# Patient Record
Sex: Female | Born: 1935 | Race: White | Hispanic: No | Marital: Single | Smoking: Former smoker
Health system: Southern US, Community
[De-identification: ages and names within clinical notes are randomized; demographics above are authoritative.]

## PROBLEM LIST (undated history)

## (undated) DIAGNOSIS — J302 Other seasonal allergic rhinitis: Secondary | ICD-10-CM

## (undated) DIAGNOSIS — E119 Type 2 diabetes mellitus without complications: Secondary | ICD-10-CM

## (undated) DIAGNOSIS — Z8489 Family history of other specified conditions: Secondary | ICD-10-CM

## (undated) DIAGNOSIS — M1990 Unspecified osteoarthritis, unspecified site: Secondary | ICD-10-CM

## (undated) DIAGNOSIS — I4892 Unspecified atrial flutter: Secondary | ICD-10-CM

## (undated) DIAGNOSIS — I38 Endocarditis, valve unspecified: Secondary | ICD-10-CM

## (undated) DIAGNOSIS — K219 Gastro-esophageal reflux disease without esophagitis: Secondary | ICD-10-CM

## (undated) DIAGNOSIS — I10 Essential (primary) hypertension: Secondary | ICD-10-CM

## (undated) DIAGNOSIS — E7800 Pure hypercholesterolemia, unspecified: Secondary | ICD-10-CM

## (undated) DIAGNOSIS — R198 Other specified symptoms and signs involving the digestive system and abdomen: Secondary | ICD-10-CM

## (undated) DIAGNOSIS — R0600 Dyspnea, unspecified: Secondary | ICD-10-CM

## (undated) DIAGNOSIS — M8580 Other specified disorders of bone density and structure, unspecified site: Secondary | ICD-10-CM

## (undated) HISTORY — PX: CHOLECYSTECTOMY: SHX55

## (undated) HISTORY — PX: LAPAROSCOPIC CHOLECYSTECTOMY: SUR755

## (undated) HISTORY — PX: TONSILLECTOMY: SUR1361

---
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
## 2013-01-07 ENCOUNTER — Encounter (HOSPITAL_COMMUNITY): Payer: Self-pay | Admitting: Pharmacy Technician

## 2013-01-07 NOTE — Patient Instructions (Addendum)
Lori David  01/08/2013   Your procedure is scheduled on:  01/13/13  Report to [facility] at 07:30 AM.  Call this number if you have problems the morning of surgery: [phone]   Remember:   Do not eat food or drink liquids after midnight.   Take these medicines the morning of surgery with A SIP OF WATER: Fexofenadine, Ranitidine and Valsartan-HCTZ.   Do not wear jewelry, make-up or nail polish.  Do not wear lotions, powders, or perfumes. You may wear deodorant.  Do not shave 48 hours prior to surgery. Men may shave face and neck.  Do not bring valuables to the hospital.  [facility] is not responsible for any belongings or valuables.               Contacts, dentures or bridgework may not be worn into surgery.  Leave suitcase in the car. After surgery it may be brought to your room.  For patients admitted to the hospital, discharge time is determined by your treatment team.               Patients discharged the day of surgery will not be allowed to drive home.   Special Instructions: Start using your eye drops prior to surgery as directed by your eye doctor.   Please read over the following fact sheets that you were given: Anesthesia Post-op Instructions     Cataract Surgery  A cataract is a clouding of the lens of the eye. When a lens becomes cloudy, vision is reduced based on the degree and nature of the clouding. Surgery may be needed to improve vision. Surgery removes the cloudy lens and usually replaces it with a substitute lens (intraocular lens, IOL). LET YOUR EYE DOCTOR KNOW ABOUT:  Allergies to food or medicine.  Medicines taken including herbs, eyedrops, over-the-counter medicines, and creams.  Use of steroids (by mouth or creams).  Previous problems with anesthetics or numbing medicine.  History of bleeding problems or blood clots.  Previous surgery.  Other health problems, including diabetes and kidney problems.  Possibility of pregnancy, if this  applies. RISKS AND COMPLICATIONS  Infection.  Inflammation of the eyeball (endophthalmitis) that can spread to both eyes (sympathetic ophthalmia).  Poor wound healing.  If an IOL is inserted, it can later fall out of proper position. This is very uncommon.  Clouding of the part of your eye that holds an IOL in place. This is called an "after-cataract." These are uncommon, but easily treated. BEFORE THE PROCEDURE  Do not eat or drink anything except small amounts of water for 8 to 12 before your surgery, or as directed by your caregiver.  Unless you are told otherwise, continue any eyedrops you have been prescribed.  Talk to your primary caregiver about all other medicines that you take (both prescription and non-prescription). In some cases, you may need to stop or change medicines near the time of your surgery. This is most important if you are taking blood-thinning medicine.Do not stop medicines unless you are told to do so.  Arrange for someone to drive you to and from the procedure.  Do not put contact lenses in either eye on the day of your surgery. PROCEDURE There is more than one method for safely removing a cataract. Your doctor can explain the differences and help determine which is best for you. Phacoemulsification surgery is the most common form of cataract surgery.  An injection is given behind the eye or eyedrops are given to make this  a painless procedure.  A small cut (incision) is made on the edge of the clear, dome-shaped surface that covers the front of the eye (cornea).  A tiny probe is painlessly inserted into the eye. This device gives off ultrasound waves that soften and break up the cloudy center of the lens. This makes it easier for the cloudy lens to be removed by suction.  An IOL may be implanted.  The normal lens of the eye is covered by a clear capsule. Part of that capsule is intentionally left in the eye to support the IOL.  Your surgeon may or may  not use stitches to close the incision. There are other forms of cataract surgery that require a larger incision and stiches to close the eye. This approach is taken in cases where the doctor feels that the cataract cannot be easily removed using phacoemulsification. AFTER THE PROCEDURE  When an IOL is implanted, it does not need care. It becomes a permanent part of your eye and cannot be seen or felt.  Your doctor will schedule follow-up exams to check on your progress.  Review your other medicines with your doctor to see which can be resumed after surgery.  Use eyedrops or take medicine as prescribed by your doctor. Document Released: 01/25/2011 Document Revised: 04/30/2011 Document Reviewed: 01/25/2011 [facility] Patient Information 2014 [place], [place].   PATIENT INSTRUCTIONS POST-ANESTHESIA  IMMEDIATELY FOLLOWING SURGERY:  Do not drive or operate machinery for the first twenty four hours after surgery.  Do not make any important decisions for twenty four hours after surgery or while taking narcotic pain medications or sedatives.  If you develop intractable nausea and vomiting or a severe headache please notify your doctor immediately.  FOLLOW-UP:  Please make an appointment with your surgeon as instructed. You do not need to follow up with anesthesia unless specifically instructed to do so.  WOUND CARE INSTRUCTIONS (if applicable):  Keep a dry clean dressing on the anesthesia/puncture wound site if there is drainage.  Once the wound has quit draining you may leave it open to air.  Generally you should leave the bandage intact for twenty four hours unless there is drainage.  If the epidural site drains for more than 36-48 hours please call the anesthesia department.  QUESTIONS?:  Please feel free to call your physician or the hospital operator if you have any questions, and they will be happy to assist you.

## 2013-01-08 ENCOUNTER — Encounter (HOSPITAL_COMMUNITY): Payer: Self-pay

## 2013-01-08 ENCOUNTER — Encounter (HOSPITAL_COMMUNITY)
Admission: RE | Admit: 2013-01-08 | Discharge: 2013-01-08 | Disposition: A | Discharge status: Home / Self Care | Payer: Medicare Other | Source: Ambulatory Visit | Attending: Ophthalmology | Admitting: Ophthalmology

## 2013-01-08 DIAGNOSIS — Z01818 Encounter for other preprocedural examination: Secondary | ICD-10-CM | POA: Insufficient documentation

## 2013-01-08 DIAGNOSIS — Z01812 Encounter for preprocedural laboratory examination: Secondary | ICD-10-CM | POA: Insufficient documentation

## 2013-01-08 HISTORY — DX: Other seasonal allergic rhinitis: J30.2

## 2013-01-08 HISTORY — DX: Essential (primary) hypertension: I10

## 2013-01-08 HISTORY — DX: Gastro-esophageal reflux disease without esophagitis: K21.9

## 2013-01-08 LAB — CBC WITH DIFFERENTIAL/PLATELET
Basophils Relative: 2 % — ABNORMAL HIGH (ref 0–1)
Eosinophils Absolute: 0 10*3/uL (ref 0.0–0.7)
Eosinophils Relative: 1 % (ref 0–5)
Hemoglobin: 15.1 g/dL — ABNORMAL HIGH (ref 12.0–15.0)
Lymphs Abs: 1.2 10*3/uL (ref 0.7–4.0)
MCH: 29.5 pg (ref 26.0–34.0)
MCHC: 33.3 g/dL (ref 30.0–36.0)
Monocytes Relative: 10 % (ref 3–12)
Neutro Abs: 2.9 10*3/uL (ref 1.7–7.7)
Neutrophils Relative %: 62 % (ref 43–77)
RBC: 5.12 MIL/uL — ABNORMAL HIGH (ref 3.87–5.11)
RDW: 13.3 % (ref 11.5–15.5)

## 2013-01-08 LAB — BASIC METABOLIC PANEL
BUN: 7 mg/dL (ref 6–23)
Calcium: 10.2 mg/dL (ref 8.4–10.5)
Creatinine, Ser: 0.81 mg/dL (ref 0.50–1.10)
GFR calc Af Amer: 80 mL/min — ABNORMAL LOW (ref >90)
GFR calc non Af Amer: 69 mL/min — ABNORMAL LOW (ref >90)
Glucose, Bld: 80 mg/dL (ref 70–99)
Potassium: 4.3 mEq/L (ref 3.5–5.1)

## 2013-01-08 NOTE — Progress Notes (Signed)
Pt came in for pre-op appt for EKG showed Atrial Flutter, EKG shows HR of 148. HR done manually and was irregular and between 70-80 bpm. Pt is asymptomatic, no dizziness, lightheadedness, heart palpitations. Pt's BP 128/85, HR 75, O2 sat 98%. Dr. Jayme Cloud notified and said to cancel surgery and to let pt's PCP and Dr. Nile Riggs aware that pt needs urgent cardiology appt. Dr. Bonnita Levan office called, I spoke with Denman George, med tech (there aren't any nurses within his office) and notified her of pt's situation and the need for pt to be seen by Dr. Sherril Croon and cardiology urgently. Pt's EKG was faxed to Dr. Sherril Croon' office. Dr. Ashley Royalty office called and a message was left for the surgery scheduler about surgery being canceled and the pt's situation.

## 2013-01-13 ENCOUNTER — Encounter (HOSPITAL_COMMUNITY): Admission: RE | Payer: Self-pay | Source: Ambulatory Visit

## 2013-01-13 ENCOUNTER — Ambulatory Visit (HOSPITAL_COMMUNITY): Admission: RE | Admit: 2013-01-13 | Payer: Medicare Other | Source: Ambulatory Visit | Admitting: Ophthalmology

## 2013-01-13 SURGERY — PHACOEMULSIFICATION, CATARACT, WITH IOL INSERTION
Anesthesia: Monitor Anesthesia Care | Laterality: Right

## 2013-01-20 ENCOUNTER — Other Ambulatory Visit (HOSPITAL_COMMUNITY): Payer: Self-pay

## 2013-01-27 ENCOUNTER — Encounter (HOSPITAL_COMMUNITY): Admission: RE | Payer: Self-pay | Source: Ambulatory Visit

## 2013-01-27 ENCOUNTER — Ambulatory Visit (HOSPITAL_COMMUNITY): Admission: RE | Admit: 2013-01-27 | Payer: Medicare Other | Source: Ambulatory Visit | Admitting: Ophthalmology

## 2013-01-27 SURGERY — PHACOEMULSIFICATION, CATARACT, WITH IOL INSERTION
Anesthesia: Monitor Anesthesia Care | Laterality: Left

## 2013-02-19 HISTORY — PX: CATARACT EXTRACTION W/ INTRAOCULAR LENS  IMPLANT, BILATERAL: SHX1307

## 2013-03-09 DIAGNOSIS — I10 Essential (primary) hypertension: Secondary | ICD-10-CM | POA: Insufficient documentation

## 2013-03-09 DIAGNOSIS — I4892 Unspecified atrial flutter: Secondary | ICD-10-CM | POA: Insufficient documentation

## 2013-03-09 DIAGNOSIS — I429 Cardiomyopathy, unspecified: Secondary | ICD-10-CM | POA: Insufficient documentation

## 2013-06-22 NOTE — Patient Instructions (Signed)
Lori David  06/22/2013   Your procedure is scheduled on:  06/30/2013  Report to [facility] at 6:30 AM.  Call this number if you have problems the morning of surgery: [phone]   Remember:   Do not eat food or drink liquids after midnight.   Take these medicines the morning of surgery with A SIP OF WATER: Allegra, Valsartan, Zantac   Do not wear jewelry, make-up or nail polish.  Do not wear lotions, powders, or perfumes. You may wear deodorant.  Do not shave 48 hours prior to surgery. Men may shave face and neck.  Do not bring valuables to the hospital.  [facility] is not responsible for any belongings or valuables.               Contacts, dentures or bridgework may not be worn into surgery.  Leave suitcase in the car. After surgery it may be brought to your room.  For patients admitted to the hospital, discharge time is determined by your treatment team.               Patients discharged the day of surgery will not be allowed to drive home.  Name and phone number of your driver:   Special Instructions: Shower using CHG 2 nights before surgery and the night before surgery.  If you shower the day of surgery use CHG.  Use special wash - you have one bottle of CHG for all showers.  You should use approximately 1/3 of the bottle for each shower.   Please read over the following fact sheets that you were given: Pain Booklet, Coughing and Deep Breathing, Surgical Site Infection Prevention, Anesthesia Post-op Instructions and Care and Recovery After Surgery  General Anesthesia, Adult, Care After Refer to this sheet in the next few weeks. These instructions provide you with information on caring for yourself after your procedure. Your health care provider may also give you more specific instructions. Your treatment has been planned according to current medical practices, but problems sometimes occur. Call your health care provider if you have any problems or questions after your  procedure. WHAT TO EXPECT AFTER THE PROCEDURE After the procedure, it is typical to experience:  Sleepiness.  Nausea and vomiting. HOME CARE INSTRUCTIONS  For the first 24 hours after general anesthesia:  Have a responsible person with you.  Do not drive a car. If you are alone, do not take public transportation.  Do not drink alcohol.  Do not take medicine that has not been prescribed by your health care provider.  Do not sign important papers or make important decisions.  You may resume a normal diet and activities as directed by your health care provider.  Change bandages (dressings) as directed.  If you have questions or problems that seem related to general anesthesia, call the hospital and ask for the anesthetist or anesthesiologist on call. SEEK MEDICAL CARE IF:  You have nausea and vomiting that continue the day after anesthesia.  You develop a rash. SEEK IMMEDIATE MEDICAL CARE IF:   You have difficulty breathing.  You have chest pain.  You have any allergic problems. Document Released: 05/14/2000 Document Revised: 10/08/2012 Document Reviewed: 08/21/2012 [facility] Patient Information 2014 [place], [place].  Lori David  06/22/2013           [place] Instructions [place] [address]      1. Avoid closing eyes tightly. One often closes the eye tightly when laughing, talking, sneezing, coughing  or if they feel irritated. At these times, you should be careful not to close your eyes tightly.  2. Instill eye drops as instructed. To instill drops in your eye, open it, look up and have someone gently pull the lower lid down and instill a couple of drops inside the lower lid.  3. Do not touch upper lid.  4. Take Advil or Tylenol for pain.  5. You may use either eye for near work, such as reading or sewing and you may watch television.  6. You may have your hair done at the beauty parlor at any time.  7. Wear  dark glasses with or without your own glasses if you are in bright light.  8. Call our office at [phone] or [phone] if you have sharp pain in your eye or unusual symptoms.  9. Do not be concerned because vision in the operative eye is not good. It will not be good, no matter how successful the operation, until you get a special lens for it. Your old glasses will not be suited to the new eye that was operated on and you will not be ready for a new lens for about a month.  10. Follow up at the [facility] office.    I have received a copy of the above instructions and will follow them.

## 2013-06-23 ENCOUNTER — Encounter (HOSPITAL_COMMUNITY)
Admission: RE | Admit: 2013-06-23 | Discharge: 2013-06-23 | Disposition: A | Discharge status: Home / Self Care | Payer: Medicare HMO | Source: Ambulatory Visit | Attending: Ophthalmology | Admitting: Ophthalmology

## 2013-06-23 ENCOUNTER — Other Ambulatory Visit: Payer: Self-pay

## 2013-06-23 ENCOUNTER — Encounter (HOSPITAL_COMMUNITY): Payer: Self-pay

## 2013-06-23 DIAGNOSIS — Z01812 Encounter for preprocedural laboratory examination: Secondary | ICD-10-CM | POA: Insufficient documentation

## 2013-06-23 DIAGNOSIS — Z01810 Encounter for preprocedural cardiovascular examination: Secondary | ICD-10-CM | POA: Insufficient documentation

## 2013-06-23 HISTORY — DX: Endocarditis, valve unspecified: I38

## 2013-06-23 LAB — CBC WITH DIFFERENTIAL/PLATELET
Basophils Absolute: 0.1 10*3/uL (ref 0.0–0.1)
Basophils Relative: 1 % (ref 0–1)
EOS ABS: 0.1 10*3/uL (ref 0.0–0.7)
Eosinophils Relative: 2 % (ref 0–5)
HEMATOCRIT: 41.8 % (ref 36.0–46.0)
Hemoglobin: 14.4 g/dL (ref 12.0–15.0)
LYMPHS ABS: 1.2 10*3/uL (ref 0.7–4.0)
LYMPHS PCT: 21 % (ref 12–46)
MCH: 30.6 pg (ref 26.0–34.0)
MCHC: 34.4 g/dL (ref 30.0–36.0)
MCV: 88.7 fL (ref 78.0–100.0)
MONO ABS: 0.6 10*3/uL (ref 0.1–1.0)
MONOS PCT: 10 % (ref 3–12)
Neutro Abs: 3.7 10*3/uL (ref 1.7–7.7)
Neutrophils Relative %: 66 % (ref 43–77)
Platelets: 237 10*3/uL (ref 150–400)
RBC: 4.71 MIL/uL (ref 3.87–5.11)
RDW: 13.1 % (ref 11.5–15.5)
WBC: 5.7 10*3/uL (ref 4.0–10.5)

## 2013-06-23 LAB — BASIC METABOLIC PANEL
BUN: 9 mg/dL (ref 6–23)
CALCIUM: 9.7 mg/dL (ref 8.4–10.5)
CO2: 28 meq/L (ref 19–32)
CREATININE: 0.73 mg/dL (ref 0.50–1.10)
Chloride: 101 mEq/L (ref 96–112)
GFR calc Af Amer: >90 mL/min (ref >90)
GFR, EST NON AFRICAN AMERICAN: 81 mL/min — AB (ref >90)
GLUCOSE: 82 mg/dL (ref 70–99)
Potassium: 4.7 mEq/L (ref 3.7–5.3)
SODIUM: 139 meq/L (ref 137–147)

## 2013-06-25 ENCOUNTER — Other Ambulatory Visit (HOSPITAL_COMMUNITY): Payer: Medicare Other

## 2013-06-25 ENCOUNTER — Encounter (HOSPITAL_COMMUNITY): Payer: Self-pay | Admitting: Pharmacy Technician

## 2013-06-30 ENCOUNTER — Encounter (HOSPITAL_COMMUNITY): Payer: Self-pay

## 2013-06-30 ENCOUNTER — Encounter (HOSPITAL_COMMUNITY)
Admission: RE | Disposition: A | Discharge status: Home / Self Care | Payer: Self-pay | Source: Ambulatory Visit | Attending: Ophthalmology

## 2013-06-30 ENCOUNTER — Ambulatory Visit (HOSPITAL_COMMUNITY)
Admission: RE | Admit: 2013-06-30 | Discharge: 2013-06-30 | Disposition: A | Discharge status: Home / Self Care | Payer: Medicare HMO | Source: Ambulatory Visit | Attending: Ophthalmology | Admitting: Ophthalmology

## 2013-06-30 ENCOUNTER — Ambulatory Visit (HOSPITAL_COMMUNITY): Payer: Medicare HMO | Admitting: Anesthesiology

## 2013-06-30 ENCOUNTER — Encounter (HOSPITAL_COMMUNITY): Payer: Medicare HMO | Admitting: Anesthesiology

## 2013-06-30 DIAGNOSIS — H2510 Age-related nuclear cataract, unspecified eye: Secondary | ICD-10-CM | POA: Insufficient documentation

## 2013-06-30 DIAGNOSIS — I10 Essential (primary) hypertension: Secondary | ICD-10-CM | POA: Insufficient documentation

## 2013-06-30 DIAGNOSIS — Z79899 Other long term (current) drug therapy: Secondary | ICD-10-CM | POA: Insufficient documentation

## 2013-06-30 HISTORY — PX: CATARACT EXTRACTION W/PHACO: SHX586

## 2013-06-30 SURGERY — PHACOEMULSIFICATION, CATARACT, WITH IOL INSERTION
Anesthesia: Monitor Anesthesia Care | Site: Eye | Laterality: Right

## 2013-06-30 MED ORDER — CYCLOPENTOLATE-PHENYLEPHRINE OP SOLN OPTIME - NO CHARGE
OPHTHALMIC | Status: AC
  Filled 2013-06-30: qty 2

## 2013-06-30 MED ORDER — TETRACAINE 0.5 % OP SOLN OPTIME - NO CHARGE
OPHTHALMIC | Status: DC | PRN
  Administered 2013-06-30: 1 [drp] via OPHTHALMIC

## 2013-06-30 MED ORDER — KETOROLAC TROMETHAMINE 0.5 % OP SOLN
OPHTHALMIC | Status: AC
  Filled 2013-06-30: qty 5

## 2013-06-30 MED ORDER — TETRACAINE HCL 0.5 % OP SOLN
1.0000 [drp] | OPHTHALMIC | Status: AC
  Administered 2013-06-30 (×3): 1 [drp] via OPHTHALMIC

## 2013-06-30 MED ORDER — PROVISC 10 MG/ML IO SOLN
INTRAOCULAR | Status: DC | PRN
  Administered 2013-06-30: 0.85 mL via INTRAOCULAR

## 2013-06-30 MED ORDER — EPINEPHRINE HCL 1 MG/ML IJ SOLN
INTRAMUSCULAR | Status: AC
  Filled 2013-06-30: qty 1

## 2013-06-30 MED ORDER — KETOROLAC TROMETHAMINE 0.5 % OP SOLN
1.0000 [drp] | OPHTHALMIC | Status: AC
  Administered 2013-06-30 (×3): 1 [drp] via OPHTHALMIC

## 2013-06-30 MED ORDER — MIDAZOLAM HCL 2 MG/2ML IJ SOLN
1.0000 mg | INTRAMUSCULAR | Status: DC | PRN
  Administered 2013-06-30: 2 mg via INTRAVENOUS

## 2013-06-30 MED ORDER — LACTATED RINGERS IV SOLN
INTRAVENOUS | Status: DC
  Administered 2013-06-30: 07:00:00 via INTRAVENOUS

## 2013-06-30 MED ORDER — FENTANYL CITRATE 0.05 MG/ML IJ SOLN
INTRAMUSCULAR | Status: AC
  Filled 2013-06-30: qty 2

## 2013-06-30 MED ORDER — TETRACAINE HCL 0.5 % OP SOLN
OPHTHALMIC | Status: AC
  Filled 2013-06-30: qty 2

## 2013-06-30 MED ORDER — FENTANYL CITRATE 0.05 MG/ML IJ SOLN
25.0000 ug | INTRAMUSCULAR | Status: AC
  Administered 2013-06-30: 25 ug via INTRAVENOUS

## 2013-06-30 MED ORDER — PHENYLEPHRINE HCL 2.5 % OP SOLN
1.0000 [drp] | OPHTHALMIC | Status: AC
  Administered 2013-06-30 (×3): 1 [drp] via OPHTHALMIC

## 2013-06-30 MED ORDER — MIDAZOLAM HCL 2 MG/2ML IJ SOLN
INTRAMUSCULAR | Status: AC
  Filled 2013-06-30: qty 2

## 2013-06-30 MED ORDER — EPINEPHRINE HCL 1 MG/ML IJ SOLN
INTRAOCULAR | Status: DC | PRN
  Administered 2013-06-30: 08:00:00

## 2013-06-30 MED ORDER — PHENYLEPHRINE HCL 2.5 % OP SOLN
OPHTHALMIC | Status: AC
  Filled 2013-06-30: qty 15

## 2013-06-30 MED ORDER — BSS IO SOLN
INTRAOCULAR | Status: DC | PRN
  Administered 2013-06-30: 15 mL

## 2013-06-30 MED ORDER — LACTATED RINGERS IV SOLN
INTRAVENOUS | Status: DC | PRN
  Administered 2013-06-30: 07:00:00 via INTRAVENOUS

## 2013-06-30 MED ORDER — CYCLOPENTOLATE-PHENYLEPHRINE 0.2-1 % OP SOLN
1.0000 [drp] | OPHTHALMIC | Status: AC
  Administered 2013-06-30 (×3): 1 [drp] via OPHTHALMIC

## 2013-06-30 SURGICAL SUPPLY — 24 items
CAPSULAR TENSION RING-AMO (OPHTHALMIC RELATED) IMPLANT
CLOTH BEACON ORANGE TIMEOUT ST (SAFETY) ×3 IMPLANT
EYE SHIELD UNIVERSAL CLEAR (GAUZE/BANDAGES/DRESSINGS) ×3 IMPLANT
GLOVE BIO SURGEON STRL SZ 6.5 (GLOVE) ×2 IMPLANT
GLOVE BIO SURGEONS STRL SZ 6.5 (GLOVE) ×1
GLOVE ECLIPSE 6.5 STRL STRAW (GLOVE) IMPLANT
GLOVE ECLIPSE 7.0 STRL STRAW (GLOVE) ×3 IMPLANT
GLOVE EXAM NITRILE LRG STRL (GLOVE) IMPLANT
GLOVE EXAM NITRILE MD LF STRL (GLOVE) IMPLANT
GLOVE SKINSENSE NS SZ6.5 (GLOVE)
GLOVE SKINSENSE STRL SZ6.5 (GLOVE) IMPLANT
HEALON 5 0.6 ML (INTRAOCULAR LENS) IMPLANT
KIT VITRECTOMY (OPHTHALMIC RELATED) IMPLANT
PAD ARMBOARD 7.5X6 YLW CONV (MISCELLANEOUS) ×3 IMPLANT
PROC W NO LENS (INTRAOCULAR LENS)
PROC W SPEC LENS (INTRAOCULAR LENS)
PROCESS W NO LENS (INTRAOCULAR LENS) IMPLANT
PROCESS W SPEC LENS (INTRAOCULAR LENS) IMPLANT
RING MALYGIN (MISCELLANEOUS) IMPLANT
SIGHTPATH CAT PROC W REG LENS (Ophthalmic Related) ×3 IMPLANT
TAPE SURG TRANSPORE 1 IN (GAUZE/BANDAGES/DRESSINGS) ×1 IMPLANT
TAPE SURGICAL TRANSPORE 1 IN (GAUZE/BANDAGES/DRESSINGS) ×2
VISCOELASTIC ADDITIONAL (OPHTHALMIC RELATED) IMPLANT
WATER STERILE IRR 250ML POUR (IV SOLUTION) ×3 IMPLANT

## 2013-06-30 NOTE — Anesthesia Preprocedure Evaluation (Signed)
Anesthesia Evaluation  Patient identified by MRN, date of birth, ID band Patient awake    Reviewed: Allergy & Precautions, H&P , NPO status , Patient's Chart, lab work & pertinent test results  Airway Mallampati: IV TM Distance: <3 FB   Mouth opening: Limited Mouth Opening  Dental  (+) Teeth Intact   Pulmonary former smoker,  breath sounds clear to auscultation        Cardiovascular hypertension, Pt. on medications Rhythm:Regular Rate:Normal     Neuro/Psych    GI/Hepatic GERD-  Medicated,  Endo/Other    Renal/GU      Musculoskeletal   Abdominal   Peds  Hematology   Anesthesia Other Findings   Reproductive/Obstetrics                           Anesthesia Physical Anesthesia Plan  ASA: II  Anesthesia Plan: MAC   Post-op Pain Management:    Induction: Intravenous  Airway Management Planned: Nasal Cannula  Additional Equipment:   Intra-op Plan:   Post-operative Plan:   Informed Consent: I have reviewed the patients History and Physical, chart, labs and discussed the procedure including the risks, benefits and alternatives for the proposed anesthesia with the patient or authorized representative who has indicated his/her understanding and acceptance.     Plan Discussed with:   Anesthesia Plan Comments:         Anesthesia Quick Evaluation

## 2013-06-30 NOTE — Anesthesia Procedure Notes (Signed)
Procedure Name: MAC Date/Time: 06/30/2013 7:49 AM Performed by: Andree Elk, Uchechukwu Dhawan A Pre-anesthesia Checklist: Patient identified, Timeout performed, Emergency Drugs available, Suction available and Patient being monitored Oxygen Delivery Method: Nasal cannula

## 2013-06-30 NOTE — Discharge Instructions (Signed)
Lori David  06/30/2013           [place] Instructions [place] [address]-[place]      1. Avoid closing eyes tightly. One often closes the eye tightly when laughing, talking, sneezing, coughing or if they feel irritated. At these times, you should be careful not to close your eyes tightly.  2. Instill eye drops as instructed. To instill drops in your eye, open it, look up and have someone gently pull the lower lid down and instill a couple of drops inside the lower lid.  3. Do not touch upper lid.  4. Take Advil or Tylenol for pain.  5. You may use either eye for near work, such as reading or sewing and you may watch television.  6. You may have your hair done at the beauty parlor at any time.  7. Wear dark glasses with or without your own glasses if you are in bright light.  8. Call our office at [phone] or [phone] if you have sharp pain in your eye or unusual symptoms.  9. Do not be concerned because vision in the operative eye is not good. It will not be good, no matter how successful the operation, until you get a special lens for it. Your old glasses will not be suited to the new eye that was operated on and you will not be ready for a new lens for about a month.  10. Follow up at the [facility] office.    I have received a copy of the above instructions and will follow them.

## 2013-06-30 NOTE — Anesthesia Postprocedure Evaluation (Signed)
  Anesthesia Post-op Note  Patient: Lori David  Procedure(s) Performed: Procedure(s) with comments: CATARACT EXTRACTION PHACO AND INTRAOCULAR LENS PLACEMENT (IOC) (Right) - CDE:7.99  Patient Location: Short Stay  Anesthesia Type:MAC  Level of Consciousness: awake, alert , oriented and patient cooperative  Airway and Oxygen Therapy: Patient Spontanous Breathing  Post-op Pain: none  Post-op Assessment: Post-op Vital signs reviewed, Patient's Cardiovascular Status Stable, Respiratory Function Stable, Patent Airway, No signs of Nausea or vomiting and Pain level controlled  Post-op Vital Signs: Reviewed and stable  Last Vitals:  Filed Vitals:   06/30/13 0745  BP: 111/53  Pulse:   Temp:   Resp: 16    Complications: No apparent anesthesia complications

## 2013-06-30 NOTE — Op Note (Signed)
Patient brought to the operating room and prepped and draped in the usual manner.  Lid speculum inserted in right eye.  Stab incision made at the twelve o'clock position.  Provisc instilled in the anterior chamber.   A 2.4 mm. Stab incision was made temporally.  An anterior capsulotomy was done with a bent 25 gauge needle.  The nucleus was hydrodissected.  The Phaco tip was inserted in the anterior chamber and the nucleus was emulsified.  CDE was 7.99.  The cortical material was then removed with the I and A tip.  Posterior capsule was the polished.  The anterior chamber was deepened with Provisc.  A 21.5 Diopter Alcon SN60WF IOL was then inserted in the capsular bag.  Provisc was then removed with the I and A tip.  The wound was then hydrated.  Patient sent to the Recovery Room in good condition with follow up in my office.  Preoperative Diagnosis:  Nuclear Cataract OD Postoperative Diagnosis:  Same Procedure name: Kelman Phacoemulsification OD with IOL

## 2013-06-30 NOTE — Transfer of Care (Signed)
Immediate Anesthesia Transfer of Care Note  Patient: Lori David  Procedure(s) Performed: Procedure(s) with comments: CATARACT EXTRACTION PHACO AND INTRAOCULAR LENS PLACEMENT (IOC) (Right) - CDE:7.99  Patient Location: Short Stay  Anesthesia Type:MAC  Level of Consciousness: awake, alert , oriented and patient cooperative  Airway & Oxygen Therapy: Patient Spontanous Breathing  Post-op Assessment: Report given to PACU RN and Post -op Vital signs reviewed and stable  Post vital signs: Reviewed and stable  Complications: No apparent anesthesia complications

## 2013-06-30 NOTE — H&P (Signed)
The patient was re examined and there is no change in the patients condition since the original H and P. 

## 2013-07-01 ENCOUNTER — Encounter (HOSPITAL_COMMUNITY): Payer: Self-pay | Admitting: Ophthalmology

## 2013-07-07 MED ORDER — ONDANSETRON HCL 4 MG/2ML IJ SOLN: 4.0000 mg | Freq: Once | INTRAMUSCULAR | Status: AC | PRN

## 2013-07-07 MED ORDER — FENTANYL CITRATE 0.05 MG/ML IJ SOLN: 25.0000 ug | INTRAMUSCULAR | Status: DC | PRN

## 2013-07-08 ENCOUNTER — Encounter (HOSPITAL_COMMUNITY): Payer: Medicare HMO | Attending: Ophthalmology

## 2013-07-08 ENCOUNTER — Encounter (HOSPITAL_COMMUNITY): Payer: Self-pay | Admitting: *Deleted

## 2013-07-14 ENCOUNTER — Encounter (HOSPITAL_COMMUNITY): Payer: Self-pay | Admitting: *Deleted

## 2013-07-14 ENCOUNTER — Ambulatory Visit (HOSPITAL_COMMUNITY): Payer: Medicare HMO | Admitting: Anesthesiology

## 2013-07-14 ENCOUNTER — Encounter (HOSPITAL_COMMUNITY): Payer: Medicare HMO | Admitting: Anesthesiology

## 2013-07-14 ENCOUNTER — Encounter (HOSPITAL_COMMUNITY)
Admission: RE | Disposition: A | Discharge status: Home / Self Care | Payer: Self-pay | Source: Ambulatory Visit | Attending: Ophthalmology

## 2013-07-14 ENCOUNTER — Ambulatory Visit (HOSPITAL_COMMUNITY)
Admission: RE | Admit: 2013-07-14 | Discharge: 2013-07-14 | Disposition: A | Discharge status: Home / Self Care | Payer: Medicare HMO | Source: Ambulatory Visit | Attending: Ophthalmology | Admitting: Ophthalmology

## 2013-07-14 DIAGNOSIS — H2589 Other age-related cataract: Secondary | ICD-10-CM | POA: Insufficient documentation

## 2013-07-14 HISTORY — PX: CATARACT EXTRACTION W/PHACO: SHX586

## 2013-07-14 SURGERY — PHACOEMULSIFICATION, CATARACT, WITH IOL INSERTION
Anesthesia: Monitor Anesthesia Care | Site: Eye | Laterality: Left

## 2013-07-14 MED ORDER — PROVISC 10 MG/ML IO SOLN
INTRAOCULAR | Status: DC | PRN
  Administered 2013-07-14: 0.85 mL via INTRAOCULAR

## 2013-07-14 MED ORDER — FENTANYL CITRATE 0.05 MG/ML IJ SOLN: 25.0000 ug | INTRAMUSCULAR | Status: DC | PRN

## 2013-07-14 MED ORDER — PHENYLEPHRINE HCL 2.5 % OP SOLN
1.0000 [drp] | OPHTHALMIC | Status: AC | PRN
  Administered 2013-07-14 (×3): 1 [drp] via OPHTHALMIC

## 2013-07-14 MED ORDER — LIDOCAINE HCL (PF) 1 % IJ SOLN
INTRAMUSCULAR | Status: AC
  Filled 2013-07-14: qty 2

## 2013-07-14 MED ORDER — MIDAZOLAM HCL 2 MG/2ML IJ SOLN
INTRAMUSCULAR | Status: AC
  Filled 2013-07-14: qty 2

## 2013-07-14 MED ORDER — MIDAZOLAM HCL 2 MG/2ML IJ SOLN
1.0000 mg | INTRAMUSCULAR | Status: DC | PRN
  Administered 2013-07-14: 2 mg via INTRAVENOUS

## 2013-07-14 MED ORDER — CYCLOPENTOLATE-PHENYLEPHRINE 0.2-1 % OP SOLN
1.0000 [drp] | OPHTHALMIC | Status: AC | PRN
  Administered 2013-07-14 (×3): 1 [drp] via OPHTHALMIC

## 2013-07-14 MED ORDER — PHENYLEPHRINE HCL 2.5 % OP SOLN
OPHTHALMIC | Status: AC
  Filled 2013-07-14: qty 15

## 2013-07-14 MED ORDER — FENTANYL CITRATE 0.05 MG/ML IJ SOLN
25.0000 ug | INTRAMUSCULAR | Status: AC
  Administered 2013-07-14 (×2): 25 ug via INTRAVENOUS

## 2013-07-14 MED ORDER — CYCLOPENTOLATE-PHENYLEPHRINE OP SOLN OPTIME - NO CHARGE
OPHTHALMIC | Status: AC
  Filled 2013-07-14: qty 2

## 2013-07-14 MED ORDER — FENTANYL CITRATE 0.05 MG/ML IJ SOLN
INTRAMUSCULAR | Status: AC
  Filled 2013-07-14: qty 2

## 2013-07-14 MED ORDER — EPINEPHRINE HCL 1 MG/ML IJ SOLN
INTRAMUSCULAR | Status: AC
Start: 2013-07-14 — End: 2013-07-14
  Filled 2013-07-14: qty 1

## 2013-07-14 MED ORDER — BSS IO SOLN
INTRAOCULAR | Status: DC | PRN
  Administered 2013-07-14: 15 mL via INTRAOCULAR

## 2013-07-14 MED ORDER — EPINEPHRINE HCL 1 MG/ML IJ SOLN
INTRAOCULAR | Status: DC | PRN
  Administered 2013-07-14: 08:00:00

## 2013-07-14 MED ORDER — LACTATED RINGERS IV SOLN
INTRAVENOUS | Status: DC
  Administered 2013-07-14: 1000 mL via INTRAVENOUS

## 2013-07-14 MED ORDER — TETRACAINE HCL 0.5 % OP SOLN
1.0000 [drp] | OPHTHALMIC | Status: AC | PRN
  Administered 2013-07-14 (×3): 1 [drp] via OPHTHALMIC

## 2013-07-14 MED ORDER — TETRACAINE 0.5 % OP SOLN OPTIME - NO CHARGE
OPHTHALMIC | Status: DC | PRN
  Administered 2013-07-14: 1 [drp] via OPHTHALMIC

## 2013-07-14 MED ORDER — KETOROLAC TROMETHAMINE 0.5 % OP SOLN
1.0000 [drp] | OPHTHALMIC | Status: AC | PRN
Start: 2013-07-14 — End: 2013-07-14
  Administered 2013-07-14 (×3): 1 [drp] via OPHTHALMIC

## 2013-07-14 MED ORDER — KETOROLAC TROMETHAMINE 0.5 % OP SOLN
OPHTHALMIC | Status: AC
  Filled 2013-07-14: qty 5

## 2013-07-14 MED ORDER — ONDANSETRON HCL 4 MG/2ML IJ SOLN: 4.0000 mg | Freq: Once | INTRAMUSCULAR | Status: DC | PRN

## 2013-07-14 MED ORDER — TETRACAINE HCL 0.5 % OP SOLN
OPHTHALMIC | Status: AC
  Filled 2013-07-14: qty 2

## 2013-07-14 SURGICAL SUPPLY — 23 items
CAPSULAR TENSION RING-AMO (OPHTHALMIC RELATED) IMPLANT
CLOTH BEACON ORANGE TIMEOUT ST (SAFETY) ×2 IMPLANT
EYE SHIELD UNIVERSAL CLEAR (GAUZE/BANDAGES/DRESSINGS) ×2 IMPLANT
GLOVE BIO SURGEON STRL SZ 6.5 (GLOVE) ×2 IMPLANT
GLOVE ECLIPSE 6.5 STRL STRAW (GLOVE) IMPLANT
GLOVE ECLIPSE 7.0 STRL STRAW (GLOVE) IMPLANT
GLOVE EXAM NITRILE LRG STRL (GLOVE) IMPLANT
GLOVE EXAM NITRILE MD LF STRL (GLOVE) ×2 IMPLANT
GLOVE SKINSENSE NS SZ6.5 (GLOVE)
GLOVE SKINSENSE STRL SZ6.5 (GLOVE) IMPLANT
HEALON 5 0.6 ML (INTRAOCULAR LENS) IMPLANT
KIT VITRECTOMY (OPHTHALMIC RELATED) IMPLANT
PAD ARMBOARD 7.5X6 YLW CONV (MISCELLANEOUS) ×2 IMPLANT
PROC W NO LENS (INTRAOCULAR LENS)
PROC W SPEC LENS (INTRAOCULAR LENS)
PROCESS W NO LENS (INTRAOCULAR LENS) IMPLANT
PROCESS W SPEC LENS (INTRAOCULAR LENS) IMPLANT
RING MALYGIN (MISCELLANEOUS) IMPLANT
SIGHTPATH CAT PROC W REG LENS (Ophthalmic Related) ×2 IMPLANT
TAPE SURG TRANSPORE 1 IN (GAUZE/BANDAGES/DRESSINGS) ×1 IMPLANT
TAPE SURGICAL TRANSPORE 1 IN (GAUZE/BANDAGES/DRESSINGS) ×1
VISCOELASTIC ADDITIONAL (OPHTHALMIC RELATED) IMPLANT
WATER STERILE IRR 250ML POUR (IV SOLUTION) ×2 IMPLANT

## 2013-07-14 NOTE — Anesthesia Postprocedure Evaluation (Signed)
  Anesthesia Post-op Note  Patient: Lori David  Procedure(s) Performed: Procedure(s) with comments: CATARACT EXTRACTION PHACO AND INTRAOCULAR LENS PLACEMENT (IOC) (Left) - CDE:  8.25  Patient Location: Short Stay  Anesthesia Type:MAC  Level of Consciousness: awake, alert  and oriented  Airway and Oxygen Therapy: Patient Spontanous Breathing  Post-op Pain: none  Post-op Assessment: Post-op Vital signs reviewed  Post-op Vital Signs: Reviewed and stable  Last Vitals:  Filed Vitals:   07/14/13 0700  BP: 184/86  Resp: 27    Complications: No apparent anesthesia complications

## 2013-07-14 NOTE — Op Note (Signed)
Patient brought to the operating room and prepped and draped in the usual manner.  Lid speculum inserted in left eye.  Stab incision made at the twelve o'clock position.  Provisc instilled in the anterior chamber.   A 2.4 mm. Stab incision was made temporally.  An anterior capsulotomy was done with a bent 25 gauge needle.  The nucleus was hydrodissected.  The Phaco tip was inserted in the anterior chamber and the nucleus was emulsified.  CDE was 8.25.  The cortical material was then removed with the I and A tip.  Posterior capsule was the polished.  The anterior chamber was deepened with Provisc.  A 22.0 Alcon SN60WF IOL was then inserted in the capsular bag.  Provisc was then removed with the I and A tip.  The wound was then hydrated.  Patient sent to the Recovery Room in good condition with follow up in my office.  Preoperative Diagnosis:  Nuclear Cataract OS Postoperative Diagnosis:  Same Procedure name: Kelman Phacoemulsification OS with IOL

## 2013-07-14 NOTE — Transfer of Care (Signed)
Immediate Anesthesia Transfer of Care Note  Patient: Lori David  Procedure(s) Performed: Procedure(s) with comments: CATARACT EXTRACTION PHACO AND INTRAOCULAR LENS PLACEMENT (IOC) (Left) - CDE:  8.25  Patient Location: Short Stay  Anesthesia Type:MAC  Level of Consciousness: awake  Airway & Oxygen Therapy: Patient Spontanous Breathing  Post-op Assessment: Report given to PACU RN  Post vital signs: Reviewed  Complications: No apparent anesthesia complications

## 2013-07-14 NOTE — H&P (Signed)
The patient was re examined and there is no change in the patients condition since the original H and P. 

## 2013-07-14 NOTE — Anesthesia Preprocedure Evaluation (Signed)
Anesthesia Evaluation  Patient identified by MRN, date of birth, ID band Patient awake    Reviewed: Allergy & Precautions, H&P , NPO status , Patient's Chart, lab work & pertinent test results  Airway Mallampati: IV TM Distance: <3 FB   Mouth opening: Limited Mouth Opening  Dental  (+) Teeth Intact   Pulmonary former smoker,  breath sounds clear to auscultation        Cardiovascular hypertension, Pt. on medications Rhythm:Regular Rate:Normal     Neuro/Psych    GI/Hepatic GERD-  Medicated,  Endo/Other    Renal/GU      Musculoskeletal   Abdominal   Peds  Hematology   Anesthesia Other Findings   Reproductive/Obstetrics                           Anesthesia Physical Anesthesia Plan  ASA: II  Anesthesia Plan: MAC   Post-op Pain Management:    Induction: Intravenous  Airway Management Planned: Nasal Cannula  Additional Equipment:   Intra-op Plan:   Post-operative Plan:   Informed Consent: I have reviewed the patients History and Physical, chart, labs and discussed the procedure including the risks, benefits and alternatives for the proposed anesthesia with the patient or authorized representative who has indicated his/her understanding and acceptance.     Plan Discussed with:   Anesthesia Plan Comments:         Anesthesia Quick Evaluation  

## 2013-07-14 NOTE — Discharge Instructions (Signed)
Adreana Coull  07/14/2013           [place] Instructions [place] [address]-[place]      1. Avoid closing eyes tightly. One often closes the eye tightly when laughing, talking, sneezing, coughing or if they feel irritated. At these times, you should be careful not to close your eyes tightly.  2. Instill eye drops as instructed. To instill drops in your eye, open it, look up and have someone gently pull the lower lid down and instill a couple of drops inside the lower lid.  3. Do not touch upper lid.  4. Take Advil or Tylenol for pain.  5. You may use either eye for near work, such as reading or sewing and you may watch television.  6. You may have your hair done at the beauty parlor at any time.  7. Wear dark glasses with or without your own glasses if you are in bright light.  8. Call our office at [phone] or [phone] if you have sharp pain in your eye or unusual symptoms.  9. Do not be concerned because vision in the operative eye is not good. It will not be good, no matter how successful the operation, until you get a special lens for it. Your old glasses will not be suited to the new eye that was operated on and you will not be ready for a new lens for about a month.  10. Follow up at the [facility] office.    I have received a copy of the above instructions and will follow them.     Follow up with Dr. Gershon Crane today between 2-3 pm

## 2013-07-15 ENCOUNTER — Encounter (HOSPITAL_COMMUNITY): Payer: Self-pay | Admitting: Ophthalmology

## 2013-11-26 ENCOUNTER — Encounter (INDEPENDENT_AMBULATORY_CARE_PROVIDER_SITE_OTHER): Payer: Self-pay | Admitting: *Deleted

## 2013-12-24 ENCOUNTER — Encounter (INDEPENDENT_AMBULATORY_CARE_PROVIDER_SITE_OTHER): Payer: Self-pay | Admitting: Internal Medicine

## 2013-12-24 ENCOUNTER — Ambulatory Visit (INDEPENDENT_AMBULATORY_CARE_PROVIDER_SITE_OTHER): Payer: Medicare HMO | Admitting: Internal Medicine

## 2013-12-24 VITALS — BP 140/92 | HR 60 | Temp 97.6°F | Ht 64.0 in | Wt 151.8 lb

## 2013-12-24 DIAGNOSIS — K219 Gastro-esophageal reflux disease without esophagitis: Secondary | ICD-10-CM

## 2013-12-24 DIAGNOSIS — I10 Essential (primary) hypertension: Secondary | ICD-10-CM | POA: Insufficient documentation

## 2013-12-24 MED ORDER — PANTOPRAZOLE SODIUM 40 MG PO TBEC: 40.0000 mg | DELAYED_RELEASE_TABLET | Freq: Two times a day (BID) | ORAL | Status: DC

## 2013-12-24 NOTE — Patient Instructions (Signed)
EGD. The risks and benefits such as perforation, bleeding, and infection were reviewed with the patient and is agreeable. 

## 2013-12-24 NOTE — Progress Notes (Signed)
Subjective:    Patient ID: Lori David, female    DOB: 1934-07-24, 79 y.o.   MRN: [id number]  HPI Referred to our office by Dr. Woody Seller for GERD. She tells me it feels like in her abdomen. She has burning in her esophagus. She has a lot of indigestion. Symptoms x 6 months and getting worse.   She says her symptoms are worse at night. She has a lot of pressure bloating and gas. She has frequent constipation and takes stool softener x 2 daily. Her last meal of the day is 5-6pm. She says sometimes she will get up at night and eat a cracker. She will drink a coke so she can burp to get some relief. The Nexium has helped some.  Saw Dr. Britta Mccreedy 2 yrs ago and underwent a colonoscopy.  She did not want to go back to him.      Review of Systems Past Medical History  Diagnosis Date  . Hypertension   . GERD (gastroesophageal reflux disease)   . Seasonal allergies   . Heart valve disease     leaking heart valve    Past Surgical History  Procedure Laterality Date  . Cholecystectomy  2000?    [facility]  . Tonsillectomy  1943    [facility]-no longer present  . Cataract extraction w/phaco Right 06/30/2013    Procedure: CATARACT EXTRACTION PHACO AND INTRAOCULAR LENS PLACEMENT (IOC);  Surgeon: Elta Guadeloupe T. Gershon Crane, MD;  Location: [facility];  Service: Ophthalmology;  Laterality: Right;  CDE:7.99  . Cataract extraction w/phaco Left 07/14/2013    Procedure: CATARACT EXTRACTION PHACO AND INTRAOCULAR LENS PLACEMENT (IOC);  Surgeon: Elta Guadeloupe T. Gershon Crane, MD;  Location: [facility];  Service: Ophthalmology;  Laterality: Left;  CDE:  8.25    Allergies  Allergen Reactions  . Chocolate     Numb feeling around mouth   . Peanuts [Peanut Oil] Itching  . Penicillins     Unknown     Current Outpatient Prescriptions on File Prior to Visit  Medication Sig Dispense Refill  . calcium carbonate (TUMS - DOSED IN MG ELEMENTAL CALCIUM) 500 MG chewable tablet Chew 1 tablet by mouth daily as needed for indigestion or  heartburn.    . cyanocobalamin (,VITAMIN B-12,) 1000 MCG/ML injection Inject 1,000 mcg into the muscle every 14 (fourteen) days.    . fexofenadine (ALLEGRA) 180 MG tablet Take 180 mg by mouth daily as needed for allergies or rhinitis.    . metoprolol succinate (TOPROL-XL) 25 MG 24 hr tablet Take 25 mg by mouth every evening.    . valsartan (DIOVAN) 160 MG tablet Take 160 mg by mouth 2 (two) times daily.    . Vitamin D, Ergocalciferol, (DRISDOL) 50000 UNITS CAPS capsule Take 50,000 Units by mouth every 7 (seven) days.     No current facility-administered medications on file prior to visit.        Objective:   Physical Exam  Filed Vitals:   12/24/13 1521  Height: 5\' 4"  (1.626 m)  Weight: 151 lb 12.8 oz (68.856 kg)  Alert and oriented. Skin warm and dry. Oral mucosa is moist.   . Sclera anicteric, conjunctivae is pink. Thyroid not enlarged. No cervical lymphadenopathy. Lungs clear. Heart regular rate and rhythm.  Abdomen is soft. Bowel sounds are positive. No hepatomegaly. No abdominal masses felt. No tenderness.  No edema to lower extremities.           Assessment & Plan:  GERD not controlled at this time.   (  Patient is requesting an EGD).  Protonix 40mg  BID. D/C the Nexium. Needs to raise HOB. EGD.The risks and benefits such as perforation, bleeding, and infection were reviewed with the patient and is agreeable.

## 2013-12-29 ENCOUNTER — Encounter (INDEPENDENT_AMBULATORY_CARE_PROVIDER_SITE_OTHER): Payer: Self-pay | Admitting: *Deleted

## 2013-12-29 ENCOUNTER — Other Ambulatory Visit (INDEPENDENT_AMBULATORY_CARE_PROVIDER_SITE_OTHER): Payer: Self-pay | Admitting: *Deleted

## 2013-12-29 DIAGNOSIS — K219 Gastro-esophageal reflux disease without esophagitis: Secondary | ICD-10-CM

## 2014-01-07 ENCOUNTER — Encounter (INDEPENDENT_AMBULATORY_CARE_PROVIDER_SITE_OTHER): Payer: Self-pay | Admitting: *Deleted

## 2014-01-20 ENCOUNTER — Encounter (HOSPITAL_COMMUNITY): Payer: Self-pay | Admitting: *Deleted

## 2014-01-20 ENCOUNTER — Ambulatory Visit (HOSPITAL_COMMUNITY)
Admission: RE | Admit: 2014-01-20 | Discharge: 2014-01-20 | Disposition: A | Discharge status: Home Health | Payer: Medicare HMO | Source: Ambulatory Visit | Attending: Internal Medicine | Admitting: Internal Medicine

## 2014-01-20 ENCOUNTER — Encounter (HOSPITAL_COMMUNITY)
Admission: RE | Disposition: A | Discharge status: Home Health | Payer: Self-pay | Source: Ambulatory Visit | Attending: Internal Medicine

## 2014-01-20 DIAGNOSIS — Z91010 Allergy to peanuts: Secondary | ICD-10-CM | POA: Diagnosis not present

## 2014-01-20 DIAGNOSIS — K317 Polyp of stomach and duodenum: Secondary | ICD-10-CM | POA: Insufficient documentation

## 2014-01-20 DIAGNOSIS — K219 Gastro-esophageal reflux disease without esophagitis: Secondary | ICD-10-CM | POA: Diagnosis present

## 2014-01-20 DIAGNOSIS — K228 Other specified diseases of esophagus: Secondary | ICD-10-CM | POA: Insufficient documentation

## 2014-01-20 DIAGNOSIS — Z880 Allergy status to penicillin: Secondary | ICD-10-CM | POA: Diagnosis not present

## 2014-01-20 DIAGNOSIS — I10 Essential (primary) hypertension: Secondary | ICD-10-CM | POA: Diagnosis not present

## 2014-01-20 DIAGNOSIS — K2960 Other gastritis without bleeding: Secondary | ICD-10-CM | POA: Diagnosis not present

## 2014-01-20 DIAGNOSIS — K3189 Other diseases of stomach and duodenum: Secondary | ICD-10-CM

## 2014-01-20 HISTORY — PX: ESOPHAGOGASTRODUODENOSCOPY: SHX5428

## 2014-01-20 SURGERY — EGD (ESOPHAGOGASTRODUODENOSCOPY)
Anesthesia: Moderate Sedation

## 2014-01-20 MED ORDER — MEPERIDINE HCL 50 MG/ML IJ SOLN
INTRAMUSCULAR | Status: DC | PRN
  Administered 2014-01-20 (×2): 25 mg via INTRAVENOUS

## 2014-01-20 MED ORDER — SODIUM CHLORIDE 0.9 % IV SOLN
INTRAVENOUS | Status: DC
  Administered 2014-01-20: 12:00:00 via INTRAVENOUS

## 2014-01-20 MED ORDER — BUTAMBEN-TETRACAINE-BENZOCAINE 2-2-14 % EX AERO
INHALATION_SPRAY | CUTANEOUS | Status: DC | PRN
  Administered 2014-01-20: 2 via TOPICAL

## 2014-01-20 MED ORDER — MEPERIDINE HCL 50 MG/ML IJ SOLN
INTRAMUSCULAR | Status: AC
  Filled 2014-01-20: qty 1

## 2014-01-20 MED ORDER — STERILE WATER FOR IRRIGATION IR SOLN
Status: DC | PRN
  Administered 2014-01-20: 13:00:00

## 2014-01-20 MED ORDER — SUCRALFATE 1 GM/10ML PO SUSP: 1.0000 g | Freq: Three times a day (TID) | ORAL | Status: DC

## 2014-01-20 MED ORDER — MIDAZOLAM HCL 5 MG/5ML IJ SOLN
INTRAMUSCULAR | Status: DC | PRN
  Administered 2014-01-20 (×2): 2 mg via INTRAVENOUS
  Administered 2014-01-20: 1 mg via INTRAVENOUS

## 2014-01-20 MED ORDER — MIDAZOLAM HCL 5 MG/5ML IJ SOLN
INTRAMUSCULAR | Status: AC
  Filled 2014-01-20: qty 10

## 2014-01-20 NOTE — H&P (Signed)
Lori David is an 79 y.o. female.   Chief Complaint: Patient is here for EGD. HPI: Patient is 79 year old Caucasian female who has history of heartburn and she is not responding to therapy anymore. She also has intermittent. Umbilical burning pain. She seemed to do better during the daytime but she has heartburn on most evenings. She has been on Nexium until office visit 4 weeks ago when she was begun on pantoprazole twice daily. She is still taking Nexium at lunchtime. She cannot tell a big difference. She denies nausea vomiting melena or rectal bleeding. She has lost about 20 pounds in the last 8 months because she has cut back on account for symptoms. She does not take NSAIDs.  Past Medical History  Diagnosis Date  . Hypertension   . GERD (gastroesophageal reflux disease)   . Seasonal allergies   . Heart valve disease     leaking heart valve    Past Surgical History  Procedure Laterality Date  . Cholecystectomy  2000?    [facility]  . Tonsillectomy  1943    [facility]-no longer present  . Cataract extraction w/phaco Right 06/30/2013    Procedure: CATARACT EXTRACTION PHACO AND INTRAOCULAR LENS PLACEMENT (IOC);  Surgeon: Lori Guadeloupe T. Gershon Crane, MD;  Location: [facility];  Service: Ophthalmology;  Laterality: Right;  CDE:7.99  . Cataract extraction w/phaco Left 07/14/2013    Procedure: CATARACT EXTRACTION PHACO AND INTRAOCULAR LENS PLACEMENT (IOC);  Surgeon: Lori Guadeloupe T. Gershon Crane, MD;  Location: [facility];  Service: Ophthalmology;  Laterality: Left;  CDE:  8.25    History reviewed. No pertinent family history. Social History:  reports that she has quit smoking. Her smoking use included Cigarettes. She has a 10 pack-year smoking history. She does not have any smokeless tobacco history on file. She reports that she does not drink alcohol or use illicit drugs.  Allergies:  Allergies  Allergen Reactions  . Chocolate     Numb feeling around mouth   . Peanuts [Peanut Oil] Itching  . Penicillins    Unknown     Medications Prior to Admission  Medication Sig Dispense Refill  . acetaminophen (TYLENOL) 650 MG CR tablet Take 650 mg by mouth every 8 (eight) hours as needed for pain.    [name] alum & mag hydroxide-simeth (MAALOX/MYLANTA) 200-200-20 MG/5ML suspension Take by mouth every 6 (six) hours as needed for indigestion or heartburn.    [name] aspirin 81 MG tablet Take 81 mg by mouth daily.    . bisacodyl (DULCOLAX) 5 MG EC tablet Take 5 mg by mouth daily as needed for moderate constipation.    . calcium carbonate (TUMS - DOSED IN MG ELEMENTAL CALCIUM) 500 MG chewable tablet Chew 1 tablet by mouth daily as needed for indigestion or heartburn.    . docusate sodium (COLACE) 100 MG capsule Take 100 mg by mouth 2 (two) times daily.    [name] esomeprazole (NEXIUM) 40 MG capsule Take 40 mg by mouth daily at 12 noon.    . fexofenadine (ALLEGRA) 180 MG tablet Take 180 mg by mouth daily as needed for allergies or rhinitis.    . metoprolol succinate (TOPROL-XL) 25 MG 24 hr tablet Take 25 mg by mouth every evening.    . pantoprazole (PROTONIX) 40 MG tablet Take 1 tablet (40 mg total) by mouth 2 (two) times daily before a meal. 60 tablet 3  . valsartan (DIOVAN) 160 MG tablet Take 160 mg by mouth 2 (two) times daily.    . Vitamin D, Ergocalciferol, (DRISDOL)  50000 UNITS CAPS capsule Take 50,000 Units by mouth every 7 (seven) days.    . cyanocobalamin (,VITAMIN B-12,) 1000 MCG/ML injection Inject 1,000 mcg into the muscle every 14 (fourteen) days.      No results found for this or any previous visit (from the past 48 hour(s)). No results found.  ROS  Blood pressure 180/95, pulse 57, temperature 97.8 F (36.6 C), temperature source Oral, resp. rate 13, height 5\' 4"  (1.626 m), weight 151 lb (68.493 kg), SpO2 100 %. Physical Exam  Constitutional: She appears well-developed and well-nourished.  HENT:  Mouth/Throat: Oropharynx is clear and moist.  Eyes: Conjunctivae are normal. No scleral icterus.  Neck: No  thyromegaly present.  Cardiovascular: Normal rate, regular rhythm and normal heart sounds.   No murmur heard. Respiratory: Effort normal and breath sounds normal.  GI: Soft. She exhibits no distension and no mass. There is no tenderness.  Musculoskeletal: She exhibits no edema.  Lymphadenopathy:    She has no cervical adenopathy.  Neurological: She is alert.  Skin: Skin is warm and dry.     Assessment/Plan GERD refractory to therapy. Periumbilical abdominal pain.  Diagnostic EGD.   Lori David 01/20/2014, 12:45 PM

## 2014-01-20 NOTE — Discharge Instructions (Signed)
Resume usual medications but discontinue Nexium. Resume usual diet. Sucralfate 1 g by mouth 30-60 minutes before each meal and at bedtime. No driving for 24 hours. Physician will call with biopsy results. Gastrointestinal Endoscopy, Care After Refer to this sheet in the next few weeks. These instructions provide you with information on caring for yourself after your procedure. Your caregiver may also give you more specific instructions. Your treatment has been planned according to current medical practices, but problems sometimes occur. Call your caregiver if you have any problems or questions after your procedure. HOME CARE INSTRUCTIONS  If you were given medicine to help you relax (sedative), do not drive, operate machinery, or sign important documents for 24 hours.  Avoid alcohol and hot or warm beverages for the first 24 hours after the procedure.  Only take over-the-counter or prescription medicines for pain, discomfort, or fever as directed by your caregiver. You may resume taking your normal medicines unless your caregiver tells you otherwise. Ask your caregiver when you may resume taking medicines that may cause bleeding, such as aspirin, clopidogrel, or warfarin.  You may return to your normal diet and activities on the day after your procedure, or as directed by your caregiver. Walking may help to reduce any bloated feeling in your abdomen.  Drink enough fluids to keep your urine clear or pale yellow.  You may gargle with salt water if you have a sore throat. SEEK IMMEDIATE MEDICAL CARE IF:  You have severe nausea or vomiting.  You have severe abdominal pain, abdominal cramps that last longer than 6 hours, or abdominal swelling (distention).  You have severe shoulder or back pain.  You have trouble swallowing.  You have shortness of breath, your breathing is shallow, or you are breathing faster than normal.  You have a fever or a rapid heartbeat.  You vomit blood or  material that looks like coffee grounds.  You have bloody, black, or tarry stools. MAKE SURE YOU:  Understand these instructions.  Will watch your condition.  Will get help right away if you are not doing well or get worse.

## 2014-01-20 NOTE — Op Note (Signed)
EGD PROCEDURE REPORT  PATIENT:  Lori David  MR#:  [id number] Birthdate:  1934/05/29, 79 y.o., female Endoscopist:  Dr. Rogene Houston, MD Referred By:  Dr. Glenda Chroman, MD Procedure Date: 01/20/2014  Procedure:   EGD  Indications:  Patient is 79 year old Caucasian female with few year history of GERD who is not responding to treatment anymore. She was switched from Nexium to pantoprazole 4 weeks ago and she is not any better. She is still taking Nexium in addition to pantoprazole. She also has burning perimbilical pain.            Informed Consent:  The risks, benefits, alternatives & imponderables which include, but are not limited to, bleeding, infection, perforation, drug reaction and potential missed lesion have been reviewed.  The potential for biopsy, lesion removal, esophageal dilation, etc. have also been discussed.  Questions have been answered.  All parties agreeable.  Please see history & physical in medical record for more information.  Medications:  Demerol 50 mg IV Versed 5 mg IV Cetacaine spray topically for oropharyngeal anesthesia  Description of procedure:  The endoscope was introduced through the mouth and advanced to the second portion of the duodenum without difficulty or limitations. The mucosal surfaces were surveyed very carefully during advancement of the scope and upon withdrawal.  Findings:  Esophagus:  Esophageal body was somewhat dilated. Fine granularity noted to the Cozaar at esophageal body. No erosions or ulcers noted. GE junction unremarkable. GEJ:  40 cm Stomach:  There was moderate amount of bile in the stomach no food debris was present. Stomach distended very well with insufflation. Folds in the proximal stomach were normal. Examination of mucosa revealed 5 mm polyp at gastric body along the posterior wall. There was erythema and edema to antral mucosa. Pyloric channel was patent. Angularis fundus and cardia with examined by retroflexion of scope and  were unremarkable with exception of gastric polyp below the cardia as above. Duodenum:  Normal bulbar and post bulbar mucosa.  Therapeutic/Diagnostic Maneuvers Performed:   Gastric polyp was ablated via cold biopsy. Antral biopsy was taken from routine histology. Biopsy was also taken from abnormal appearing esophageal mucosa.  Complications:  None  Impression: Somewhat dilated esophageal body with abnormal appearance to esophageal mucosa. Changes are not typical of eosinophilic esophagitis. Small polyp at proximal gastric body ablated via cold biopsy. Duedenogastric bile reflux. Nonerosive antral gastritis.  Recommendations:  Discontinue Nexium but stay on pantoprazole at twice a day schedule. Sucralfate 1 g by mouth with before meals and daily at bedtime. I will be contacting patient with biopsy results and further recommendations.  REHMAN,NAJEEB U  01/20/2014  1:16 PM  CC: Dr. Glenda Chroman., MD & Dr. Rayne Du ref. provider found

## 2014-01-21 ENCOUNTER — Encounter (HOSPITAL_COMMUNITY): Payer: Self-pay | Admitting: Internal Medicine

## 2014-01-22 ENCOUNTER — Other Ambulatory Visit (INDEPENDENT_AMBULATORY_CARE_PROVIDER_SITE_OTHER): Payer: Self-pay | Admitting: Internal Medicine

## 2014-02-08 ENCOUNTER — Other Ambulatory Visit (INDEPENDENT_AMBULATORY_CARE_PROVIDER_SITE_OTHER): Payer: Self-pay | Admitting: Internal Medicine

## 2014-02-08 DIAGNOSIS — K219 Gastro-esophageal reflux disease without esophagitis: Secondary | ICD-10-CM

## 2014-02-08 DIAGNOSIS — R1084 Generalized abdominal pain: Secondary | ICD-10-CM

## 2014-02-08 DIAGNOSIS — K5909 Other constipation: Secondary | ICD-10-CM

## 2014-02-08 DIAGNOSIS — R634 Abnormal weight loss: Secondary | ICD-10-CM

## 2014-02-09 ENCOUNTER — Encounter (INDEPENDENT_AMBULATORY_CARE_PROVIDER_SITE_OTHER): Payer: Self-pay | Admitting: *Deleted

## 2014-02-09 ENCOUNTER — Ambulatory Visit (HOSPITAL_COMMUNITY)
Admission: RE | Admit: 2014-02-09 | Discharge: 2014-02-09 | Disposition: A | Discharge status: Home / Self Care | Payer: Medicare HMO | Source: Ambulatory Visit | Attending: Internal Medicine | Admitting: Internal Medicine

## 2014-02-09 DIAGNOSIS — I700 Atherosclerosis of aorta: Secondary | ICD-10-CM | POA: Insufficient documentation

## 2014-02-09 DIAGNOSIS — K769 Liver disease, unspecified: Secondary | ICD-10-CM | POA: Insufficient documentation

## 2014-02-09 DIAGNOSIS — K5909 Other constipation: Secondary | ICD-10-CM

## 2014-02-09 DIAGNOSIS — R1084 Generalized abdominal pain: Secondary | ICD-10-CM

## 2014-02-09 DIAGNOSIS — I517 Cardiomegaly: Secondary | ICD-10-CM | POA: Insufficient documentation

## 2014-02-09 DIAGNOSIS — R634 Abnormal weight loss: Secondary | ICD-10-CM | POA: Insufficient documentation

## 2014-02-09 DIAGNOSIS — K219 Gastro-esophageal reflux disease without esophagitis: Secondary | ICD-10-CM | POA: Insufficient documentation

## 2014-02-09 DIAGNOSIS — D3502 Benign neoplasm of left adrenal gland: Secondary | ICD-10-CM | POA: Insufficient documentation

## 2014-02-09 DIAGNOSIS — J9811 Atelectasis: Secondary | ICD-10-CM | POA: Insufficient documentation

## 2014-02-09 DIAGNOSIS — D3501 Benign neoplasm of right adrenal gland: Secondary | ICD-10-CM | POA: Diagnosis not present

## 2014-02-09 DIAGNOSIS — R109 Unspecified abdominal pain: Secondary | ICD-10-CM | POA: Diagnosis present

## 2014-02-09 DIAGNOSIS — Z9049 Acquired absence of other specified parts of digestive tract: Secondary | ICD-10-CM | POA: Insufficient documentation

## 2014-02-09 DIAGNOSIS — D259 Leiomyoma of uterus, unspecified: Secondary | ICD-10-CM | POA: Insufficient documentation

## 2014-02-09 LAB — POCT I-STAT, CHEM 8
BUN: 10 mg/dL (ref 6–23)
CALCIUM ION: 1.17 mmol/L (ref 1.13–1.30)
CHLORIDE: 103 meq/L (ref 96–112)
Creatinine, Ser: 0.8 mg/dL (ref 0.50–1.10)
GLUCOSE: 80 mg/dL (ref 70–99)
HCT: 46 % (ref 36.0–46.0)
Hemoglobin: 15.6 g/dL — ABNORMAL HIGH (ref 12.0–15.0)
POTASSIUM: 3.6 mmol/L (ref 3.5–5.1)
Sodium: 139 mmol/L (ref 135–145)
TCO2: 26 mmol/L (ref 0–100)

## 2014-02-09 MED ORDER — IOHEXOL 300 MG/ML  SOLN
100.0000 mL | Freq: Once | INTRAMUSCULAR | Status: AC | PRN
  Administered 2014-02-09: 100 mL via INTRAVENOUS

## 2014-03-18 ENCOUNTER — Ambulatory Visit (INDEPENDENT_AMBULATORY_CARE_PROVIDER_SITE_OTHER): Payer: Medicare HMO | Admitting: Internal Medicine

## 2014-03-18 ENCOUNTER — Encounter (INDEPENDENT_AMBULATORY_CARE_PROVIDER_SITE_OTHER): Payer: Self-pay | Admitting: Internal Medicine

## 2014-03-18 VITALS — BP 138/88 | HR 78 | Temp 97.1°F | Resp 18 | Ht 64.0 in | Wt 150.5 lb

## 2014-03-18 DIAGNOSIS — R630 Anorexia: Secondary | ICD-10-CM

## 2014-03-18 DIAGNOSIS — R1033 Periumbilical pain: Secondary | ICD-10-CM

## 2014-03-18 MED ORDER — HYOSCYAMINE SULFATE 0.125 MG SL SUBL: 0.1250 mg | SUBLINGUAL_TABLET | SUBLINGUAL | Status: DC | PRN

## 2014-03-18 NOTE — Patient Instructions (Signed)
Please see Dr. Woody Seller regarding shortness of breath and irregular heartbeat. Physician will call with results of blood work when completed

## 2014-03-18 NOTE — Progress Notes (Signed)
Presenting complaint;  Follow-up for abdominal pain and anorexia.  Database;  She was initially seen on 12/24/2013 for worsening midabdominal pain of several months duration. She also has history of heartburn. She underwent esophagogastroduodenoscopy on 01/20/2014 revealing somewhat dilated body of the esophagus without stricture abnormal appearance of esophageal mucosa. She has small polyp at gastric body which was removed and she had nonerosive antral gastritis. She was begun on sucralfate. Esophageal biopsy reveals no abnormality. Gastric polyp was fundic type polyps and gastric biopsy was negative for H. pylori stains and also did not show changes of eosinophilic gastritis. She did not feel better with sucralfate. She underwent abdominopelvic CT on 02/09/2014 reviewed below. She now returns for scheduled visit.  Subjective;  Patient states she does not feel any better. She states the abdominal pain started over a year ago but symptoms have been worse over the last few months. She has intermittent perimbilical burning as well as bloating. In addition to this burning pain she has days when she has intense sharp pain in mid abdomen which may last for several minutes. She describes this pain as if something is rubbing against all area resulting in sudden movement. This sharp pain generally occurs at night and she also has woken up from her sleep cause of this pain. She remains with poor appetite. Her weight is down only by 1-1/2 ounces at last visit. She has had problems with chronic constipation but lately her bowels moving better. She denies melena or rectal bleeding. Patient states she develop allergic rash possibly related to detergent. She was treated with steroid injection followed by a short course of prednisone. She did not experience abdominal pain while on this therapy. She denies fever or chills or night sweats. She reports intermittent dyspnea usually with exertion but denies chest  pain. She denies dysuria or hematuria. She states she has been on Diovan for 2-3 years.   Current Medications: Outpatient Encounter Prescriptions as of 03/18/2014  Medication Sig  . acetaminophen (TYLENOL) 650 MG CR tablet Take 650 mg by mouth every 8 (eight) hours as needed for pain.  [name] alum & mag hydroxide-simeth (MAALOX/MYLANTA) 200-200-20 MG/5ML suspension Take by mouth every 6 (six) hours as needed for indigestion or heartburn.  [name] aspirin 325 MG tablet Take 325 mg by mouth daily.  . bisacodyl (DULCOLAX) 5 MG EC tablet Take 5 mg by mouth daily as needed for moderate constipation.  . calcium carbonate (TUMS - DOSED IN MG ELEMENTAL CALCIUM) 500 MG chewable tablet Chew 1 tablet by mouth daily as needed for indigestion or heartburn.  . cyanocobalamin (,VITAMIN B-12,) 1000 MCG/ML injection Inject 1,000 mcg into the muscle every 14 (fourteen) days.  [name] docusate sodium (COLACE) 100 MG capsule Take 100 mg by mouth 2 (two) times daily.  . fexofenadine (ALLEGRA) 180 MG tablet Take 180 mg by mouth daily.   . metoprolol succinate (TOPROL-XL) 25 MG 24 hr tablet Take 25 mg by mouth every evening.  . pantoprazole (PROTONIX) 40 MG tablet Take 1 tablet (40 mg total) by mouth 2 (two) times daily before a meal.  . sucralfate (CARAFATE) 1 G tablet Take 1 g by mouth 4 (four) times daily.   . valsartan (DIOVAN) 160 MG tablet Take 160 mg by mouth 2 (two) times daily.  . Vitamin D, Ergocalciferol, (DRISDOL) 50000 UNITS CAPS capsule Take 50,000 Units by mouth every 7 (seven) days.  . [DISCONTINUED] aspirin 81 MG tablet Take 81 mg by mouth daily.  . [DISCONTINUED] sucralfate (CARAFATE) 1 GM/10ML  suspension Take 10 mLs (1 g total) by mouth 4 (four) times daily -  with meals and at bedtime. (Patient not taking: Reported on 03/18/2014)     Objective: Blood pressure 138/88, pulse 78, temperature 97.1 F (36.2 C), temperature source Oral, resp. rate 18, height 5\' 4"  (1.626 m), weight 150 lb 8 oz (68.266 kg). Patient  is alert and in no acute distress. Conjunctiva is pink. Sclera is nonicteric Oropharyngeal mucosa is normal. No neck masses or thyromegaly noted. Cardiac exam with irregular rhythm normal S1 and S2. Grade 2 over systolic ejection murmur noted at left upper sternal border. Lungs are clear to auscultation. Abdomen is symmetrical. Bowel sounds are normal. No bruit noted. On palpation abdomen is soft without tenderness organomegaly or masses. No LE edema or clubbing noted.  Labs/studies Results: Abdominopelvic CT with contrast performed on 02/09/2014. No abnormality noted to small or large bowel.  No evidence of adenopathy or pancreatic lesion. Scattered calcification to abdominal aorta with both celiac trunk and SMA are patent.  Bilateral adrenal adenomas. Heterogeneous appearance to uterus with calcification consistent with uterine fibroids.  Assessment:  #1. Recurrent midabdominal pain associated with anorexia and bloating. No etiology discovered on EGD and abdominopelvic CT. Gastric biopsy was negative for H. pylori infection or eosinophilic gastritis. It is interesting to note that she got relief of her abdominal pain while she was on prednisone for allergic reaction. This scenario suggests several possibilities including eosinophilic enteritis or inflammatory bowel disease. However she does not have any evidence of small or large bowel wall thickening on CT. If she has biochemical evidence of inflammation, will consider small bowel given capsule study. Similarly her symptoms are not typical of abdominal angina. #2. GERD. Heartburn well controlled with double dose PPI except she had recent breakthrough symptoms. #3. Intermittent shortness of breath. She has history of valvular heart disease. Rhythm is irregular but she is not tachycardic. She needs to be further evaluated by Dr.Vyas.   Plan:  Discontinue sucralfate. Hyoscyamine sublingual 1 tablet 4 times a day when necessary. CBC with  differential, comprehensive chemistry panel, sedimentation rate, CRP and ANA. Patient advised to see Dr. Woody Seller in a.m. regarding shortness of breath and irregular heartbeat. Should she develop chest pain or postural symptoms she should report to emergency room. Office visit in 2 months.

## 2014-03-22 ENCOUNTER — Encounter (HOSPITAL_COMMUNITY): Payer: Self-pay | Admitting: Physician Assistant

## 2014-03-22 ENCOUNTER — Inpatient Hospital Stay (HOSPITAL_COMMUNITY)
Admission: AD | Admit: 2014-03-22 | Discharge: 2014-03-24 | DRG: 310 | Disposition: A | Discharge status: Home / Self Care | Payer: Medicare HMO | Source: Other Acute Inpatient Hospital | Attending: Cardiology | Admitting: Cardiology

## 2014-03-22 DIAGNOSIS — E785 Hyperlipidemia, unspecified: Secondary | ICD-10-CM | POA: Diagnosis present

## 2014-03-22 DIAGNOSIS — I10 Essential (primary) hypertension: Secondary | ICD-10-CM | POA: Diagnosis present

## 2014-03-22 DIAGNOSIS — Z880 Allergy status to penicillin: Secondary | ICD-10-CM

## 2014-03-22 DIAGNOSIS — M8580 Other specified disorders of bone density and structure, unspecified site: Secondary | ICD-10-CM | POA: Diagnosis present

## 2014-03-22 DIAGNOSIS — I4892 Unspecified atrial flutter: Secondary | ICD-10-CM | POA: Diagnosis present

## 2014-03-22 DIAGNOSIS — K219 Gastro-esophageal reflux disease without esophagitis: Secondary | ICD-10-CM | POA: Diagnosis present

## 2014-03-22 DIAGNOSIS — I2510 Atherosclerotic heart disease of native coronary artery without angina pectoris: Secondary | ICD-10-CM | POA: Diagnosis present

## 2014-03-22 DIAGNOSIS — Z9849 Cataract extraction status, unspecified eye: Secondary | ICD-10-CM

## 2014-03-22 DIAGNOSIS — E780 Pure hypercholesterolemia: Secondary | ICD-10-CM | POA: Diagnosis present

## 2014-03-22 DIAGNOSIS — R0602 Shortness of breath: Secondary | ICD-10-CM | POA: Diagnosis present

## 2014-03-22 DIAGNOSIS — I4891 Unspecified atrial fibrillation: Secondary | ICD-10-CM | POA: Diagnosis present

## 2014-03-22 DIAGNOSIS — Z8249 Family history of ischemic heart disease and other diseases of the circulatory system: Secondary | ICD-10-CM

## 2014-03-22 DIAGNOSIS — R0609 Other forms of dyspnea: Secondary | ICD-10-CM

## 2014-03-22 DIAGNOSIS — R931 Abnormal findings on diagnostic imaging of heart and coronary circulation: Secondary | ICD-10-CM | POA: Insufficient documentation

## 2014-03-22 HISTORY — DX: Unspecified osteoarthritis, unspecified site: M19.90

## 2014-03-22 HISTORY — DX: Other specified disorders of bone density and structure, unspecified site: M85.80

## 2014-03-22 HISTORY — DX: Unspecified atrial flutter: I48.92

## 2014-03-22 HISTORY — DX: Endocarditis, valve unspecified: I38

## 2014-03-22 HISTORY — DX: Pure hypercholesterolemia, unspecified: E78.00

## 2014-03-22 HISTORY — DX: Family history of other specified conditions: Z84.89

## 2014-03-22 HISTORY — DX: Other specified symptoms and signs involving the digestive system and abdomen: R19.8

## 2014-03-22 MED ORDER — SODIUM CHLORIDE 0.9 % IV SOLN: 250.0000 mL | INTRAVENOUS | Status: DC | PRN

## 2014-03-22 MED ORDER — SODIUM CHLORIDE 0.9 % IJ SOLN: 3.0000 mL | INTRAMUSCULAR | Status: DC | PRN

## 2014-03-22 MED ORDER — METOPROLOL TARTRATE 25 MG PO TABS
25.0000 mg | ORAL_TABLET | Freq: Two times a day (BID) | ORAL | Status: DC
  Administered 2014-03-22 – 2014-03-23 (×2): 25 mg via ORAL
  Filled 2014-03-22 (×3): qty 1

## 2014-03-22 MED ORDER — DOCUSATE SODIUM 100 MG PO CAPS
100.0000 mg | ORAL_CAPSULE | Freq: Two times a day (BID) | ORAL | Status: DC
  Administered 2014-03-22 – 2014-03-24 (×4): 100 mg via ORAL
  Filled 2014-03-22 (×5): qty 1

## 2014-03-22 MED ORDER — BISACODYL 5 MG PO TBEC: 5.0000 mg | DELAYED_RELEASE_TABLET | Freq: Every day | ORAL | Status: DC | PRN

## 2014-03-22 MED ORDER — SODIUM CHLORIDE 0.9 % IJ SOLN
3.0000 mL | Freq: Two times a day (BID) | INTRAMUSCULAR | Status: DC
Start: 2014-03-22 — End: 2014-03-24
  Administered 2014-03-23 – 2014-03-24 (×2): 3 mL via INTRAVENOUS

## 2014-03-22 MED ORDER — ONDANSETRON HCL 4 MG/2ML IJ SOLN: 4.0000 mg | Freq: Four times a day (QID) | INTRAMUSCULAR | Status: DC | PRN

## 2014-03-22 MED ORDER — VITAMIN D (ERGOCALCIFEROL) 1.25 MG (50000 UNIT) PO CAPS
50000.0000 [IU] | ORAL_CAPSULE | ORAL | Status: DC
  Administered 2014-03-22: 50000 [IU] via ORAL
  Filled 2014-03-22: qty 1

## 2014-03-22 MED ORDER — LORATADINE 10 MG PO TABS
10.0000 mg | ORAL_TABLET | Freq: Every day | ORAL | Status: DC | PRN
  Filled 2014-03-22: qty 1

## 2014-03-22 MED ORDER — ENSURE COMPLETE PO LIQD
237.0000 mL | Freq: Two times a day (BID) | ORAL | Status: DC
  Administered 2014-03-23 – 2014-03-24 (×3): 237 mL via ORAL

## 2014-03-22 MED ORDER — PANTOPRAZOLE SODIUM 40 MG PO TBEC
40.0000 mg | DELAYED_RELEASE_TABLET | Freq: Every day | ORAL | Status: DC
  Administered 2014-03-22 – 2014-03-24 (×3): 40 mg via ORAL
  Filled 2014-03-22 (×3): qty 1

## 2014-03-22 MED ORDER — DILTIAZEM HCL 100 MG IV SOLR
5.0000 mg/h | INTRAVENOUS | Status: DC
Start: 2014-03-22 — End: 2014-03-23
  Administered 2014-03-22: 20 mg/h via INTRAVENOUS
  Administered 2014-03-23: 5 mg/h via INTRAVENOUS
  Filled 2014-03-22 (×2): qty 100

## 2014-03-22 MED ORDER — ACETAMINOPHEN 325 MG PO TABS: 650.0000 mg | ORAL_TABLET | ORAL | Status: DC | PRN

## 2014-03-22 MED ORDER — APIXABAN 5 MG PO TABS
5.0000 mg | ORAL_TABLET | Freq: Two times a day (BID) | ORAL | Status: DC
Start: 2014-03-22 — End: 2014-03-24
  Administered 2014-03-22 – 2014-03-24 (×4): 5 mg via ORAL
  Filled 2014-03-22 (×5): qty 1

## 2014-03-22 NOTE — H&P (Signed)
History and Physical  Patient ID: Lori David MRN: [id number], DOB: 01/25/1935 Date of Encounter: 03/22/2014, 3:50 PM Primary Physician: No primary care provider on file. Primary Cardiologist: Wants to followup with [facility] office. Previously saw Dr. Jac Canavan and Dr. Hamilton Capri at [place].  Chief Complaint: SOB x 2 weeks Reason for Admission: newly recognized atrial fibrillation  HPI: Ms. Lori David is a 80 y/o F with history of HTN, HLD, GERD, recent stomach problems (uncertain dx), remote atrial arrhythmia, significant family history of CAD who was transferred to [facility] today upon transfer from [facility].   Regarding cardiac history, she reports a history of arrhythmia about a year ago when she got a pre-op EKG for cataract surgery. Her physician at [facility] listed this as a history of atrial flutter (records unavailable). Per patient report, she was sent to her PCP but was in normal rhythm by that time. She states she was referred to cardiology at [facility] and was told she had a leaky heart valve. She does not know further details. She has not followed with them further and states she wishes to establish care with our office in [place]. She also reports over the last year she's lost 15 lbs due to issues with her stomach. She describes this as a sensitive sensation in her stomach, like if you run your hand over your skin and it feels sensitive. Sometimes it makes her jump quickly. It tends to be worse at night, and better during the day if she gets up and moves around. She saw GI and says that an endoscopy and CT scan were unremarkable. About 3 weeks ago she developed a rash from her face to her toes and was treated with prednisone. Incidentally her stomach pain completely resolved while on prednisone. After prednisone was finished, her GI symptoms came back. She has noticed for the past 2 weeks she's had increased SOB and DOE with even minimal activity. She had an appt with  her gastroenterologist on 03/18/14 and mentioned this to him. Apparently he heard an irregular heartbeat and urged her to go for further evaluation/EKG. The patient elected instead to see her PCP the following day who diagnosed her with AF RVR. She was sent to [facility]. Toprol was titrated and IV diltiazem was started. At some point diltiazem was changed to oral form but she is back on IV diltiazem at present time (15mg /hr). Per MAR, Eliquis was started the afternoon of 03/20/14. She denies any recent chest pain, fever, chills, vomiting, BRBPR, melena, hematemesis, history of stroke, orthopnea, LEE. She performs all her own ADLs without difficulty and denies any recent falls. She does not exercise.  CXR nonacute. Labs pertinent for K 3.2 which was repleted, otherwise nonacute. TSH wnl. Troponin neg x 1. 2D echo was originally scheduled for today but family instead requested transfer to this facility for further management. She presently denies any SOB at rest but does note continued dyspnea if she gets up and walks around. HR upper 90s-120s on diltiazem 15mg /hr. + Multiple family members with CAD as below.  [place] MAR: Toprol XL 50mg  BID (last dose was Lopressor 25mg  this AM at 8:30am) Eliquis 5mg  BID (last dose today at 10:11am) Diltiazem 180mg  daily --> but currently on diltiazem gtt at 15mg /hr  Levsin 0.125mg  q4hr PRN SL Oxazepam 10mg  qhs PRN Zofran 4mg  Q6h PRN Ativan 0.5mg  BID PRN Mylanta PRN Vicodin PRN Tylenol PRN Received KCl repletion per notes, not currently scheduled  Past Medical History  Diagnosis Date  .  Hypertension   . Osteopenia   . GERD (gastroesophageal reflux disease)   . Hypercholesterolemia   . Atrial flutter     a. Pt reports episode 02/2013 noted pre-op cataracts but was in NSR at time of f/u. Saw cardiology and was told she had a leaky heart valve.  . Valvular heart disease   . GI problem     a. Pt reports workup for abdominal sensitivity with unremarkable  endoscopy and CT.     Most Recent Cardiac Studies: Unavailable.   Surgical History:  Past Surgical History  Procedure Laterality Date  . Cholecystectomy    . Tonsillectomy    . Cataract extraction       Home Meds: Prior to Admission medications   Medication Sig Start Date End Date Taking? Authorizing Provider  acetaminophen (TYLENOL) 650 MG CR tablet Take 650 mg by mouth every 8 (eight) hours as needed for pain.   Yes Historical Provider, MD  aspirin 325 MG tablet Take 325 mg by mouth daily.   Yes Historical Provider, MD  bisacodyl (DULCOLAX) 5 MG EC tablet Take 5 mg by mouth daily as needed for mild constipation or moderate constipation.   Yes Historical Provider, MD  calcium carbonate (TUMS - DOSED IN MG ELEMENTAL CALCIUM) 500 MG chewable tablet Chew 1 tablet by mouth daily as needed for indigestion or heartburn.   Yes Historical Provider, MD  cyanocobalamin (,VITAMIN B-12,) 1000 MCG/ML injection Inject 1,000 mcg into the muscle once. Patient dose is due today (03-22-14) per family members and patient   Yes Historical Provider, MD  docusate sodium (COLACE) 100 MG capsule Take 100 mg by mouth 2 (two) times daily.   Yes Historical Provider, MD  fexofenadine (ALLEGRA) 180 MG tablet Take 180 mg by mouth daily as needed for allergies.   Yes Historical Provider, MD  metoprolol succinate (TOPROL-XL) 25 MG 24 hr tablet Take 25 mg by mouth daily.   Yes Historical Provider, MD  pantoprazole (PROTONIX) 40 MG tablet Take 40 mg by mouth daily.   Yes Historical Provider, MD  valsartan (DIOVAN) 160 MG tablet Take 160 mg by mouth daily.   Yes Historical Provider, MD  Vitamin D, Ergocalciferol, (DRISDOL) 50000 UNITS CAPS capsule Take 50,000 Units by mouth every 7 (seven) days. Mondays   Yes Historical Provider, MD    Allergies:  Allergies  Allergen Reactions  . Chocolate   . Peanuts [Peanut Oil]     hives  . Penicillins     unknown    History   Social History  . Marital Status: Unknown     Spouse Name: N/A    Number of Children: N/A  . Years of Education: N/A   Occupational History  .      Retired from [employer].   Social History Main Topics  . Smoking status: Former Smoker -- 15 years  . Smokeless tobacco: Not on file     Comment: Smoked for 15 years, quit remotely.  . Alcohol Use: No  . Drug Use: No  . Sexual Activity: Not on file   Other Topics Concern  . Not on file   Social History Narrative  . No narrative on file     Family History  Problem Relation Age of Onset  . Diabetes    . Asthma    . CAD Father     MI age 20  . CAD Mother     MI age 84  . CAD Brother     MI  age 88  . CAD Brother     MI age 3  . Emphysema Father   . Heart disease Sister     One sister - unsure of what kind of heart issues  . Atrial fibrillation Sister     Review of Systems: General: negative for chills, fever Cardiovascular: negative for chest pain, edema, orthopnea, palpitations, paroxysmal nocturnal dyspnea, shortness of breath or dyspnea on exertion Dermatological: negative for rash Respiratory: negative for cough or wheezing Urologic: negative for hematuria Abdominal: negative for vomiting, bright red blood per rectum, melena, or hematemesis Neurologic: negative for visual changes, syncope All other systems reviewed and are otherwise negative except as noted above.  Labs: At [facility]: 03/19/14: WBC 6.3, Hgb 15.1, Hct 47, Plt 258 03/20/14: WBC 4.9, Hgb 12.9, Hct 39.4, Plt 217 03/20/14 PM: WBC 6.0, Hgb 13.0, Hct 39.6, Plt 224 03/20/14: Na 139, K 3.4, Cl 103, CO2 26, BUN 10, Cr 0.54, TSH 2.64, Troponin 0.01 03/21/14: Na 139, K 3.9, Cl 107, CO2 25, BUN 8, Cr 0.89, calcium 8.7  Radiology/Studies: CXR 03/19/14: cardiomegaly and mild basilar atx or scarring. No evidence of pulmonary edema or consolidation.   EKG:  03/19/14: Atrial fibrillation with RVR 157bpm, one PVC, nonspecific ST-T changes, nonacute 03/21/14: Atrial fibrillation 118bpm, no acute ST-T  changes, nonspecific ST-T changes, nonacute  Physical Exam: Blood pressure 128/77, pulse 98, temperature 98.4 F (36.9 C), temperature source Oral, resp. rate 20, SpO2 97 %. General: Well developed, well nourished WF, well appearing and comfortable, in no acute distress. Head: Normocephalic, atraumatic, sclera non-icteric, no xanthomas, nares are without discharge.  Neck: Negative for carotid bruits. JVD not elevated. Lungs: Clear bilaterally to auscultation without wheezes, rales, or rhonchi. Breathing is unlabored. Heart: Irregularly irregular with S1 S2, mildly elevated rate. No murmurs, rubs, or gallops appreciated. Abdomen: Soft, non-tender, non-distended with normoactive bowel sounds. No hepatomegaly. No rebound/guarding. No obvious abdominal masses. Msk:  Strength and tone appear normal for age. Extremities: No clubbing or cyanosis. No edema.  Distal pedal pulses are 2+ and equal bilaterally. Neuro: Alert and oriented X 3. No focal deficit. No facial asymmetry. Moves all extremities spontaneously. Psych:  Responds to questions appropriately with a normal affect.    ASSESSMENT AND PLAN:   1. Atrial fibrillation, newly recognized (CHADSVASC currently 2 for HTN, female but unknown LVEF) - ?possible single episode of atrial flutter in 2015, records not available, patient did not require intervention - since she got oral diltiazem this AM, will continue IV diltiazem drip this evening and anticipate changing to higher dose of oral diltiazem in AM (i.e. 240mg  daily) - per d/w Dr. Meda Coffee, will continue lopressor 25mg  BID - continue Eliquis - check echocardiogram - DOE could be explained by AF RVR but her family history of CAD/MI is impressive - will undertake nuclear stress testing in the AM and check lipid panel for further risk stratification  3. H/o "leaky heart valve" - obtain echocardiogram  4. Hypertension - BP controlled, follow with med changes  5. Recent gastrointestinal pain  of uncertain etiology - continue home PPI/Colace and observe for further symptoms - reportedly normal endoscopy and CT per patient   Signed, Melina Copa PA-C 03/22/2014, 3:50 PM    The patient was seen, examined and discussed with Melina Copa, PA-C and I agree with the above.   80 year old female with PMH of hypertension and gastric pain of uncertain etiology who was admitted to [facility] on 03/18/2014 after she was found out to be  in a-fib with RVR at the GI office.   The patient was placed on Cardizem drip, then transitioned to PO, however back in RVR so placed back on cardizem drip - currently 20 mg/hr with ventriclar rates in low 100'. She got Cardizem PO 180 mg CD this am. We will continue iv till tomorrow am, then switch to PO cardizem 240 mg CD daily and metoprolol PO 25 mg BID. We can uptitrate metoprolol as needed for further rate control. She has been started on Eliquis at [facility].   We will order an echocardiogram to evaluate for systolic, diastolic function and left atrial size and decide on rhythm vs rate control. We will also order a Lexiscan nuclear stress test for tomorrow to rule out ischemia as she has been experiencing progressively worsening SOB and has significant FH of premature CAD in multiple family members.   Dorothy Spark 03/22/2014

## 2014-03-22 NOTE — Progress Notes (Signed)
Pt arrived via stretcher by [facility] at 1415 to the room; pt A&O x4; pt IV intact and infusing with NS and Cardizem drip. Per [facility] Cardizem has been titrated x2 and now infusing at 15mg . Pt oriented to the unit and room; call light within reach; vitals taken; pain assessed; placed on telemetry and verified; no orders for pt yet; Dr. Meda Coffee paged and aware of pt arrival to the unit; awaiting on orders for pt. Pt and family informed of awaiting orders from MD and they voices understanding. Pt in bed comfortably with call light within reach and family at bedside. Will continue to monitor quietly. Delia Heady RN

## 2014-03-23 ENCOUNTER — Inpatient Hospital Stay (HOSPITAL_COMMUNITY): Payer: Medicare HMO

## 2014-03-23 ENCOUNTER — Telehealth: Payer: Self-pay | Admitting: Cardiovascular Disease

## 2014-03-23 DIAGNOSIS — R079 Chest pain, unspecified: Secondary | ICD-10-CM

## 2014-03-23 DIAGNOSIS — I4891 Unspecified atrial fibrillation: Secondary | ICD-10-CM

## 2014-03-23 LAB — CBC
HCT: 40.2 % (ref 36.0–46.0)
Hemoglobin: 13.1 g/dL (ref 12.0–15.0)
MCH: 28.9 pg (ref 26.0–34.0)
MCHC: 32.6 g/dL (ref 30.0–36.0)
MCV: 88.7 fL (ref 78.0–100.0)
Platelets: 229 10*3/uL (ref 150–400)
RBC: 4.53 MIL/uL (ref 3.87–5.11)
RDW: 13.9 % (ref 11.5–15.5)
WBC: 4.8 10*3/uL (ref 4.0–10.5)

## 2014-03-23 LAB — COMPREHENSIVE METABOLIC PANEL
ALK PHOS: 66 U/L (ref 39–117)
ALT: 22 U/L (ref 0–35)
AST: 17 U/L (ref 0–37)
Albumin: 3.3 g/dL — ABNORMAL LOW (ref 3.5–5.2)
Anion gap: 6 (ref 5–15)
BUN: 8 mg/dL (ref 6–23)
CALCIUM: 8.7 mg/dL (ref 8.4–10.5)
CHLORIDE: 110 mmol/L (ref 96–112)
CO2: 24 mmol/L (ref 19–32)
Creatinine, Ser: 0.67 mg/dL (ref 0.50–1.10)
GFR calc Af Amer: >90 mL/min (ref >90)
GFR, EST NON AFRICAN AMERICAN: 83 mL/min — AB (ref >90)
GLUCOSE: 93 mg/dL (ref 70–99)
POTASSIUM: 3.6 mmol/L (ref 3.5–5.1)
SODIUM: 140 mmol/L (ref 135–145)
Total Bilirubin: 0.5 mg/dL (ref 0.3–1.2)
Total Protein: 5.5 g/dL — ABNORMAL LOW (ref 6.0–8.3)

## 2014-03-23 LAB — LIPID PANEL
CHOLESTEROL: 138 mg/dL (ref 0–200)
HDL: 40 mg/dL (ref >39)
LDL Cholesterol: 78 mg/dL (ref 0–99)
TRIGLYCERIDES: 99 mg/dL (ref <150)
Total CHOL/HDL Ratio: 3.5 RATIO
VLDL: 20 mg/dL (ref 0–40)

## 2014-03-23 LAB — MAGNESIUM: Magnesium: 1.8 mg/dL (ref 1.5–2.5)

## 2014-03-23 MED ORDER — SODIUM CHLORIDE 0.9 % IV SOLN
Freq: Once | INTRAVENOUS | Status: AC
  Administered 2014-03-23: 01:00:00 via INTRAVENOUS

## 2014-03-23 MED ORDER — REGADENOSON 0.4 MG/5ML IV SOLN
INTRAVENOUS | Status: AC
  Administered 2014-03-23: 0.4 mg via INTRAVENOUS
  Filled 2014-03-23: qty 5

## 2014-03-23 MED ORDER — APIXABAN 5 MG PO TABS: 5.0000 mg | ORAL_TABLET | Freq: Two times a day (BID) | ORAL | Status: DC

## 2014-03-23 MED ORDER — DILTIAZEM HCL ER COATED BEADS 240 MG PO CP24: 240.0000 mg | ORAL_CAPSULE | Freq: Every day | ORAL | Status: DC

## 2014-03-23 MED ORDER — DILTIAZEM HCL ER COATED BEADS 240 MG PO CP24
240.0000 mg | ORAL_CAPSULE | Freq: Every day | ORAL | Status: DC
  Administered 2014-03-23 – 2014-03-24 (×2): 240 mg via ORAL
  Filled 2014-03-23 (×2): qty 1

## 2014-03-23 MED ORDER — REGADENOSON 0.4 MG/5ML IV SOLN
0.4000 mg | Freq: Once | INTRAVENOUS | Status: AC
  Administered 2014-03-23: 0.4 mg via INTRAVENOUS
  Filled 2014-03-23: qty 5

## 2014-03-23 MED ORDER — METOPROLOL TARTRATE 50 MG PO TABS
50.0000 mg | ORAL_TABLET | Freq: Two times a day (BID) | ORAL | Status: DC
  Administered 2014-03-23 – 2014-03-24 (×2): 50 mg via ORAL
  Filled 2014-03-23 (×3): qty 1

## 2014-03-23 MED ORDER — TECHNETIUM TC 99M SESTAMIBI GENERIC - CARDIOLITE
30.0000 | Freq: Once | INTRAVENOUS | Status: AC | PRN
  Administered 2014-03-23: 30 via INTRAVENOUS

## 2014-03-23 MED ORDER — TECHNETIUM TC 99M SESTAMIBI GENERIC - CARDIOLITE
10.0000 | Freq: Once | INTRAVENOUS | Status: AC | PRN
  Administered 2014-03-23: 10 via INTRAVENOUS

## 2014-03-23 MED FILL — Diltiazem HCl IV For Soln 100 MG: INTRAVENOUS | Qty: 100 | Status: AC

## 2014-03-23 MED FILL — Dextrose Inj 5%: INTRAVENOUS | Qty: 100 | Status: AC

## 2014-03-23 NOTE — Progress Notes (Signed)
Patient BP 72/60 when taken manually around 0030 this morning, MD notified and 500 ML NS bolus ordered and administerd with BP 86/59 when finished, Cardizem drip off at this time, will continue to monitor closely.

## 2014-03-23 NOTE — Discharge Instructions (Signed)
Information on my medicine - ELIQUIS® (apixaban) ° °This medication education was reviewed with me or my healthcare representative as part of my discharge preparation.  ° °Why was Eliquis® prescribed for you? °Eliquis® was prescribed for you to reduce the risk of a blood clot forming that can cause a stroke if you have a medical condition called atrial fibrillation (a type of irregular heartbeat). ° °What do You need to know about Eliquis® ? °Take your Eliquis® 5mg TWICE DAILY - one tablet in the morning and one tablet in the evening with or without food. If you have difficulty swallowing the tablet whole please discuss with your pharmacist how to take the medication safely. ° °Take Eliquis® exactly as prescribed by your doctor and DO NOT stop taking Eliquis® without talking to the doctor who prescribed the medication.  Stopping may increase your risk of developing a stroke.  Refill your prescription before you run out. ° °After discharge, you should have regular check-up appointments with your healthcare provider that is prescribing your Eliquis®.  In the future your dose may need to be changed if your kidney function or weight changes by a significant amount or as you get older. ° °What do you do if you miss a dose? °If you miss a dose, take it as soon as you remember on the same day and resume taking twice daily.  Do not take more than one dose of ELIQUIS at the same time to make up a missed dose. ° °Important Safety Information °A possible side effect of Eliquis® is bleeding. You should call your healthcare provider right away if you experience any of the following: °? Bleeding from an injury or your nose that does not stop. °? Unusual colored urine (red or dark brown) or unusual colored stools (red or black). °? Unusual bruising for unknown reasons. °? A serious fall or if you hit your head (even if there is no bleeding). ° °Some medicines may interact with Eliquis® and might increase your risk of bleeding or  clotting while on Eliquis®. To help avoid this, consult your healthcare provider or pharmacist prior to using any new prescription or non-prescription medications, including herbals, vitamins, non-steroidal anti-inflammatory drugs (NSAIDs) and supplements. ° °This website has more information on Eliquis® (apixaban): http://www.eliquis.com/eliquis/home ° °

## 2014-03-23 NOTE — Discharge Summary (Signed)
Discharge Summary   Patient ID: Lori David MRN: [id number], DOB/AGE: November 06, 1934 80 y.o. Admit date: 03/22/2014 D/C date:     03/24/2014  Primary Cardiologist: Dr. Bronson Ing ( New- wants to follow up in [place])  Active Problems:   Atrial fibrillation with RVR   HTN   Moderate MR   Recent gastrointestinal pain of uncertain etiology   Admission Dates: 03/22/14-03/23/14 Discharge Diagnosis: New onset atrial fibrillation w/ RVR  HPI: Lori David is a 80 y.o. female with a history of HTN, HLD, GERD, recent stomach problems (uncertain dx), remote atrial arrhythmia, significant family history of CAD who was transferred to [facility] on 03/22/14 upon transfer from [facility]. She was admitted to [facility] on 03/18/2014 after she was found out to be in a-fib with RVR at the GI office.   Regarding cardiac history, she reports a history of arrhythmia about a year ago when she got a pre-op EKG for cataract surgery. Her physician at [facility] listed this as a history of atrial flutter (records unavailable). Per patient report, she was sent to her PCP but was in normal rhythm by that time. She states she was referred to cardiology at [facility] and was told she had a leaky heart valve. She does not know further details. She has not followed with them further and states she wishes to establish care with our office in [place]. She also reports over the last year she's lost 15 lbs due to issues with her stomach. She describes this as a sensitive sensation in her stomach, like if you run your hand over your skin and it feels sensitive. Sometimes it makes her jump quickly. It tends to be worse at night, and better during the day if she gets up and moves around. She saw GI and says that an endoscopy and CT scan were unremarkable. About 3 weeks ago she developed a rash from her face to her toes and was treated with prednisone. Incidentally her stomach pain completely resolved while on prednisone.  After prednisone was finished, her GI symptoms came back. She has noticed for the past 2 weeks she's had increased SOB and DOE with even minimal activity. She had an appt with her gastroenterologist on 03/18/14 and mentioned this to him. Apparently he heard an irregular heartbeat and urged her to go for further evaluation/EKG. The patient elected instead to see her PCP the following day who diagnosed her with AF RVR. She was sent to [facility]. Toprol was titrated and IV diltiazem was started. At some point diltiazem was changed to oral form but she is back on IV diltiazem at present time (15mg /hr). Per MAR, Eliquis was started the afternoon of 03/20/14. She denies any recent chest pain, fever, chills, vomiting, BRBPR, melena, hematemesis, history of stroke, orthopnea, LEE. She performs all her own ADLs without difficulty and denies any recent falls. She does not exercise.   Hospital Course  Afib with RVR- newly recognized (CHADSVASC currently 3 for HTN, female, age) -- She was initially placed on dilt gtt. This was converted to cardizem 240 mg CD daily and metoprolol PO 50 mg po BID, with good rate control.  -- Placed on Eliquis 5mg  BID -- 2D ECHO today w/ LVEF 40-45%, moderate global hypokinesis, moderate MR, moderately dilated LA, lipomatous hypertrophy of the IAS, top normal IVC size.  -- Will discharge today on Eliquis and rate control. Will follow up in [place] [facility] in 1 week for TOC appt. DCCV after 3-4 weeks of anticoagulation  can be discussed at that time if she remains in atrial fibrillation.   SOB- Lexiscan nuclear stress test today to rule out ischemia as she has been experiencing progressively worsening SOB and has significant FH of premature CAD in multiple family members.  -- She underwent nuclear stress test which returned low risk with EF 51%.  H/o "leaky heart valve" -- 2D ECHO with mod MR.   Hypertension -- BP controlled, continue cardizem 240 mg CD daily and metoprolol PO  50 mg po BID  Recent gastrointestinal pain of uncertain etiology -- Continue home PPI/Colace and observe for further symptoms -- Reportedly normal endoscopy and CT per patient   The patient has had an uncomplicated hospital course and is recovering well. She has been seen by Dr. Irish Lack  and deemed ready for discharge home. All follow-up appointments have been scheduled. She has a transition of care appointment with Dr. Bronson Ing in 1 week. Discharge medications are listed below.   Discharge Vitals: Blood pressure 129/92, pulse 73, temperature 97.8 F (36.6 C), temperature source Oral, resp. rate 20, height 5\' 4"  (1.626 m), weight 145 lb 11.2 oz (66.089 kg), SpO2 96 %.  Labs: Lab Results  Component Value Date   WBC 4.8 03/23/2014   HGB 13.1 03/23/2014   HCT 40.2 03/23/2014   MCV 88.7 03/23/2014   PLT 229 03/23/2014     Recent Labs Lab 03/23/14 0442  NA 140  K 3.6  CL 110  CO2 24  BUN 8  CREATININE 0.67  CALCIUM 8.7  PROT 5.5*  BILITOT 0.5  ALKPHOS 66  ALT 22  AST 17  GLUCOSE 93   No results for input(s): CKTOTAL, CKMB, TROPONINI in the last 72 hours. Lab Results  Component Value Date   CHOL 138 03/23/2014   HDL 40 03/23/2014   LDLCALC 78 03/23/2014   TRIG 99 03/23/2014   No results found for: DDIMER  Diagnostic Studies/Procedures   Nm Myocar Multi W/spect W/wall Motion / Ef  03/23/2014   CLINICAL DATA:  Chest pain  EXAM: Lexiscan Myovue  TECHNIQUE: The patient received IV Lexiscan .4mg  over 15 seconds. 33.0 mCi of Technetium 33m Sestamibi injected at 30 seconds. Quantitative SPECT images were obtained in the vertical, horizontal and short axis planes after a 45 minute delay. Rest images were obtained with similar planes and delay using 10.2 mCi of Technetium 69m Sestamibi.  FINDINGS: ECG: Patient's resting electrocardiogram shows atrial fibrillation with no ST changes. The patient's resting heart rate was 99 and increased to 115. The patient's blood pressure at  rest was 114/89 and increased to 121/90. There were no ST changes.  Symptoms:  No chest pain or dyspnea  RAW Data:  Adequate image acquistion.  Quantitiative Gated SPECT EF: 51. TID - 0.93; ESV-29 ml; EDV-59 ml.  Perfusion Images: The images were reconstructed in the short axis as well as the vertical and horizontal long axis. There was no evidence of hypoperfusion in any vascular territory.  IMPRESSION: Normal stress nuclear study with no chest pain, no ST changes and no evidence of ischemia or infarction in any vascular territory. The gated ejection fraction was 51% and the wall motion was normal.  Kirk Ruths   Electronically Signed   By: Kirk Ruths   On: 03/23/2014 16:04     2D ECHO: 03/23/2014 LV EF: 40% -   45% Study Conclusions - Left ventricle: The cavity size was normal. Wall thickness was   normal. Systolic function was mildly to moderately reduced. The  estimated ejection fraction was in the range of 40% to 45%.   Global hypokinesis and incoordinate septal motion. The study is   not technically sufficient to allow evaluation of LV diastolic   function. - Mitral valve: Mildly thickened leaflets . There was moderate   regurgitation. - Left atrium: Moderately dilated at 41 ml/m2. - Atrial septum: There was increased thickness of the septum,   consistent with lipomatous hypertrophy. - Inferior vena cava: The vessel was normal in size. The   respirophasic diameter changes were in the normal range (>= 50%),   consistent with normal central venous pressure. Impressions: - LVEF 40-45%, moderate global hypokinesis, moderate MR, moderately   dilated LA, lipomatous hypertrophy of the IAS, top normal IVC   size.   Discharge Medications     Medication List    STOP taking these medications        aspirin 325 MG tablet     metoprolol succinate 25 MG 24 hr tablet  Commonly known as:  TOPROL-XL     valsartan 160 MG tablet  Commonly known as:  DIOVAN      TAKE these  medications        acetaminophen 650 MG CR tablet  Commonly known as:  TYLENOL  Take 650 mg by mouth every 8 (eight) hours as needed for pain.     apixaban 5 MG Tabs tablet  Commonly known as:  ELIQUIS  Take 1 tablet (5 mg total) by mouth 2 (two) times daily.     bisacodyl 5 MG EC tablet  Commonly known as:  DULCOLAX  Take 5 mg by mouth daily as needed for mild constipation or moderate constipation.     calcium carbonate 500 MG chewable tablet  Commonly known as:  TUMS - dosed in mg elemental calcium  Chew 1 tablet by mouth daily as needed for indigestion or heartburn.     cyanocobalamin 1000 MCG/ML injection  Commonly known as:  (VITAMIN B-12)  Inject 1,000 mcg into the muscle once. Patient dose is due today (03-22-14) per family members and patient     diltiazem 240 MG 24 hr capsule  Commonly known as:  CARDIZEM CD  Take 1 capsule (240 mg total) by mouth daily.     docusate sodium 100 MG capsule  Commonly known as:  COLACE  Take 100 mg by mouth 2 (two) times daily.     fexofenadine 180 MG tablet  Commonly known as:  ALLEGRA  Take 180 mg by mouth daily as needed for allergies.     metoprolol 50 MG tablet  Commonly known as:  LOPRESSOR  Take 1 tablet (50 mg total) by mouth 2 (two) times daily.     pantoprazole 40 MG tablet  Commonly known as:  PROTONIX  Take 40 mg by mouth daily.     Vitamin D (Ergocalciferol) 50000 UNITS Caps capsule  Commonly known as:  DRISDOL  Take 50,000 Units by mouth every 7 (seven) days. Mondays        Disposition   The patient will be discharged in stable condition to home. Discharge Instructions    Diet - low sodium heart healthy    Complete by:  As directed      Discharge instructions    Complete by:  As directed   Monitor your weight every morning.  If you gain 3 pounds in 24 hours, or 5 pounds in a week, call the office for instructions.     Increase activity slowly  Complete by:  As directed           Follow-up Information     Follow up with Herminio Commons, MD On 03/30/2014.   Specialty:  Cardiology   Why:  @ 2:40pm. Please arrive at 2:30pm   Contact information:   [address] [address] [phone]         Duration of Discharge Encounter: Greater than 30 minutes including physician and PA time.  Signed, Tarri Fuller, PA-C 03/24/2014, 12:33 PM   I have examined the patient and reviewed assessment and plan and discussed with patient.  Agree with above as stated.  Consider cardioversion after 3 weeks of anticoagulation.  Patient feels well.  Negative tress test.  Ready to go .  EF by nuclear was bettter than EF by echo. No clinical heart failure at this time.  May need repeat echo in a few months.   Linnet Bottari S.

## 2014-03-23 NOTE — Progress Notes (Signed)
Echocardiogram 2D Echocardiogram has been performed.  Lori David 03/23/2014, 1:35 PM

## 2014-03-23 NOTE — Progress Notes (Signed)
Patient HR up to 140's when OOB ambulating this morning, HR at rest 100's and up to 120's at times, patient asymptomatic, BP 121/72,  MD notified and Cardizem drip restarted at 5 mg/hr, patient resting comfortably at this time, with no c/o CP or dizziness, will continue to monitor closely.  Everardo All RN

## 2014-03-23 NOTE — Telephone Encounter (Signed)
Per [place], [place] from  [place]. Patient is a TOC. Needs to have telephone call. She has appointment with Dr. Bronson Ing on Tuesday, 03/30/14

## 2014-03-23 NOTE — Progress Notes (Signed)
Utilization review completed.  

## 2014-03-23 NOTE — Progress Notes (Signed)
Patient Name: Lori David Date of Encounter: 03/23/2014     Active Problems:   Atrial fibrillation   Atrial fibrillation with RVR    SUBJECTIVE  Less SOB today. Seen in nuclear stress lab for lexiscan myoview. She tolerated the procedure well. No CP.   CURRENT MEDS . apixaban  5 mg Oral BID  . docusate sodium  100 mg Oral BID  . feeding supplement (ENSURE COMPLETE)  237 mL Oral BID BM  . metoprolol tartrate  25 mg Oral BID  . pantoprazole  40 mg Oral Daily  . sodium chloride  3 mL Intravenous Q12H  . Vitamin D (Ergocalciferol)  50,000 Units Oral Q7 days    OBJECTIVE  Filed Vitals:   03/23/14 0150 03/23/14 0522 03/23/14 0726 03/23/14 0933  BP: 99/58 121/72 124/96 114/89  Pulse:  70 77 85  Temp:  98.2 F (36.8 C)    TempSrc:      Resp:  18    Height:      Weight:  145 lb 11.2 oz (66.089 kg)    SpO2:  95%      Intake/Output Summary (Last 24 hours) at 03/23/14 1004 Last data filed at 03/22/14 1700  Gross per 24 hour  Intake    240 ml  Output      0 ml  Net    240 ml   Filed Weights   03/22/14 1556 03/23/14 0522  Weight: 145 lb 8.1 oz (66 kg) 145 lb 11.2 oz (66.089 kg)    PHYSICAL EXAM  General: Pleasant, NAD. Neuro: Alert and oriented X 3. Moves all extremities spontaneously. Psych: Normal affect. HEENT:  Normal  Neck: Supple without bruits or JVD. Lungs:  Resp regular and unlabored, CTA. Heart: irreg irreg, tachy. no s3, s4, or murmurs. Abdomen: Soft, non-tender, non-distended, BS + x 4.  Extremities: No clubbing, cyanosis or edema. DP/PT/Radials 2+ and equal bilaterally.  Accessory Clinical Findings  CBC  Recent Labs  03/23/14 0442  WBC 4.8  HGB 13.1  HCT 40.2  MCV 88.7  PLT 037   Basic Metabolic Panel  Recent Labs  03/23/14 0442  NA 140  K 3.6  CL 110  CO2 24  GLUCOSE 93  BUN 8  CREATININE 0.67  CALCIUM 8.7  MG 1.8   Liver Function Tests  Recent Labs  03/23/14 0442  AST 17  ALT 22  ALKPHOS 66  BILITOT 0.5  PROT  5.5*  ALBUMIN 3.3*   Fasting Lipid Panel  Recent Labs  03/23/14 0442  CHOL 138  HDL 40  LDLCALC 78  TRIG 99  CHOLHDL 3.5     TELE  afib with CVR  Radiology/Studies  No results found.  ASSESSMENT AND PLAN  Lori David is a 80 y/o F with history of HTN, HLD, GERD, recent stomach problems (uncertain dx), remote atrial arrhythmia, significant family history of CAD who was transferred to [facility] on 03/22/14 upon transfer from [facility]. She was admitted to [facility] on 03/18/2014 after she was found out to be in a-fib with RVR at the GI office.   Afib with RVR- newly recognized (CHADSVASC currently 2 for HTN, female but unknown LVEF) -- Currently on dilt gtt, will convert this to PO cardizem 240 mg CD daily and metoprolol PO 25 mg as planned by Dr. Meda Coffee -- Continue Eliquis 5mg  BID -- 2D ECHO today to evaluate for systolic, diastolic function and left atrial size and decide on rhythm vs rate control.  SOB-  Lexiscan nuclear stress test today to rule out ischemia as she has been experiencing progressively worsening SOB and has significant FH of premature CAD in multiple family members.  -- Pending images.  H/o "leaky heart valve" -- 2D ECHO pending.   Hypertension -- BP controlled, follow with med changes  Recent gastrointestinal pain of uncertain etiology -- Continue home PPI/Colace and observe for further symptoms -- Reportedly normal endoscopy and CT per patient   Signed, Eileen Stanford PA-C  Pager [phone]  I have examined the patient and reviewed assessment and plan and discussed with patient.  Agree with above as stated.  HR still increased.  Increase metoprolol to 50 mg BID. Possible d/c later today if HR better, and nuclear and echo are ok.  Continue anticoagulation for 3-4 weeks and consider cardioversion at that time.   Sarath Privott S.

## 2014-03-23 NOTE — Progress Notes (Signed)
Nutrition Brief Note  Patient identified on the Malnutrition Screening Tool (MST) Report  Wt Readings from Last 15 Encounters:  03/23/14 145 lb 11.2 oz (66.089 kg)    Body mass index is 25 kg/(m^2). Patient meets criteria for Normal based on current BMI.   Patient reports having intermittent stomach pains for the past year. Certain foods and meals will bring the patient  stomach pain. She also reports having a very variable diet as well. Sometimes she will only be able to eat one bite and other times she is fine. She states she last lost 15 pounds this past year from these pains. It has been especially bad these past two weeks which is what brought her to the hospital. She is not too concerned and reports  She "likes her weight now". She was open to having a couple snacks to supplement her meals.  Current diet order is Heart Healthy, patient is consuming approximately 75% of meals at this time. Labs and medications reviewed.   If further nutrition issues arise, please consult RD.   Burtis Junes RD, LDN Nutrition [phone] 03/23/2014 2:24 PM

## 2014-03-24 DIAGNOSIS — R0609 Other forms of dyspnea: Secondary | ICD-10-CM

## 2014-03-24 DIAGNOSIS — R931 Abnormal findings on diagnostic imaging of heart and coronary circulation: Secondary | ICD-10-CM | POA: Insufficient documentation

## 2014-03-24 MED ORDER — METOPROLOL TARTRATE 50 MG PO TABS: 50.0000 mg | ORAL_TABLET | Freq: Two times a day (BID) | ORAL | Status: DC

## 2014-03-24 MED ORDER — DILTIAZEM HCL ER COATED BEADS 240 MG PO CP24: 240.0000 mg | ORAL_CAPSULE | Freq: Every day | ORAL | Status: DC

## 2014-03-24 MED ORDER — APIXABAN 5 MG PO TABS: 5.0000 mg | ORAL_TABLET | Freq: Two times a day (BID) | ORAL | Status: DC

## 2014-03-24 NOTE — Progress Notes (Signed)
Subjective: No dizziness, SOB  Objective: Vital signs in last 24 hours: Temp:  [97.6 F (36.4 C)-98.4 F (36.9 C)] 97.8 F (36.6 C) (02/03 0655) Pulse Rate:  [73-94] 73 (02/03 0655) Resp:  [18-20] 20 (02/03 0655) BP: (105-129)/(72-92) 129/92 mmHg (02/03 1039) SpO2:  [96 %-98 %] 96 % (02/03 0655) Last BM Date: 03/24/14  Intake/Output from previous day: 02/02 0701 - 02/03 0700 In: 483 [P.O.:480; I.V.:3] Out: -  Intake/Output this shift: Total I/O In: 240 [P.O.:240] Out: -   Medications Current Facility-Administered Medications  Medication Dose Route Frequency Provider Last Rate Last Dose  . 0.9 %  sodium chloride infusion  250 mL Intravenous PRN Dayna N Dunn, PA-C      . acetaminophen (TYLENOL) tablet 650 mg  650 mg Oral Q4H PRN Dayna N Dunn, PA-C      . apixaban (ELIQUIS) tablet 5 mg  5 mg Oral BID Dayna N Dunn, PA-C   5 mg at 03/24/14 1039  . bisacodyl (DULCOLAX) EC tablet 5 mg  5 mg Oral Daily PRN Dayna N Dunn, PA-C      . diltiazem (CARDIZEM CD) 24 hr capsule 240 mg  240 mg Oral Daily Eileen Stanford, PA-C   240 mg at 03/24/14 1040  . docusate sodium (COLACE) capsule 100 mg  100 mg Oral BID Dayna N Dunn, PA-C   100 mg at 03/24/14 1039  . feeding supplement (ENSURE COMPLETE) (ENSURE COMPLETE) liquid 237 mL  237 mL Oral BID BM Dorothy Spark, MD   237 mL at 03/24/14 1039  . loratadine (CLARITIN) tablet 10 mg  10 mg Oral Daily PRN Dayna N Dunn, PA-C      . metoprolol (LOPRESSOR) tablet 50 mg  50 mg Oral BID Jettie Booze, MD   50 mg at 03/24/14 1039  . ondansetron (ZOFRAN) injection 4 mg  4 mg Intravenous Q6H PRN Dayna N Dunn, PA-C      . pantoprazole (PROTONIX) EC tablet 40 mg  40 mg Oral Daily Dayna N Dunn, PA-C   40 mg at 03/24/14 1039  . sodium chloride 0.9 % injection 3 mL  3 mL Intravenous Q12H Dayna N Dunn, PA-C   3 mL at 03/24/14 1039  . sodium chloride 0.9 % injection 3 mL  3 mL Intravenous PRN Dayna N Dunn, PA-C      . Vitamin D (Ergocalciferol)  (DRISDOL) capsule 50,000 Units  50,000 Units Oral Q7 days Charlie Pitter, PA-C   50,000 Units at 03/22/14 1738    PE: General appearance: alert, cooperative and no distress Lungs: clear to auscultation bilaterally Heart: irregularly irregular rhythm Extremities: No LEE Pulses: 2+ and symmetric Skin: Warm and dry Neurologic: Grossly normal  Lab Results:   Recent Labs  03/23/14 0442  WBC 4.8  HGB 13.1  HCT 40.2  PLT 229   BMET  Recent Labs  03/23/14 0442  NA 140  K 3.6  CL 110  CO2 24  GLUCOSE 93  BUN 8  CREATININE 0.67  CALCIUM 8.7   PT/INR No results for input(s): LABPROT, INR in the last 72 hours. Cholesterol  Recent Labs  03/23/14 0442  CHOL 138      Assessment/Plan Lori David is a 80 y/o F with history of HTN, HLD, GERD, recent stomach problems (uncertain dx), remote atrial arrhythmia, significant family history of CAD who was transferred to [facility] on 03/22/14 upon transfer from [facility]. She was admitted to [facility] on 03/18/2014 after  she was found out to be in a-fib with RVR at the GI office.   Afib with RVR- newly recognized   EF 40-45% by echo this adm. Global hypokinesis, LA moderately dilated  Currently on dilt gtt, will convert this to PO cardizem 240 mg CD daily and metoprolol PO 50 mg BID  Continue Eliquis 5mg  BID, (CHADSVASC = 5)  SOB-   Lexiscan cardiolite negative for ischemia.  EF 51%  H/o "leaky heart valve"   Moderate MR.  OP monitorng  Hypertension BP controlled  Recent gastrointestinal pain of uncertain etiology Continue home PPI/Colace and observe for further symptoms Reportedly normal endoscopy and CT per patient   LOS: 2 days    Lori Desrosier PA-C 03/24/2014 12:00 PM

## 2014-03-24 NOTE — Progress Notes (Signed)
03/24/2014 1:47 PM Discharge AVS meds taken today and those due this evening reviewed.  Follow-up appointments and when to call md reviewed.  D/C IV and TELE.  Questions and concerns addressed.   D/C home per orders.

## 2014-03-25 NOTE — Telephone Encounter (Signed)
Left message to return call 

## 2014-03-26 NOTE — Telephone Encounter (Signed)
2nd attempt to contact patient - left message.

## 2014-03-30 ENCOUNTER — Encounter: Payer: Self-pay | Admitting: Cardiovascular Disease

## 2014-03-30 ENCOUNTER — Ambulatory Visit (INDEPENDENT_AMBULATORY_CARE_PROVIDER_SITE_OTHER): Payer: Medicare HMO | Admitting: Cardiovascular Disease

## 2014-03-30 VITALS — BP 122/82 | HR 110 | Ht 64.0 in | Wt 147.0 lb

## 2014-03-30 DIAGNOSIS — I340 Nonrheumatic mitral (valve) insufficiency: Secondary | ICD-10-CM

## 2014-03-30 DIAGNOSIS — I429 Cardiomyopathy, unspecified: Secondary | ICD-10-CM

## 2014-03-30 DIAGNOSIS — Z9289 Personal history of other medical treatment: Secondary | ICD-10-CM

## 2014-03-30 DIAGNOSIS — Z136 Encounter for screening for cardiovascular disorders: Secondary | ICD-10-CM

## 2014-03-30 DIAGNOSIS — I10 Essential (primary) hypertension: Secondary | ICD-10-CM

## 2014-03-30 DIAGNOSIS — Z87898 Personal history of other specified conditions: Secondary | ICD-10-CM

## 2014-03-30 DIAGNOSIS — I4891 Unspecified atrial fibrillation: Secondary | ICD-10-CM

## 2014-03-30 DIAGNOSIS — IMO0001 Reserved for inherently not codable concepts without codable children: Secondary | ICD-10-CM

## 2014-03-30 MED ORDER — DILTIAZEM HCL ER COATED BEADS 120 MG PO CP24: 120.0000 mg | ORAL_CAPSULE | Freq: Every evening | ORAL | Status: DC

## 2014-03-30 MED ORDER — DILTIAZEM HCL 120 MG PO TABS: 120.0000 mg | ORAL_TABLET | Freq: Every evening | ORAL | Status: DC

## 2014-03-30 MED ORDER — DILTIAZEM HCL ER COATED BEADS 240 MG PO CP24: 240.0000 mg | ORAL_CAPSULE | Freq: Every morning | ORAL | Status: DC

## 2014-03-30 NOTE — Telephone Encounter (Signed)
In office today to see Dr. Bronson Ing.

## 2014-03-30 NOTE — Progress Notes (Signed)
Patient ID: Lori David, female   DOB: 10/07/1934, 80 y.o.   MRN: [id number]      SUBJECTIVE: The patient presents to establish cardiology care in the [facility] office. She was recently hospitalized at [facility] for rapid atrial fibrillation. She was started on long-acting diltiazem and short acting metoprolol with good rate control. She was started on Eliquis for anticoagulation. Echocardiogram demonstrated mild to moderately reduced left ventricular systolic function, EF 78-24%, moderate global hypokinesis, moderate mitral regurgitation, moderate left atrial dilatation, lipomatous hypertrophy of the interatrial septum, and upper normal IVC size. She also underwent a low risk, normal nuclear stress test as she had been experiencing progressive exertional dyspnea and has a significant family history of premature coronary artery disease. LVEF 51%. GFR 83 ml/min on 03/23/14. She denies shortness of breath, but feels tired out with minimal exertion. HR today is in 110-120 bpm range. She denies chest pain and leg swelling. She has a long history of not sleeping well. Her GI provider (Dr. Laural Golden) plans to start her on sublingual prednisone to improve appetite as this has apparently worked in the past.  Soc: Her brother is also my patient.   Review of Systems: As per "subjective", otherwise negative.  Allergies  Allergen Reactions  . Chocolate   . Peanuts [Peanut Oil]     hives  . Penicillins     unknown    Current Outpatient Prescriptions  Medication Sig Dispense Refill  . acetaminophen (TYLENOL) 650 MG CR tablet Take 650 mg by mouth every 8 (eight) hours as needed for pain.    [name] apixaban (ELIQUIS) 5 MG TABS tablet Take 1 tablet (5 mg total) by mouth 2 (two) times daily. 60 tablet 11  . bisacodyl (DULCOLAX) 5 MG EC tablet Take 5 mg by mouth daily as needed for mild constipation or moderate constipation.    . calcium carbonate (TUMS - DOSED IN MG ELEMENTAL CALCIUM) 500 MG chewable tablet Chew 1  tablet by mouth daily as needed for indigestion or heartburn.    . cyanocobalamin (,VITAMIN B-12,) 1000 MCG/ML injection Inject 1,000 mcg into the muscle once. Patient dose is due today (03-22-14) per family members and patient    . diltiazem (CARDIZEM CD) 240 MG 24 hr capsule Take 1 capsule (240 mg total) by mouth daily. 30 capsule 11  . docusate sodium (COLACE) 100 MG capsule Take 100 mg by mouth 2 (two) times daily.    . fexofenadine (ALLEGRA) 180 MG tablet Take 180 mg by mouth daily as needed for allergies.    . metoprolol (LOPRESSOR) 50 MG tablet Take 1 tablet (50 mg total) by mouth 2 (two) times daily. 60 tablet 5  . pantoprazole (PROTONIX) 40 MG tablet Take 40 mg by mouth daily.    . Vitamin D, Ergocalciferol, (DRISDOL) 50000 UNITS CAPS capsule Take 50,000 Units by mouth every 7 (seven) days. Mondays     No current facility-administered medications for this visit.    Past Medical History  Diagnosis Date  . Hypertension   . Osteopenia   . GERD (gastroesophageal reflux disease)   . Hypercholesterolemia   . Atrial flutter     a. Pt reports episode 02/2013 noted pre-op cataracts but was in NSR at time of f/u. Saw cardiology and was told she had a leaky heart valve.  . Valvular heart disease   . GI problem     a. Pt reports workup for abdominal sensitivity with unremarkable endoscopy and CT.  [name] Family history of adverse reaction to  anesthesia     "some people in my family have; don't know what happened though" (03/22/2014)  . Arthritis     "in my back/gastro dr" (03/22/2014)    Past Surgical History  Procedure Laterality Date  . Cataract extraction w/ intraocular lens  implant, bilateral Bilateral 2015  . Tonsillectomy  ~ 1943  . Laparoscopic cholecystectomy  ~ 2000    History   Social History  . Marital Status: Single    Spouse Name: N/A    Number of Children: N/A  . Years of Education: N/A   Occupational History  .      Retired from [employer].   Social History  Main Topics  . Smoking status: Former Smoker -- 0.75 packs/day for 15 years  . Smokeless tobacco: Never Used     Comment: "quit smoking cigarettes in the 1970's"  . Alcohol Use: No  . Drug Use: No  . Sexual Activity: No   Other Topics Concern  . Not on file   Social History Narrative     Filed Vitals:   03/30/14 1433  Height: 5\' 4"  (1.626 m)  Weight: 147 lb (66.679 kg)   BP 122/82  Pulse 110   PHYSICAL EXAM General: NAD HEENT: Normal. Neck: No JVD, no thyromegaly. Lungs: Clear to auscultation bilaterally with normal respiratory effort. CV: Nondisplaced PMI.  Tachycardic, irregular rhythm, normal S1/S2, no S3, no murmur. No pretibial or periankle edema.  No carotid bruit.  Normal pedal pulses.  Abdomen: Soft, nontender, no hepatosplenomegaly, no distention.  Neurologic: Alert and oriented x 3.  Psych: Normal affect. Skin: Normal. Musculoskeletal: Normal range of motion, no gross deformities. Extremities: No clubbing or cyanosis.   ECG: Most recent ECG reviewed.      ASSESSMENT AND PLAN: 1. Atrial fibrillation: HR remains elevated. Will increase long-acting diltiazem to 240 mg q am and 120 mg q pm. Would consider initiation of digoxin given good renal function. Continue Eliquis. If HR becomes difficult to manage, would consider direct current cardioversion. 2. Cardiomyopathy (nonischemic), EF 40-45%: No signs of heart failure. Will optimize HR control to prevent decompensation. Hopefully, with good HR control, LV function will eventually normalize. 3. Moderate mitral regurgitation: Stable. Will continue to monitor clinically. 4. Essential HTN: Well controlled on current therapy. No changes.  Dispo: f/u in one week on 2/16.  Kate Sable, M.D., F.A.C.C.

## 2014-03-30 NOTE — Patient Instructions (Addendum)
   Increase Diltiazem CD to 240mg  every morning & add Diltiazem CD 120mg  every evening (new sent to pharmacy today.) Continue all other medications.   Follow up Tuesday, 04/06/2014.

## 2014-03-31 ENCOUNTER — Encounter (INDEPENDENT_AMBULATORY_CARE_PROVIDER_SITE_OTHER): Payer: Self-pay | Admitting: Internal Medicine

## 2014-04-06 ENCOUNTER — Ambulatory Visit (INDEPENDENT_AMBULATORY_CARE_PROVIDER_SITE_OTHER): Payer: Medicare HMO | Admitting: Cardiovascular Disease

## 2014-04-06 ENCOUNTER — Encounter: Payer: Self-pay | Admitting: Cardiovascular Disease

## 2014-04-06 VITALS — BP 100/62 | HR 78 | Ht 64.0 in | Wt 147.0 lb

## 2014-04-06 DIAGNOSIS — R0609 Other forms of dyspnea: Secondary | ICD-10-CM

## 2014-04-06 DIAGNOSIS — I482 Chronic atrial fibrillation, unspecified: Secondary | ICD-10-CM

## 2014-04-06 DIAGNOSIS — I10 Essential (primary) hypertension: Secondary | ICD-10-CM

## 2014-04-06 DIAGNOSIS — I340 Nonrheumatic mitral (valve) insufficiency: Secondary | ICD-10-CM

## 2014-04-06 DIAGNOSIS — I429 Cardiomyopathy, unspecified: Secondary | ICD-10-CM

## 2014-04-06 NOTE — Progress Notes (Signed)
Patient ID: Lori David, female   DOB: [birth year], 80 y.o.   MRN: [id number]      SUBJECTIVE: The patient presents for follow-up of rapid atrial fibrillation and a cardiomyopathy. She is feeling much better and says her shortness of breath has markedly improved. She denies chest pain, dizziness, leg swelling, and palpitations. She is here with her sister.   Review of Systems: As per "subjective", otherwise negative.  Allergies  Allergen Reactions  . Chocolate     Numb feeling around mouth   . Chocolate   . Peanuts [Peanut Oil] Itching  . Peanuts [Peanut Oil]     hives  . Penicillins     Unknown   . Penicillins     unknown    Current Outpatient Prescriptions  Medication Sig Dispense Refill  . acetaminophen (TYLENOL) 650 MG CR tablet Take 650 mg by mouth every 8 (eight) hours as needed for pain.    [name] acetaminophen (TYLENOL) 650 MG CR tablet Take 650 mg by mouth every 8 (eight) hours as needed for pain.    [name] alum & mag hydroxide-simeth (MAALOX/MYLANTA) 200-200-20 MG/5ML suspension Take by mouth every 6 (six) hours as needed for indigestion or heartburn.    [name] apixaban (ELIQUIS) 5 MG TABS tablet Take 1 tablet (5 mg total) by mouth 2 (two) times daily. 60 tablet 11  . aspirin 325 MG tablet Take 325 mg by mouth daily.    . bisacodyl (DULCOLAX) 5 MG EC tablet Take 5 mg by mouth daily as needed for moderate constipation.    . bisacodyl (DULCOLAX) 5 MG EC tablet Take 5 mg by mouth daily as needed for mild constipation or moderate constipation.    . calcium carbonate (TUMS - DOSED IN MG ELEMENTAL CALCIUM) 500 MG chewable tablet Chew 1 tablet by mouth daily as needed for indigestion or heartburn.    . calcium carbonate (TUMS - DOSED IN MG ELEMENTAL CALCIUM) 500 MG chewable tablet Chew 1 tablet by mouth daily as needed for indigestion or heartburn.    . cyanocobalamin (,VITAMIN B-12,) 1000 MCG/ML injection Inject 1,000 mcg into the muscle every 14 (fourteen) days.    . cyanocobalamin  (,VITAMIN B-12,) 1000 MCG/ML injection Inject 1,000 mcg into the muscle once. Patient dose is due today (03-22-14) per family members and patient    . diltiazem (CARDIZEM CD) 240 MG 24 hr capsule Take 1 capsule (240 mg total) by mouth every morning.    . diltiazem (DILTIAZEM CD) 120 MG 24 hr capsule Take 1 capsule (120 mg total) by mouth every evening. 30 capsule 6  . docusate sodium (COLACE) 100 MG capsule Take 100 mg by mouth 2 (two) times daily.    [name] docusate sodium (COLACE) 100 MG capsule Take 100 mg by mouth 2 (two) times daily.    . fexofenadine (ALLEGRA) 180 MG tablet Take 180 mg by mouth daily.     . fexofenadine (ALLEGRA) 180 MG tablet Take 180 mg by mouth daily as needed for allergies.    . hyoscyamine (LEVSIN SL) 0.125 MG SL tablet Place 1 tablet (0.125 mg total) under the tongue every 4 (four) hours as needed. 60 tablet 0  . metoprolol (LOPRESSOR) 50 MG tablet Take 1 tablet (50 mg total) by mouth 2 (two) times daily. 60 tablet 5  . metoprolol succinate (TOPROL-XL) 25 MG 24 hr tablet Take 25 mg by mouth every evening.    . pantoprazole (PROTONIX) 40 MG tablet Take 1 tablet (40 mg total) by mouth 2 (two)  times daily before a meal. 60 tablet 3  . pantoprazole (PROTONIX) 40 MG tablet Take 40 mg by mouth daily.    . valsartan (DIOVAN) 160 MG tablet Take 160 mg by mouth 2 (two) times daily.    . Vitamin D, Ergocalciferol, (DRISDOL) 50000 UNITS CAPS capsule Take 50,000 Units by mouth every 7 (seven) days.    . Vitamin D, Ergocalciferol, (DRISDOL) 50000 UNITS CAPS capsule Take 50,000 Units by mouth every 7 (seven) days. Mondays     No current facility-administered medications for this visit.    Past Medical History  Diagnosis Date  . Seasonal allergies   . Heart valve disease     leaking heart valve  . Hypertension   . Osteopenia   . GERD (gastroesophageal reflux disease)   . Hypercholesterolemia   . Atrial flutter     a. Pt reports episode 02/2013 noted pre-op cataracts but was in NSR  at time of f/u. Saw cardiology and was told she had a leaky heart valve.  . Valvular heart disease   . GI problem     a. Pt reports workup for abdominal sensitivity with unremarkable endoscopy and CT.  [name] Family history of adverse reaction to anesthesia     "some people in my family have; don't know what happened though" (03/22/2014)  . Arthritis     "in my back/gastro dr" (03/22/2014)    Past Surgical History  Procedure Laterality Date  . Cholecystectomy  2000?    [facility]  . Tonsillectomy  1943    [facility]-no longer present  . Cataract extraction w/phaco Right 06/30/2013    Procedure: CATARACT EXTRACTION PHACO AND INTRAOCULAR LENS PLACEMENT (IOC);  Surgeon: Elta Guadeloupe T. Gershon Crane, MD;  Location: [facility] ORS;  Service: Ophthalmology;  Laterality: Right;  CDE:7.99  . Cataract extraction w/phaco Left 07/14/2013    Procedure: CATARACT EXTRACTION PHACO AND INTRAOCULAR LENS PLACEMENT (IOC);  Surgeon: Elta Guadeloupe T. Gershon Crane, MD;  Location: [facility];  Service: Ophthalmology;  Laterality: Left;  CDE:  8.25  . Esophagogastroduodenoscopy N/A 01/20/2014    Procedure: ESOPHAGOGASTRODUODENOSCOPY (EGD);  Surgeon: Rogene Houston, MD;  Location: [facility] SUITE;  Service: Endoscopy;  Laterality: N/A;  120 - moved to 12:25 - [name] to notify pt  . Cataract extraction w/ intraocular lens  implant, bilateral Bilateral 2015  . Tonsillectomy  ~ 1943  . Laparoscopic cholecystectomy  ~ 2000    History   Social History  . Marital Status: Single    Spouse Name: N/A  . Number of Children: N/A  . Years of Education: N/A   Occupational History  . Not on file.   Social History Main Topics  . Smoking status: Former Smoker -- 0.75 packs/day for 15 years    Types: Cigarettes    Quit date: 03/18/1974  . Smokeless tobacco: Never Used     Comment: "quit smoking cigarettes in the 1970's"  . Alcohol Use: No  . Drug Use: No  . Sexual Activity: No   Other Topics Concern  . Not on file   Social History Narrative   ** Merged  History Encounter **         Filed Vitals:   04/06/14 1417  BP: 100/62  Pulse: 78  Height: 5\' 4"  (1.626 m)  Weight: 147 lb (66.679 kg)    PHYSICAL EXAM General: NAD HEENT: Normal. Neck: No JVD, no thyromegaly. Lungs: Clear to auscultation bilaterally with normal respiratory effort. CV: Nondisplaced PMI. Regular rate, irregular rhythm, normal S1/S2, no S3, no  murmur. No pretibial or periankle edema. No carotid bruit. Normal pedal pulses.  Abdomen: Soft, nontender, no hepatosplenomegaly, no distention.  Neurologic: Alert and oriented x 3.  Psych: Normal affect. Skin: Normal. Musculoskeletal: Normal range of motion, no gross deformities. Extremities: No clubbing or cyanosis.   ECG: Most recent ECG reviewed.      ASSESSMENT AND PLAN: 1. Atrial fibrillation: HR is now well controlled with ncrease of long-acting diltiazem to 240 mg q am and 120 mg q pm. Would consider initiation of digoxin given good renal function if tachycardic in the future. Continue Eliquis. I offered Holter monitoring to assess her average HR as her sister says the patient will not tell siblings when she is experiencing symptoms. However, the patient declines at this time. 2. Cardiomyopathy (nonischemic), EF 40-45%: No signs of heart failure. Hopefully, with good HR control, LV function will eventually normalize. 3. Moderate mitral regurgitation: Stable. Will continue to monitor clinically. 4. Essential HTN: Well controlled on current therapy. No changes.  Dispo: f/u 2 months. She will call back to clarify all of her medications.   Kate Sable, M.D., F.A.C.C.

## 2014-04-06 NOTE — Patient Instructions (Signed)
Continue all current medications. Follow up in  2 months  

## 2014-04-09 ENCOUNTER — Other Ambulatory Visit: Payer: Self-pay | Admitting: *Deleted

## 2014-04-26 ENCOUNTER — Telehealth: Payer: Self-pay | Admitting: Cardiovascular Disease

## 2014-04-26 NOTE — Telephone Encounter (Signed)
Ms. Lori David calls today stating that she has no energy. States that she just not have energy to even move around. No pain.  Please call cell phone #

## 2014-04-26 NOTE — Telephone Encounter (Signed)
Just getting over sinus infection.  Was on ABO x 5 days.  Seems like she is noticing the no energy more since recent med increase on Diltiazem.  States that she is not sure though, could all be related to recent infection.  Does have a little SOB today, but may be related to sinus infection.  Stated that a while back her Metoprolol had to be cut back due to similar symptoms.  No chest pain or dizziness.  No fever.

## 2014-04-27 NOTE — Telephone Encounter (Signed)
I would wait until infection resolves. Would check HR. She needs good HR control to improve heart function due to rapid atrial fibrillation.

## 2014-04-27 NOTE — Telephone Encounter (Signed)
Feeling maybe a little better today.  Still c/o no energy, extreme fatigue.  Does not feel that she is completely over sinus infection & does tend to wipe her out when she has them.  Recommended she see PMD for recheck tomorrow.  She will call back if not resolved with PMD visit.

## 2014-05-18 ENCOUNTER — Ambulatory Visit (INDEPENDENT_AMBULATORY_CARE_PROVIDER_SITE_OTHER): Payer: Medicare HMO | Admitting: Internal Medicine

## 2014-05-18 ENCOUNTER — Encounter (INDEPENDENT_AMBULATORY_CARE_PROVIDER_SITE_OTHER): Payer: Self-pay | Admitting: Internal Medicine

## 2014-05-18 VITALS — BP 116/74 | HR 92 | Temp 97.6°F | Resp 18 | Ht 64.0 in | Wt 148.8 lb

## 2014-05-18 DIAGNOSIS — R140 Abdominal distension (gaseous): Secondary | ICD-10-CM | POA: Diagnosis not present

## 2014-05-18 DIAGNOSIS — K219 Gastro-esophageal reflux disease without esophagitis: Secondary | ICD-10-CM | POA: Diagnosis not present

## 2014-05-18 NOTE — Progress Notes (Addendum)
Presenting complaint;  Follow-up regarding abdominal pain and anorexia and bloating.  Database;  She was initially seen on 12/24/2013 for worsening midabdominal pain of several months duration. She also has history of heartburn. She underwent esophagogastroduodenoscopy on 01/20/2014 revealing somewhat dilated body of the esophagus without stricture abnormal appearance of esophageal mucosa. She has small polyp at gastric body which was removed and she had nonerosive antral gastritis. She was begun on sucralfate. Esophageal biopsy reveals no abnormality. Gastric polyp was fundic type polyps and gastric biopsy was negative for H. pylori stains and also did not show changes of eosinophilic gastritis. She did not feel better with sucralfate. She underwent abdominopelvic CT on 02/09/2014 reviewed below. She was last seen on 03/18/2014. He was advised to discontinue sucralfate and given prescription for hyoscyamine sublingual. He was also advised to get blood work which she was not able to do until a few weeks ago. On her last visit she was also complaining of shortness of breath and was advised to follow-up with Dr. Woody Seller ASAP.   Subjective:  Patient stated she was not able to have lab studies until about 5 weeks ago. She is not aware of the results and we have not received a copy at.  Patient states she was seen by Dr. Woody Seller one day following her visit to her office and she was noted to be in atrial fibrillation. She was admitted to [facility] and subsequently transferred to [facility] Ventricular rate has been controlled with therapy and her shortness of breath is resolved. Patient states she tried hyoscyamine suddenly gone for a few days but she did not feel any better and if her stop the medication. It was not covered by her insurance and she had to pay out of pocket. She states few days after she was discharged from the hospital she developed sinus infection and was treated with Z-Pak to be followed by Cipro.  Ever since taking antibiotics she has noted almost complete resolution of her abdominal pain and bloating and she is able to eat. She has lost about 2 pounds since her last visit. She believes her weight loss has leveled off. Bowels are moving with laxative. She denies melena or rectal bleeding. Since finishing antibiotic therapy she has had very few episodes of epigastric pain or heartburn.   Current Medications: Outpatient Encounter Prescriptions as of 05/18/2014  Medication Sig  . acetaminophen (TYLENOL) 650 MG CR tablet Take 650 mg by mouth every 8 (eight) hours as needed for pain.  [name] alum & mag hydroxide-simeth (MAALOX/MYLANTA) 200-200-20 MG/5ML suspension Take by mouth every 6 (six) hours as needed for indigestion or heartburn.  [name] apixaban (ELIQUIS) 5 MG TABS tablet Take 1 tablet (5 mg total) by mouth 2 (two) times daily.  . bisacodyl (DULCOLAX) 5 MG EC tablet Take 5 mg by mouth as needed.  . calcium carbonate (TUMS - DOSED IN MG ELEMENTAL CALCIUM) 500 MG chewable tablet Chew 1 tablet by mouth daily as needed for indigestion or heartburn.  . cyanocobalamin (,VITAMIN B-12,) 1000 MCG/ML injection Inject 1,000 mcg into the muscle every 14 (fourteen) days.  [name] diltiazem (CARDIZEM CD) 240 MG 24 hr capsule Take 1 capsule (240 mg total) by mouth every morning.  . diltiazem (DILTIAZEM CD) 120 MG 24 hr capsule Take 1 capsule (120 mg total) by mouth every evening.  . docusate sodium (COLACE) 100 MG capsule Take 100 mg by mouth 2 (two) times daily.  . metoprolol (LOPRESSOR) 50 MG tablet Take 1 tablet (50 mg total) by  mouth 2 (two) times daily.  . pantoprazole (PROTONIX) 40 MG tablet Take 40 mg by mouth daily.  . Vitamin D, Ergocalciferol, (DRISDOL) 50000 UNITS CAPS capsule Take 50,000 Units by mouth every 7 (seven) days. Mondays  . [DISCONTINUED] fexofenadine (ALLEGRA) 180 MG tablet Take 180 mg by mouth as needed.  . [DISCONTINUED] hyoscyamine (LEVSIN SL) 0.125 MG SL tablet Place 1 tablet (0.125 mg  total) under the tongue every 4 (four) hours as needed. (Patient not taking: Reported on 05/18/2014)     Objective: Blood pressure 116/74, pulse 92, temperature 97.6 F (36.4 C), temperature source Oral, resp. rate 18, height 5\' 4"  (1.626 m), weight 148 lb 12.8 oz (67.495 kg). Patient is alert and in no acute distress. Conjunctiva is pink. Sclera is nonicteric Oropharyngeal mucosa is normal. No neck masses or thyromegaly noted. Cardiac exam with irregular rhythm normal S1 and S2. No murmur or gallop noted. Lungs are clear to auscultation. Abdomen is symmetrical. Bowel sounds are hyperactive. On palpation abdomen is soft and nontender without organomegaly or masses. No LE edema or clubbing noted.  Lab data from 04/09/2014 obtained from Dr. Woody Seller office today  CBC and comprehensive chemistry panel are normal  ANA negative  Sed rate 2 CRP  0.5(0-4.9)   Assessment:  #1. Abdominal pain and bloating has responded to antibiotic therapy that she took for sinusitis. This scenario suggests that she has small bowel bacterial overgrowth. If and when symptoms relapse will proceed with hydrogen breath test to confirm this diagnosis since she may have to be treated in future. #2. GERD. Heartburn is well controlled with therapy.  Plan:  Patient will come T pantoprazole at 40 mg by mouth every morning. She will call office to schedule hydrogen breath test when symptoms relapse. Office visit in 6 months.

## 2014-05-18 NOTE — Patient Instructions (Signed)
Call office when symptoms relapse in which case will proceed with breath test looking for small intestinal bacterial overgrowth

## 2014-06-01 ENCOUNTER — Encounter: Payer: Self-pay | Admitting: Cardiovascular Disease

## 2014-06-01 ENCOUNTER — Ambulatory Visit (INDEPENDENT_AMBULATORY_CARE_PROVIDER_SITE_OTHER): Payer: Medicare HMO | Admitting: Cardiovascular Disease

## 2014-06-01 VITALS — BP 100/62 | HR 93 | Ht 64.0 in | Wt 148.0 lb

## 2014-06-01 DIAGNOSIS — I10 Essential (primary) hypertension: Secondary | ICD-10-CM

## 2014-06-01 DIAGNOSIS — I482 Chronic atrial fibrillation, unspecified: Secondary | ICD-10-CM

## 2014-06-01 DIAGNOSIS — I429 Cardiomyopathy, unspecified: Secondary | ICD-10-CM

## 2014-06-01 DIAGNOSIS — R0609 Other forms of dyspnea: Secondary | ICD-10-CM

## 2014-06-01 DIAGNOSIS — I340 Nonrheumatic mitral (valve) insufficiency: Secondary | ICD-10-CM

## 2014-06-01 DIAGNOSIS — R5383 Other fatigue: Secondary | ICD-10-CM | POA: Diagnosis not present

## 2014-06-01 MED ORDER — DIGOXIN 125 MCG PO TABS: 0.1250 mg | ORAL_TABLET | Freq: Every day | ORAL | Status: DC

## 2014-06-01 NOTE — Progress Notes (Signed)
Patient ID: Raelea Gosse, female   DOB: 06-06-1934, 80 y.o.   MRN: [id number]      SUBJECTIVE: The patient presents for follow-up of rapid atrial fibrillation and cardiomyopathy. Since her last visit with me, she has had 2 sinus infections,  abdominal pain for which she saw gastroenterology, and has had diminished appetite and decreased sleep. She complains of fatigue today. She denies chest pain and palpitations. She sometimes feels short of breath with little exertion. She denies orthopnea, paroxysmal nocturnal dyspnea, and leg swelling. CBC in 03/2014 was normal and BMET was unremarkable.  She is here with her brother, Lauralei Clouse, who is also my patient.   Review of Systems: As per "subjective", otherwise negative.  Allergies  Allergen Reactions  . Chocolate     Numb feeling around mouth   . Chocolate   . Peanuts [Peanut Oil] Itching  . Peanuts [Peanut Oil]     hives  . Penicillins     Unknown   . Penicillins     unknown    Current Outpatient Prescriptions  Medication Sig Dispense Refill  . acetaminophen (TYLENOL) 650 MG CR tablet Take 650 mg by mouth every 8 (eight) hours as needed for pain.    [name] apixaban (ELIQUIS) 5 MG TABS tablet Take 1 tablet (5 mg total) by mouth 2 (two) times daily. 60 tablet 11  . bisacodyl (DULCOLAX) 5 MG EC tablet Take 5 mg by mouth as needed.    . calcium carbonate (TUMS - DOSED IN MG ELEMENTAL CALCIUM) 500 MG chewable tablet Chew 1 tablet by mouth daily as needed for indigestion or heartburn.    . cyanocobalamin (,VITAMIN B-12,) 1000 MCG/ML injection Inject 1,000 mcg into the muscle every 14 (fourteen) days.    [name] diltiazem (CARDIZEM CD) 240 MG 24 hr capsule Take 1 capsule (240 mg total) by mouth every morning.    . diltiazem (DILTIAZEM CD) 120 MG 24 hr capsule Take 1 capsule (120 mg total) by mouth every evening. 30 capsule 6  . docusate sodium (COLACE) 100 MG capsule Take 100 mg by mouth 2 (two) times daily.    . metoprolol (LOPRESSOR) 50 MG  tablet Take 1 tablet (50 mg total) by mouth 2 (two) times daily. 60 tablet 5  . pantoprazole (PROTONIX) 40 MG tablet Take 40 mg by mouth daily.    . Vitamin D, Ergocalciferol, (DRISDOL) 50000 UNITS CAPS capsule Take 50,000 Units by mouth every 7 (seven) days. Mondays     No current facility-administered medications for this visit.    Past Medical History  Diagnosis Date  . Seasonal allergies   . Heart valve disease     leaking heart valve  . Hypertension   . Osteopenia   . GERD (gastroesophageal reflux disease)   . Hypercholesterolemia   . Atrial flutter     a. Pt reports episode 02/2013 noted pre-op cataracts but was in NSR at time of f/u. Saw cardiology and was told she had a leaky heart valve.  . Valvular heart disease   . GI problem     a. Pt reports workup for abdominal sensitivity with unremarkable endoscopy and CT.  [name] Family history of adverse reaction to anesthesia     "some people in my family have; don't know what happened though" (03/22/2014)  . Arthritis     "in my back/gastro dr" (03/22/2014)    Past Surgical History  Procedure Laterality Date  . Cholecystectomy  2000?    [facility]  . Tonsillectomy  1943  [facility]-no longer present  . Cataract extraction w/phaco Right 06/30/2013    Procedure: CATARACT EXTRACTION PHACO AND INTRAOCULAR LENS PLACEMENT (IOC);  Surgeon: Elta Guadeloupe T. Gershon Crane, MD;  Location: [facility];  Service: Ophthalmology;  Laterality: Right;  CDE:7.99  . Cataract extraction w/phaco Left 07/14/2013    Procedure: CATARACT EXTRACTION PHACO AND INTRAOCULAR LENS PLACEMENT (IOC);  Surgeon: Elta Guadeloupe T. Gershon Crane, MD;  Location: [facility];  Service: Ophthalmology;  Laterality: Left;  CDE:  8.25  . Esophagogastroduodenoscopy N/A 01/20/2014    Procedure: ESOPHAGOGASTRODUODENOSCOPY (EGD);  Surgeon: Rogene Houston, MD;  Location: [facility];  Service: Endoscopy;  Laterality: N/A;  120 - moved to 12:25 - [name] to notify pt  . Cataract extraction w/ intraocular lens   implant, bilateral Bilateral 2015  . Tonsillectomy  ~ 1943  . Laparoscopic cholecystectomy  ~ 2000    History   Social History  . Marital Status: Single    Spouse Name: N/A  . Number of Children: N/A  . Years of Education: N/A   Occupational History  . Not on file.   Social History Main Topics  . Smoking status: Former Smoker -- 0.75 packs/day for 15 years    Types: Cigarettes    Quit date: 03/18/1974  . Smokeless tobacco: Never Used     Comment: "quit smoking cigarettes in the 1970's"  . Alcohol Use: No  . Drug Use: No  . Sexual Activity: No   Other Topics Concern  . Not on file   Social History Narrative   ** Merged History Encounter **         Filed Vitals:   06/01/14 1341  BP: 100/62  Pulse: 93  Height: 5\' 4"  (1.626 m)  Weight: 148 lb (67.132 kg)  SpO2: 97%    PHYSICAL EXAM General: NAD HEENT: Normal. Neck: No JVD, no thyromegaly. Lungs: Clear to auscultation bilaterally with normal respiratory effort. CV: Nondisplaced PMI. Regular rate, irregular rhythm, normal S1/S2, no S3, no murmur. No pretibial or periankle edema. No carotid bruit. Normal pedal pulses.  Abdomen: Soft, nontender, no hepatosplenomegaly, no distention.  Neurologic: Alert and oriented x 3.  Psych: Normal affect. Skin: Normal. Musculoskeletal: Normal range of motion, no gross deformities. Extremities: No clubbing or cyanosis.    ECG: Most recent ECG reviewed.      ASSESSMENT AND PLAN: 1. Atrial fibrillation: HR is reasonably controlled with increase of long-acting diltiazem to 240 mg q am and 120 mg q pm. I am uncertain if her fatigue is due to rapid atrial fibrillation. Will try adding low-dose digoxin given good renal function for more optimal HR control. I do not want to increase metoprolol as SBP remains in 100 mmHg range, which is what it was at her last visit when she was feeling well.  Continue Eliquis. I previously offered Holter monitoring to assess her average HR  as her sister had told me the patient will not tell siblings when she is experiencing symptoms, but the patient declined.  2. Cardiomyopathy (nonischemic), EF 40-45%: No signs of heart failure. Hopefully, with good HR control, LV function will eventually normalize. Adding digoxin.  3. Moderate mitral regurgitation: Stable. Will continue to monitor clinically.  4. Essential HTN: Well controlled on current therapy. No changes.  Dispo: f/u 3 months. I have asked her to call back in a month to inform me if symptoms have improved.   Kate Sable, M.D., F.A.C.C.

## 2014-06-01 NOTE — Patient Instructions (Signed)
Your physician recommends that you schedule a follow-up appointment in: [place] physician has recommended you make the following change in your medication:   START DIGOXIN 0.125 MG   CONTINUE ALL OTHER MEDICATIONS AS DIRECTED  PLEASE CALL us IN 1 MONTH WITH UPDATE ON YOUR SYMPTOMS   Thank you for choosing [facility]!!

## 2014-06-03 ENCOUNTER — Encounter (INDEPENDENT_AMBULATORY_CARE_PROVIDER_SITE_OTHER): Payer: Self-pay

## 2014-06-17 ENCOUNTER — Other Ambulatory Visit (INDEPENDENT_AMBULATORY_CARE_PROVIDER_SITE_OTHER): Payer: Self-pay | Admitting: Internal Medicine

## 2014-07-01 ENCOUNTER — Telehealth: Payer: Self-pay | Admitting: *Deleted

## 2014-07-01 NOTE — Telephone Encounter (Signed)
Call placed to patient, tolerating the Digoxin & seems to be doing better.

## 2014-07-01 NOTE — Telephone Encounter (Signed)
-----   Message from Laurine Blazer, LPN sent at 3/49/6116  3:20 PM EDT ----- Regarding: PATIENT TO CALL WITH UPDATE IN 1 MONTH  STARTED ON DIGOXIN ON 4/12, PT TO CALL ON HOW DOING WITH NEW MEDICATION.

## 2014-08-05 ENCOUNTER — Telehealth (INDEPENDENT_AMBULATORY_CARE_PROVIDER_SITE_OTHER): Payer: Self-pay | Admitting: *Deleted

## 2014-08-05 NOTE — Telephone Encounter (Signed)
Lori David said she was told by Dr. Laural Golden when her stomach was actively bothering her he would like for her to have a breath test. Her return phone number is [phone].

## 2014-08-09 NOTE — Telephone Encounter (Signed)
Patient is calling back about the message left on 08/05/14. Message was sent to [place] and she didn't get a return call.

## 2014-08-09 NOTE — Telephone Encounter (Signed)
Per patient's OV note Dr.Rehman noted the following.  Patient will come T pantoprazole at 40 mg by mouth every morning. She will call office to schedule hydrogen breath test when symptoms relapse. Office visit in 6 months.  Per Dr.Rehman ,arrange the Hydrogen breath test. Patient has not been called yet.

## 2014-08-10 ENCOUNTER — Other Ambulatory Visit (INDEPENDENT_AMBULATORY_CARE_PROVIDER_SITE_OTHER): Payer: Self-pay | Admitting: *Deleted

## 2014-08-10 DIAGNOSIS — R109 Unspecified abdominal pain: Secondary | ICD-10-CM

## 2014-08-10 DIAGNOSIS — R12 Heartburn: Secondary | ICD-10-CM

## 2014-08-10 DIAGNOSIS — R140 Abdominal distension (gaseous): Secondary | ICD-10-CM

## 2014-08-10 NOTE — Telephone Encounter (Signed)
Bacterial overgrowth test sch'd 08/13/14 @ 730 (700), patient aware

## 2014-08-13 ENCOUNTER — Encounter (HOSPITAL_COMMUNITY)
Admission: RE | Disposition: A | Discharge status: Home / Self Care | Payer: Self-pay | Source: Ambulatory Visit | Attending: Internal Medicine

## 2014-08-13 ENCOUNTER — Ambulatory Visit (HOSPITAL_COMMUNITY)
Admission: RE | Admit: 2014-08-13 | Discharge: 2014-08-13 | Disposition: A | Discharge status: Home / Self Care | Payer: Medicare HMO | Source: Ambulatory Visit | Attending: Internal Medicine | Admitting: Internal Medicine

## 2014-08-13 DIAGNOSIS — Z880 Allergy status to penicillin: Secondary | ICD-10-CM | POA: Insufficient documentation

## 2014-08-13 DIAGNOSIS — Z87891 Personal history of nicotine dependence: Secondary | ICD-10-CM | POA: Diagnosis not present

## 2014-08-13 DIAGNOSIS — I4892 Unspecified atrial flutter: Secondary | ICD-10-CM | POA: Insufficient documentation

## 2014-08-13 DIAGNOSIS — Z9102 Food additives allergy status: Secondary | ICD-10-CM | POA: Diagnosis not present

## 2014-08-13 DIAGNOSIS — I10 Essential (primary) hypertension: Secondary | ICD-10-CM | POA: Insufficient documentation

## 2014-08-13 DIAGNOSIS — Z91010 Allergy to peanuts: Secondary | ICD-10-CM | POA: Diagnosis not present

## 2014-08-13 DIAGNOSIS — R140 Abdominal distension (gaseous): Secondary | ICD-10-CM | POA: Diagnosis not present

## 2014-08-13 DIAGNOSIS — E780 Pure hypercholesterolemia: Secondary | ICD-10-CM | POA: Diagnosis not present

## 2014-08-13 DIAGNOSIS — K219 Gastro-esophageal reflux disease without esophagitis: Secondary | ICD-10-CM | POA: Insufficient documentation

## 2014-08-13 DIAGNOSIS — M4690 Unspecified inflammatory spondylopathy, site unspecified: Secondary | ICD-10-CM | POA: Insufficient documentation

## 2014-08-13 DIAGNOSIS — R109 Unspecified abdominal pain: Secondary | ICD-10-CM | POA: Diagnosis not present

## 2014-08-13 HISTORY — PX: BACTERIAL OVERGROWTH TEST: SHX5739

## 2014-08-13 SURGERY — BREATH TEST, FOR INTESTINAL BACTERIAL OVERGROWTH

## 2014-08-13 MED ORDER — LACTULOSE 10 GM/15ML PO SOLN
ORAL | Status: AC
  Filled 2014-08-13: qty 60

## 2014-08-13 MED ORDER — LACTULOSE 10 GM/15ML PO SOLN
25.0000 g | Freq: Once | ORAL | Status: AC
  Administered 2014-08-13: 25 g via ORAL
  Filled 2014-08-13: qty 45

## 2014-08-13 NOTE — Progress Notes (Signed)
Per Dr. Laural Golden- patient to complete Breathe Test in 120 minutes. Showed Dr. Laural Golden the results-he stated to complete test in 120 minutes. Patient doing well after test. Had 1 bowel movement during test. No complaints when leaving.

## 2014-08-13 NOTE — Procedures (Signed)
No beans, bran or high fiber cereal the day before the procedure? NO NPO except for water 12 hours before procedure? NO No smoking, sleeping or vigorous exercising for at least 30 before procedure?    NO Recent antibiotic use and/or diarrhea? NO   If yes, physician notified.  Time Baseline 15 mins 30 mins 45 mins 60 mins 75 mins 90 mins 105 mins 120 mins 135 mins 150 mins 165 mins 180 mins  H2-ppm 007 [phone]

## 2014-08-13 NOTE — H&P (Signed)
Lori David is an 80 y.o. female.   Chief Complaint: Patient is here for hydrogen breath test. HPI: Patient is 80 year old Caucasian female with recurrent symptoms of abdominal pain anorexia and bloating was undergone extensive evaluation. Ration called office earlier this week to let us with her symptoms of relapsed. She is here for scheduled hydrogen breath test to rule out small intestinal bacterial overgrowth.  Past Medical History  Diagnosis Date  . Seasonal allergies   . Heart valve disease     leaking heart valve  . Hypertension   . Osteopenia   . GERD (gastroesophageal reflux disease)   . Hypercholesterolemia   . Atrial flutter     a. Pt reports episode 02/2013 noted pre-op cataracts but was in NSR at time of f/u. Saw cardiology and was told she had a leaky heart valve.  . Valvular heart disease   . GI problem     a. Pt reports workup for abdominal sensitivity with unremarkable endoscopy and CT.  [name] Family history of adverse reaction to anesthesia     "some people in my family have; don't know what happened though" (03/22/2014)  . Arthritis     "in my back/gastro dr" (03/22/2014)    Past Surgical History  Procedure Laterality Date  . Cholecystectomy  2000?    [facility]  . Tonsillectomy  1943    [facility]-no longer present  . Cataract extraction w/phaco Right 06/30/2013    Procedure: CATARACT EXTRACTION PHACO AND INTRAOCULAR LENS PLACEMENT (IOC);  Surgeon: Elta Guadeloupe T. Gershon Crane, MD;  Location: [facility];  Service: Ophthalmology;  Laterality: Right;  CDE:7.99  . Cataract extraction w/phaco Left 07/14/2013    Procedure: CATARACT EXTRACTION PHACO AND INTRAOCULAR LENS PLACEMENT (IOC);  Surgeon: Elta Guadeloupe T. Gershon Crane, MD;  Location: [facility];  Service: Ophthalmology;  Laterality: Left;  CDE:  8.25  . Esophagogastroduodenoscopy N/A 01/20/2014    Procedure: ESOPHAGOGASTRODUODENOSCOPY (EGD);  Surgeon: Rogene Houston, MD;  Location: [facility];  Service: Endoscopy;  Laterality: N/A;  120 -  moved to 12:25 - [name] to notify pt  . Cataract extraction w/ intraocular lens  implant, bilateral Bilateral 2015  . Tonsillectomy  ~ 1943  . Laparoscopic cholecystectomy  ~ 2000    Family History  Problem Relation Age of Onset  . Diabetes    . Asthma    . CAD Father     MI age 18  . CAD Mother     MI age 49  . CAD Brother     MI age 75  . CAD Brother     MI age 18  . Emphysema Father   . Heart disease Sister     One sister - unsure of what kind of heart issues  . Atrial fibrillation Sister    Social History:  reports that she quit smoking about 40 years ago. Her smoking use included Cigarettes. She has a 11.25 pack-year smoking history. She has never used smokeless tobacco. She reports that she does not drink alcohol or use illicit drugs.  Allergies:  Allergies  Allergen Reactions  . Chocolate     Numb feeling around mouth   . Chocolate   . Peanuts [Peanut Oil] Itching  . Peanuts [Peanut Oil]     hives  . Penicillins     Unknown   . Penicillins     unknown    Medications Prior to Admission  Medication Sig Dispense Refill  . acetaminophen (TYLENOL) 650 MG CR tablet Take 650 mg by mouth  every 8 (eight) hours as needed for pain.    [name] apixaban (ELIQUIS) 5 MG TABS tablet Take 1 tablet (5 mg total) by mouth 2 (two) times daily. 60 tablet 11  . bisacodyl (DULCOLAX) 5 MG EC tablet Take 5 mg by mouth as needed.    . calcium carbonate (TUMS - DOSED IN MG ELEMENTAL CALCIUM) 500 MG chewable tablet Chew 1 tablet by mouth daily as needed for indigestion or heartburn.    . cyanocobalamin (,VITAMIN B-12,) 1000 MCG/ML injection Inject 1,000 mcg into the muscle every 14 (fourteen) days.    . digoxin (LANOXIN) 0.125 MG tablet Take 1 tablet (0.125 mg total) by mouth daily. 90 tablet 3  . diltiazem (CARDIZEM CD) 240 MG 24 hr capsule Take 1 capsule (240 mg total) by mouth every morning.    . diltiazem (DILTIAZEM CD) 120 MG 24 hr capsule Take 1 capsule (120 mg total) by mouth every evening.  30 capsule 6  . docusate sodium (COLACE) 100 MG capsule Take 100 mg by mouth 2 (two) times daily.    . metoprolol (LOPRESSOR) 50 MG tablet Take 1 tablet (50 mg total) by mouth 2 (two) times daily. 60 tablet 5  . pantoprazole (PROTONIX) 40 MG tablet Take 40 mg by mouth daily.    . Vitamin D, Ergocalciferol, (DRISDOL) 50000 UNITS CAPS capsule Take 50,000 Units by mouth every 7 (seven) days. Mondays      No results found for this or any previous visit (from the past 48 hour(s)). No results found.  ROS  There were no vitals taken for this visit. Physical Exam   Assessment/Plan Recurrent abdominal pain and bloating. Hydrogen breath test looking for small intestinal bacterial overgrowth.  [name] U 08/13/2014, 12:06 PM

## 2014-08-15 ENCOUNTER — Telehealth (INDEPENDENT_AMBULATORY_CARE_PROVIDER_SITE_OTHER): Payer: Self-pay | Admitting: Internal Medicine

## 2014-08-15 DIAGNOSIS — R143 Flatulence: Secondary | ICD-10-CM | POA: Diagnosis not present

## 2014-08-15 DIAGNOSIS — Z9889 Other specified postprocedural states: Secondary | ICD-10-CM

## 2014-08-15 DIAGNOSIS — R12 Heartburn: Secondary | ICD-10-CM | POA: Diagnosis not present

## 2014-08-15 DIAGNOSIS — K219 Gastro-esophageal reflux disease without esophagitis: Secondary | ICD-10-CM | POA: Diagnosis not present

## 2014-08-15 DIAGNOSIS — R109 Unspecified abdominal pain: Secondary | ICD-10-CM | POA: Diagnosis not present

## 2014-08-15 MED ORDER — CIPROFLOXACIN HCL 500 MG PO TABS: 500.0000 mg | ORAL_TABLET | Freq: Two times a day (BID) | ORAL | Status: DC

## 2014-08-15 NOTE — Op Note (Addendum)
Op Note    Expand All Collapse All   NAME: Terrianne Steinhaus ACCOUNT NO.: [id number]  MEDICAL RECORD NO. [id number]  PATIENT TYPE: AMB  LOCATION: DAY FACILITY: [facility]  PHYSICIAN: Hildred Laser, M.D. DATE OF BIRTH: Jan 28, 1935  DATE OF PROCEDURE: 08/13/2014    OPERATIVE REPORT  PROCEDURE: Bacterial overgrowth study.  INDICATION: Ms. Gosney is  80 year old Caucasian female presents with recurrent abdominal pain and bloating. She has not responded to PPI and anti-spasmodic. EGD and abdominopelvic CT were unremarkable. She was treated with Z-Pak and Cipro for sinusitis and GI symptoms resolved completely and symptoms are now back with full force. She is therefore undergoing hydrogen breath test to determine if she has small intestinal bacterial overgrowth.  PROCEDURE: The patient followed and met all the pre-conditions in the study protocol.  She was given 25 g of lactulose in 8 ounces of water 30 minutes before the procedure.  Alveolar air sample were collected every 15 minutes for a total of 180 minutes over 12 samples in all.  FINDINGS: Alveolar hydrogen concentration at 15 minutes for a total of 180 minutes and 12 samples in all.  FINDINGS: Alveolar hydrogen concentration as below  Time Baseline 15 mins 30 mins 45 mins 60 mins 75 mins 90 mins 105 mins 120 mins 135 mins 150 mins 165 mins 180 mins  H2-ppm 007 010 011 007 [phone]                ASSESSMENT: Normal study ruling out small intestinal bacterial overgrowth.  PLAN: Empiric therapy with Cipro 500 mg by mouth twice a day for 10 days.     Hildred Laser, M.D.

## 2014-08-15 NOTE — Telephone Encounter (Signed)
Patient called with results of hydrogen breath test which does not confirm SIBO. Patient states she is having frequent abdominal pain and extreme bloating. Her symptoms resolved completely when she was treated with Z-Pak followed by Cipro for sinusitis earlier this year. Will retreat with Cipro for 10 days. Patient advised to keep daily symptom diary and call as on day 11.

## 2014-08-16 ENCOUNTER — Encounter (HOSPITAL_COMMUNITY): Payer: Self-pay | Admitting: Internal Medicine

## 2014-09-02 ENCOUNTER — Encounter: Payer: Self-pay | Admitting: Cardiovascular Disease

## 2014-09-02 ENCOUNTER — Ambulatory Visit (INDEPENDENT_AMBULATORY_CARE_PROVIDER_SITE_OTHER): Payer: Medicare HMO | Admitting: Cardiovascular Disease

## 2014-09-02 VITALS — BP 112/84 | HR 55 | Ht 64.0 in | Wt 148.0 lb

## 2014-09-02 DIAGNOSIS — I482 Chronic atrial fibrillation, unspecified: Secondary | ICD-10-CM

## 2014-09-02 DIAGNOSIS — I429 Cardiomyopathy, unspecified: Secondary | ICD-10-CM | POA: Diagnosis not present

## 2014-09-02 DIAGNOSIS — I340 Nonrheumatic mitral (valve) insufficiency: Secondary | ICD-10-CM | POA: Diagnosis not present

## 2014-09-02 DIAGNOSIS — R5383 Other fatigue: Secondary | ICD-10-CM

## 2014-09-02 DIAGNOSIS — I10 Essential (primary) hypertension: Secondary | ICD-10-CM

## 2014-09-02 DIAGNOSIS — Z7189 Other specified counseling: Secondary | ICD-10-CM

## 2014-09-02 DIAGNOSIS — R110 Nausea: Secondary | ICD-10-CM

## 2014-09-02 DIAGNOSIS — R0609 Other forms of dyspnea: Secondary | ICD-10-CM

## 2014-09-02 MED ORDER — WARFARIN SODIUM 5 MG PO TABS: 5.0000 mg | ORAL_TABLET | Freq: Every day | ORAL | Status: DC

## 2014-09-02 NOTE — Patient Instructions (Addendum)
   Stop Eliquis.   Begin Warfarin 5mg  every evening.   Continue all other medications.   Your physician wants you to follow up in: 6 months.  You will receive a reminder letter in the mail one-two months in advance.  If you don't receive a letter, please call our office to schedule the follow up appointment    Patient states that she will be finishing her current supply of her Eliquis first.  Should be done around the end of the month.  I have asked her to call the office prior to beginning so we can review instructions on changing from Eliquis to the Warfarin.  Also, will need to schedule new Coumadin with Lattie Haw.    Patient should continue her Eliquis for 2 days after beginning the Warfarin.  This will give overlapping coverage till her Warfarin is therapeutic.

## 2014-09-02 NOTE — Progress Notes (Signed)
Patient ID: Lori David, female   DOB: 1934/06/23, 80 y.o.   MRN: [id number]      SUBJECTIVE: The patient presents for follow-up of rapid atrial fibrillation and cardiomyopathy (EF 40-45% on 03/23/14).  She denies chest pain and palpitations. She sometimes feels short of breath with little exertion. She denies orthopnea, paroxysmal nocturnal dyspnea, and leg swelling.  She has been seeing gastroenterology and she has been struggling with nausea. Her GI physician questioned whether apixaban could be the cause.  She does feel that her symptoms with respect to her heart have improved with the addition of digoxin.  She is here with her brother, Lori David, who is also my patient.   Review of Systems: As per "subjective", otherwise negative.  Allergies  Allergen Reactions  . Chocolate     Numb feeling around mouth   . Chocolate   . Peanuts [Peanut Oil] Itching  . Peanuts [Peanut Oil]     hives  . Penicillins     Unknown   . Penicillins     unknown    Current Outpatient Prescriptions  Medication Sig Dispense Refill  . acetaminophen (TYLENOL) 650 MG CR tablet Take 650 mg by mouth every 8 (eight) hours as needed for pain.    [name] apixaban (ELIQUIS) 5 MG TABS tablet Take 1 tablet (5 mg total) by mouth 2 (two) times daily. 60 tablet 11  . bisacodyl (DULCOLAX) 5 MG EC tablet Take 5 mg by mouth as needed.    . calcium carbonate (TUMS - DOSED IN MG ELEMENTAL CALCIUM) 500 MG chewable tablet Chew 1 tablet by mouth daily as needed for indigestion or heartburn.    . ciprofloxacin (CIPRO) 500 MG tablet Take 1 tablet (500 mg total) by mouth 2 (two) times daily. 20 tablet 0  . cyanocobalamin (,VITAMIN B-12,) 1000 MCG/ML injection Inject 1,000 mcg into the muscle every 14 (fourteen) days.    . digoxin (LANOXIN) 0.125 MG tablet Take 1 tablet (0.125 mg total) by mouth daily. 90 tablet 3  . diltiazem (CARDIZEM CD) 240 MG 24 hr capsule Take 1 capsule (240 mg total) by mouth every morning.    .  diltiazem (DILTIAZEM CD) 120 MG 24 hr capsule Take 1 capsule (120 mg total) by mouth every evening. 30 capsule 6  . docusate sodium (COLACE) 100 MG capsule Take 100 mg by mouth 2 (two) times daily.    . metoprolol (LOPRESSOR) 50 MG tablet Take 1 tablet (50 mg total) by mouth 2 (two) times daily. 60 tablet 5  . pantoprazole (PROTONIX) 40 MG tablet Take 40 mg by mouth daily.    . Vitamin D, Ergocalciferol, (DRISDOL) 50000 UNITS CAPS capsule Take 50,000 Units by mouth every 7 (seven) days. Mondays     No current facility-administered medications for this visit.    Past Medical History  Diagnosis Date  . Seasonal allergies   . Heart valve disease     leaking heart valve  . Hypertension   . Osteopenia   . GERD (gastroesophageal reflux disease)   . Hypercholesterolemia   . Atrial flutter     a. Pt reports episode 02/2013 noted pre-op cataracts but was in NSR at time of f/u. Saw cardiology and was told she had a leaky heart valve.  . Valvular heart disease   . GI problem     a. Pt reports workup for abdominal sensitivity with unremarkable endoscopy and CT.  [name] Family history of adverse reaction to anesthesia     "some people in  my family have; don't know what happened though" (03/22/2014)  . Arthritis     "in my back/gastro dr" (03/22/2014)    Past Surgical History  Procedure Laterality Date  . Cholecystectomy  2000?    [facility]  . Tonsillectomy  1943    [facility]-no longer present  . Cataract extraction w/phaco Right 06/30/2013    Procedure: CATARACT EXTRACTION PHACO AND INTRAOCULAR LENS PLACEMENT (IOC);  Surgeon: Elta Guadeloupe T. Gershon Crane, MD;  Location: [facility];  Service: Ophthalmology;  Laterality: Right;  CDE:7.99  . Cataract extraction w/phaco Left 07/14/2013    Procedure: CATARACT EXTRACTION PHACO AND INTRAOCULAR LENS PLACEMENT (IOC);  Surgeon: Elta Guadeloupe T. Gershon Crane, MD;  Location: [facility];  Service: Ophthalmology;  Laterality: Left;  CDE:  8.25  . Esophagogastroduodenoscopy N/A 01/20/2014     Procedure: ESOPHAGOGASTRODUODENOSCOPY (EGD);  Surgeon: Rogene Houston, MD;  Location: [facility];  Service: Endoscopy;  Laterality: N/A;  120 - moved to 12:25 - [name] to notify pt  . Cataract extraction w/ intraocular lens  implant, bilateral Bilateral 2015  . Tonsillectomy  ~ 1943  . Laparoscopic cholecystectomy  ~ 2000  . Bacterial overgrowth test N/A 08/13/2014    Procedure: BACTERIAL OVERGROWTH TEST;  Surgeon: Rogene Houston, MD;  Location: [facility];  Service: Endoscopy;  Laterality: N/A;  730    History   Social History  . Marital Status: Single    Spouse Name: N/A  . Number of Children: N/A  . Years of Education: N/A   Occupational History  . Not on file.   Social History Main Topics  . Smoking status: Former Smoker -- 0.75 packs/day for 15 years    Types: Cigarettes    Quit date: 03/18/1974  . Smokeless tobacco: Never Used     Comment: "quit smoking cigarettes in the 1970's"  . Alcohol Use: No  . Drug Use: No  . Sexual Activity: No   Other Topics Concern  . Not on file   Social History Narrative   ** Merged History Encounter **         Filed Vitals:   09/02/14 1406  BP: 112/84  Pulse: 55  Height: 5\' 4"  (1.626 m)  Weight: 148 lb (67.132 kg)  SpO2: 97%   HR by auscultation: 72-80 bpm  PHYSICAL EXAM General: NAD HEENT: Normal. Neck: No JVD, no thyromegaly. Lungs: Clear to auscultation bilaterally with normal respiratory effort. CV: Nondisplaced PMI. Regular rate, irregular rhythm, normal S1/S2, no S3, no murmur. No pretibial or periankle edema. No carotid bruit. Normal pedal pulses.  Abdomen: Soft, nontender, no hepatosplenomegaly, no distention.  Neurologic: Alert and oriented x 3.  Psych: Normal affect. Skin: Normal. Musculoskeletal: Normal range of motion, no gross deformities. Extremities: No clubbing or cyanosis.   ECG: Most recent ECG reviewed.    ASSESSMENT AND PLAN: 1. Atrial fibrillation: Symptomatically improved. Given  problems with nausea, as per her request will switch Eliquis to warfarin. Will enroll in anticoagulation clinic.   2. Cardiomyopathy (nonischemic), EF 40-45%: No signs of heart failure. Hopefully, with good HR control, LV function will eventually normalize. Continue digoxin, metoprolol, and diltiazem.  3. Moderate mitral regurgitation: Stable. Will continue to monitor clinically.  4. Essential HTN: Well controlled on current therapy. No changes.  5. Nausea: Switch from apixaban to warfarin.  Dispo: f/u 6 months.  Kate Sable, M.D., F.A.C.C.

## 2014-09-06 ENCOUNTER — Telehealth (INDEPENDENT_AMBULATORY_CARE_PROVIDER_SITE_OTHER): Payer: Self-pay | Admitting: *Deleted

## 2014-09-06 NOTE — Telephone Encounter (Signed)
The Cipro worked but no as well as other medications. She is better but not well. Still having the gas, bloating, burning in the stomach area and esophagus that makes it painful. Her return phone number is [phone].

## 2014-09-09 ENCOUNTER — Telehealth: Payer: Self-pay | Admitting: Cardiovascular Disease

## 2014-09-09 NOTE — Telephone Encounter (Signed)
Noted  

## 2014-09-09 NOTE — Telephone Encounter (Signed)
Lori David states that she has not started her coumdin yet.  She said that when she starts the coumdin she will call our office To set up an appointment with our coumdin nurse.

## 2014-09-09 NOTE — Telephone Encounter (Signed)
Dr.Rehman has been made aware of this. Will followup with him on this.

## 2014-09-10 NOTE — Telephone Encounter (Signed)
Dr.Rehman - Do you have any further recommendations for patient?

## 2014-09-11 NOTE — Telephone Encounter (Signed)
Call returned. Patient has bloating and cramping usually at night and no problems during the daytime and she has constipation. Patient will try IBgard 1 capsule after lunch and evening meal for 7-10 days. If she feels no better will eliminate gluten from her diet and if that doesn't help and do CT angiogram abdomen.

## 2014-09-13 NOTE — Telephone Encounter (Signed)
Noted  

## 2014-09-16 ENCOUNTER — Other Ambulatory Visit: Payer: Self-pay | Admitting: Physician Assistant

## 2014-09-16 NOTE — Telephone Encounter (Signed)
Rx has been sent to the pharmacy electronically. ° °

## 2014-09-28 ENCOUNTER — Ambulatory Visit (INDEPENDENT_AMBULATORY_CARE_PROVIDER_SITE_OTHER): Payer: Medicare HMO | Admitting: *Deleted

## 2014-09-28 DIAGNOSIS — I4891 Unspecified atrial fibrillation: Secondary | ICD-10-CM

## 2014-09-28 DIAGNOSIS — Z5181 Encounter for therapeutic drug level monitoring: Secondary | ICD-10-CM | POA: Diagnosis not present

## 2014-09-28 LAB — POCT INR: INR: 3.4

## 2014-10-07 ENCOUNTER — Ambulatory Visit (INDEPENDENT_AMBULATORY_CARE_PROVIDER_SITE_OTHER): Payer: Medicare HMO | Admitting: *Deleted

## 2014-10-07 DIAGNOSIS — Z5181 Encounter for therapeutic drug level monitoring: Secondary | ICD-10-CM | POA: Diagnosis not present

## 2014-10-07 DIAGNOSIS — I4891 Unspecified atrial fibrillation: Secondary | ICD-10-CM | POA: Diagnosis not present

## 2014-10-07 LAB — POCT INR: INR: 2.9

## 2014-10-22 ENCOUNTER — Other Ambulatory Visit: Payer: Self-pay | Admitting: Cardiovascular Disease

## 2014-10-26 ENCOUNTER — Ambulatory Visit (INDEPENDENT_AMBULATORY_CARE_PROVIDER_SITE_OTHER): Payer: Medicare HMO | Admitting: *Deleted

## 2014-10-26 DIAGNOSIS — Z5181 Encounter for therapeutic drug level monitoring: Secondary | ICD-10-CM | POA: Diagnosis not present

## 2014-10-26 DIAGNOSIS — I4891 Unspecified atrial fibrillation: Secondary | ICD-10-CM | POA: Diagnosis not present

## 2014-10-26 LAB — POCT INR: INR: 2.2

## 2014-11-10 ENCOUNTER — Other Ambulatory Visit: Payer: Self-pay | Admitting: Cardiovascular Disease

## 2014-11-16 ENCOUNTER — Ambulatory Visit (INDEPENDENT_AMBULATORY_CARE_PROVIDER_SITE_OTHER): Payer: Medicare HMO | Admitting: *Deleted

## 2014-11-16 DIAGNOSIS — I4891 Unspecified atrial fibrillation: Secondary | ICD-10-CM | POA: Diagnosis not present

## 2014-11-16 DIAGNOSIS — Z5181 Encounter for therapeutic drug level monitoring: Secondary | ICD-10-CM

## 2014-11-16 LAB — POCT INR: INR: 2.4

## 2014-11-30 ENCOUNTER — Encounter (INDEPENDENT_AMBULATORY_CARE_PROVIDER_SITE_OTHER): Payer: Self-pay | Admitting: Internal Medicine

## 2014-11-30 ENCOUNTER — Ambulatory Visit (INDEPENDENT_AMBULATORY_CARE_PROVIDER_SITE_OTHER): Payer: Medicare HMO | Admitting: Internal Medicine

## 2014-11-30 VITALS — BP 108/70 | HR 66 | Temp 97.3°F | Resp 18 | Ht 64.0 in | Wt 147.7 lb

## 2014-11-30 DIAGNOSIS — K219 Gastro-esophageal reflux disease without esophagitis: Secondary | ICD-10-CM

## 2014-11-30 DIAGNOSIS — R140 Abdominal distension (gaseous): Secondary | ICD-10-CM

## 2014-11-30 DIAGNOSIS — K5900 Constipation, unspecified: Secondary | ICD-10-CM

## 2014-11-30 MED ORDER — LUBIPROSTONE 8 MCG PO CAPS: 8.0000 ug | ORAL_CAPSULE | Freq: Every day | ORAL | Status: DC

## 2014-11-30 NOTE — Progress Notes (Signed)
Presenting complaint;  Follow-up for GERD bloating and constipation.  Subjective:  Patient is 80 year old Caucasian female who is here for scheduled visit. She was last seen on 05/18/2014. She continues to complain of abdominal bloating and pressure across the mid and lower abdomen. These symptoms are more pronounced at night and prevents her from sleeping. She also complains of intermittent burning in her lower chest. She says she is not having this as often as she used to. She has sporadic nausea but no vomiting. She continues to complain of constipation. She states she had a period few weeks where her bowels move very regularly. Even though she is having a bowel movement every day. She does not have a sense of complete evacuation more than 50% of the time. She denies melena or rectal bleeding. She wonders if change in her bowel habits is due to warfarin which she was begun on 2 months ago when Eliquis was stopped. She has lost only 1 pound since her last visit. The last several months she has lost 5 pounds. She takes Dulcolax once or twice a month. She also complains of fatigue and weakness. She's had these symptoms since she was begun on metoprolol. She says she gets tired after taking a bath so that she has to sit down for a few minutes and rest. She had hydrogen breath test about 4 months ago and was normal. She was treated with Cipro for 10 days but states it did not ameliorate her bloating.   Current Medications: Outpatient Encounter Prescriptions as of 11/30/2014  Medication Sig  . acetaminophen (TYLENOL) 650 MG CR tablet Take 650 mg by mouth every 8 (eight) hours as needed for pain.  . bisacodyl (DULCOLAX) 5 MG EC tablet Take 5 mg by mouth as needed.  . calcium carbonate (TUMS - DOSED IN MG ELEMENTAL CALCIUM) 500 MG chewable tablet Chew 1 tablet by mouth daily as needed for indigestion or heartburn.  . cyanocobalamin (,VITAMIN B-12,) 1000 MCG/ML injection Inject 1,000 mcg into the muscle  every 14 (fourteen) days.  . digoxin (LANOXIN) 0.125 MG tablet Take 1 tablet (0.125 mg total) by mouth daily.  [name] diltiazem (CARDIZEM CD) 120 MG 24 hr capsule TAKE (1) CAPSULE DAILY IN THE EVENING.  [name] diltiazem (CARDIZEM CD) 240 MG 24 hr capsule Take 1 capsule (240 mg total) by mouth every morning.  . docusate sodium (COLACE) 100 MG capsule Take 100 mg by mouth 2 (two) times daily.  . metoprolol (LOPRESSOR) 50 MG tablet TAKE (1) TABLET TWICE DAILY.  . pantoprazole (PROTONIX) 40 MG tablet Take 40 mg by mouth daily.  . Vitamin D, Ergocalciferol, (DRISDOL) 50000 UNITS CAPS capsule Take 50,000 Units by mouth every 7 (seven) days. Mondays  . warfarin (COUMADIN) 5 MG tablet TAKE (1) TABLET BY MOUTH ONCE DAILY.   No facility-administered encounter medications on file as of 11/30/2014.     Objective: Blood pressure 108/70, pulse 66, temperature 97.3 F (36.3 C), temperature source Oral, resp. rate 18, height 5\' 4"  (1.626 m), weight 147 lb 11.2 oz (66.996 kg). Patient is alert and in no acute distress. Conjunctiva is pink. Sclera is nonicteric Oropharyngeal mucosa is normal. No neck masses or thyromegaly noted. Cardiac exam with irregular rhythm normal S1 and S2. No murmur or gallop noted. Lungs are clear to auscultation. Abdomen is symmetrical. Bowel sounds are normal. On palpation abdomen is soft with mild midepigastric tenderness. No organomegaly or masses. No LE edema or clubbing noted.    Assessment:  #1. GERD. Symptom  control has improved since her last visit. #2. Bloating. This symptom appears to be affecting quality of life as she is not able to sleep at night. Hydrogen breath test was negative for small intestinal bacterial overgrowth in June 2016 and she did not respond to empiric anti-biotic therapy. Hopefully treatment of constipation would help this symptom. #3. Constipation. Dietary measures and stool softener not helping and she is using Dulcolax when  necessary   Plan:  Discontinue Dulcolax. Linzess 8-16 g by mouth every morning. Phazyme 1-2 capsules by mouth twice a day when necessary. Patient advised to call with progress report in one month. Office visit in 3 months.

## 2014-11-30 NOTE — Patient Instructions (Signed)
Can take Phazyme 1-2 capsules twice daily as needed for bloating. Can increase Amitiza dose to 16 g after 1 week if 8 g does not work Call office with progress report when you're close to finishing Amitiza samples.

## 2014-12-14 ENCOUNTER — Ambulatory Visit (INDEPENDENT_AMBULATORY_CARE_PROVIDER_SITE_OTHER): Payer: Medicare HMO | Admitting: *Deleted

## 2014-12-14 DIAGNOSIS — I4891 Unspecified atrial fibrillation: Secondary | ICD-10-CM | POA: Diagnosis not present

## 2014-12-14 DIAGNOSIS — Z5181 Encounter for therapeutic drug level monitoring: Secondary | ICD-10-CM | POA: Diagnosis not present

## 2014-12-14 LAB — POCT INR: INR: 2.9

## 2015-01-25 ENCOUNTER — Ambulatory Visit (INDEPENDENT_AMBULATORY_CARE_PROVIDER_SITE_OTHER): Payer: Medicare HMO | Admitting: *Deleted

## 2015-01-25 DIAGNOSIS — Z5181 Encounter for therapeutic drug level monitoring: Secondary | ICD-10-CM | POA: Diagnosis not present

## 2015-01-25 DIAGNOSIS — I4891 Unspecified atrial fibrillation: Secondary | ICD-10-CM | POA: Diagnosis not present

## 2015-01-25 LAB — POCT INR: INR: 2

## 2015-02-08 ENCOUNTER — Other Ambulatory Visit: Payer: Self-pay | Admitting: Physician Assistant

## 2015-02-08 ENCOUNTER — Other Ambulatory Visit: Payer: Self-pay | Admitting: Cardiovascular Disease

## 2015-02-08 NOTE — Telephone Encounter (Signed)
REFILL 

## 2015-02-17 ENCOUNTER — Other Ambulatory Visit: Payer: Self-pay | Admitting: Cardiovascular Disease

## 2015-03-08 ENCOUNTER — Encounter: Payer: Self-pay | Admitting: Cardiovascular Disease

## 2015-03-08 ENCOUNTER — Ambulatory Visit (INDEPENDENT_AMBULATORY_CARE_PROVIDER_SITE_OTHER): Payer: Medicare HMO | Admitting: Cardiovascular Disease

## 2015-03-08 ENCOUNTER — Ambulatory Visit (INDEPENDENT_AMBULATORY_CARE_PROVIDER_SITE_OTHER): Payer: Medicare HMO | Admitting: Pharmacist

## 2015-03-08 ENCOUNTER — Ambulatory Visit (INDEPENDENT_AMBULATORY_CARE_PROVIDER_SITE_OTHER): Payer: Self-pay | Admitting: Internal Medicine

## 2015-03-08 VITALS — BP 122/78 | HR 71 | Ht 64.0 in | Wt 155.0 lb

## 2015-03-08 DIAGNOSIS — Z5181 Encounter for therapeutic drug level monitoring: Secondary | ICD-10-CM | POA: Diagnosis not present

## 2015-03-08 DIAGNOSIS — I429 Cardiomyopathy, unspecified: Secondary | ICD-10-CM | POA: Diagnosis not present

## 2015-03-08 DIAGNOSIS — I4891 Unspecified atrial fibrillation: Secondary | ICD-10-CM | POA: Diagnosis not present

## 2015-03-08 DIAGNOSIS — I482 Chronic atrial fibrillation, unspecified: Secondary | ICD-10-CM

## 2015-03-08 DIAGNOSIS — I10 Essential (primary) hypertension: Secondary | ICD-10-CM

## 2015-03-08 DIAGNOSIS — I340 Nonrheumatic mitral (valve) insufficiency: Secondary | ICD-10-CM | POA: Diagnosis not present

## 2015-03-08 LAB — POCT INR: INR: 2.4

## 2015-03-08 NOTE — Progress Notes (Signed)
Patient ID: Lori David, female   DOB: 1934/10/23, 81 y.o.   MRN: [id number]      SUBJECTIVE: The patient presents for follow-up of rapid atrial fibrillation and cardiomyopathy (EF 40-45% on 03/23/14).  She denies chest pain and palpitations. She is feeling much better. She feels less fatigued.  ECG shows controlled atrial fibrillation, HR 63 bpm.  Soc: Her brother, Emelinda Marble, is also my patient.   Review of Systems: As per "subjective", otherwise negative.  Allergies  Allergen Reactions  . Chocolate     Numb feeling around mouth   . Chocolate   . Peanuts [Peanut Oil] Itching  . Peanuts [Peanut Oil]     hives  . Penicillins     Unknown   . Penicillins     unknown    Current Outpatient Prescriptions  Medication Sig Dispense Refill  . acetaminophen (TYLENOL) 650 MG CR tablet Take 650 mg by mouth every 8 (eight) hours as needed for pain.    . calcium carbonate (TUMS - DOSED IN MG ELEMENTAL CALCIUM) 500 MG chewable tablet Chew 1 tablet by mouth daily as needed for indigestion or heartburn.    . cyanocobalamin (,VITAMIN B-12,) 1000 MCG/ML injection Inject 1,000 mcg into the muscle every 14 (fourteen) days.    [name] DIGOX 125 MCG tablet TAKE (1) TABLET BY MOUTH ONCE DAILY. 30 tablet 3  . diltiazem (CARDIZEM CD) 120 MG 24 hr capsule TAKE (1) CAPSULE DAILY IN THE EVENING. 30 capsule 6  . diltiazem (CARDIZEM CD) 240 MG 24 hr capsule Take 1 capsule (240 mg total) by mouth every morning.    . docusate sodium (COLACE) 100 MG capsule Take 100 mg by mouth 2 (two) times daily.    . metoprolol (LOPRESSOR) 50 MG tablet TAKE (1) TABLET TWICE DAILY. 60 tablet 6  . pantoprazole (PROTONIX) 40 MG tablet Take 40 mg by mouth daily.    . Vitamin D, Ergocalciferol, (DRISDOL) 50000 UNITS CAPS capsule Take 50,000 Units by mouth every 7 (seven) days. Mondays    . warfarin (COUMADIN) 5 MG tablet TAKE (1) TABLET BY MOUTH ONCE DAILY. 30 tablet 1   No current facility-administered medications for this  visit.    Past Medical History  Diagnosis Date  . Seasonal allergies   . Heart valve disease     leaking heart valve  . Hypertension   . Osteopenia   . GERD (gastroesophageal reflux disease)   . Hypercholesterolemia   . Atrial flutter ([place])     a. Pt reports episode 02/2013 noted pre-op cataracts but was in NSR at time of f/u. Saw cardiology and was told she had a leaky heart valve.  . Valvular heart disease   . GI problem     a. Pt reports workup for abdominal sensitivity with unremarkable endoscopy and CT.  [name] Family history of adverse reaction to anesthesia     "some people in my family have; don't know what happened though" (03/22/2014)  . Arthritis     "in my back/gastro dr" (03/22/2014)    Past Surgical History  Procedure Laterality Date  . Cholecystectomy  2000?    [facility]  . Tonsillectomy  1943    [facility]-no longer present  . Cataract extraction w/phaco Right 06/30/2013    Procedure: CATARACT EXTRACTION PHACO AND INTRAOCULAR LENS PLACEMENT (IOC);  Surgeon: Elta Guadeloupe T. Gershon Crane, MD;  Location: [facility];  Service: Ophthalmology;  Laterality: Right;  CDE:7.99  . Cataract extraction w/phaco Left 07/14/2013    Procedure: CATARACT EXTRACTION  PHACO AND INTRAOCULAR LENS PLACEMENT (IOC);  Surgeon: Elta Guadeloupe T. Gershon Crane, MD;  Location: [facility];  Service: Ophthalmology;  Laterality: Left;  CDE:  8.25  . Esophagogastroduodenoscopy N/A 01/20/2014    Procedure: ESOPHAGOGASTRODUODENOSCOPY (EGD);  Surgeon: Rogene Houston, MD;  Location: [facility];  Service: Endoscopy;  Laterality: N/A;  120 - moved to 12:25 - [name] to notify pt  . Cataract extraction w/ intraocular lens  implant, bilateral Bilateral 2015  . Tonsillectomy  ~ 1943  . Laparoscopic cholecystectomy  ~ 2000  . Bacterial overgrowth test N/A 08/13/2014    Procedure: BACTERIAL OVERGROWTH TEST;  Surgeon: Rogene Houston, MD;  Location: [facility];  Service: Endoscopy;  Laterality: N/A;  730    Social History   Social History    . Marital Status: Single    Spouse Name: N/A  . Number of Children: N/A  . Years of Education: N/A   Occupational History  . Not on file.   Social History Main Topics  . Smoking status: Former Smoker -- 0.75 packs/day for 15 years    Types: Cigarettes    Quit date: 03/18/1974  . Smokeless tobacco: Never Used     Comment: "quit smoking cigarettes in the 1970's"  . Alcohol Use: No  . Drug Use: No  . Sexual Activity: No   Other Topics Concern  . Not on file   Social History Narrative   ** Merged History Encounter **         Filed Vitals:   03/08/15 1343  BP: 122/78  Pulse: 71  Height: 5\' 4"  (1.626 m)  Weight: 155 lb (70.308 kg)  SpO2: 98%    PHYSICAL EXAM General: NAD HEENT: Normal. Neck: No JVD, no thyromegaly. Lungs: Clear to auscultation bilaterally with normal respiratory effort. CV: Nondisplaced PMI. Regular rate, irregular rhythm, normal S1/S2, no S3, no murmur. No pretibial or periankle edema. Abdomen: Soft, nontender. Psych: Normal affect. Skin: Normal. Musculoskeletal: No gross deformities. Extremities: No clubbing or cyanosis.   ECG: Most recent ECG reviewed.      ASSESSMENT AND PLAN: 1. Atrial fibrillation: Symptomatically stable on diltiazem and digoxin. Continue warfarin and follow up in anticoagulation clinic.   2. Cardiomyopathy (nonischemic), EF 40-45%: No signs of heart failure. Hopefully, with good HR control, LV function will eventually normalize. Continue digoxin, metoprolol, and diltiazem.  3. Moderate mitral regurgitation: Stable. Will continue to monitor clinically.  4. Essential HTN: Well controlled on current therapy. No changes.  Dispo: f/u 6 months.  Kate Sable, M.D., F.A.C.C.

## 2015-03-08 NOTE — Patient Instructions (Signed)
Continue all current medications. Your physician wants you to follow up in: 6 months.  You will receive a reminder letter in the mail one-two months in advance.  If you don't receive a letter, please call our office to schedule the follow up appointment   

## 2015-03-11 ENCOUNTER — Other Ambulatory Visit: Payer: Self-pay | Admitting: Physician Assistant

## 2015-03-11 ENCOUNTER — Other Ambulatory Visit: Payer: Self-pay | Admitting: Cardiovascular Disease

## 2015-03-11 ENCOUNTER — Other Ambulatory Visit (INDEPENDENT_AMBULATORY_CARE_PROVIDER_SITE_OTHER): Payer: Self-pay | Admitting: Internal Medicine

## 2015-04-12 ENCOUNTER — Other Ambulatory Visit: Payer: Self-pay | Admitting: Cardiovascular Disease

## 2015-04-19 ENCOUNTER — Ambulatory Visit (INDEPENDENT_AMBULATORY_CARE_PROVIDER_SITE_OTHER): Payer: Medicare HMO | Admitting: *Deleted

## 2015-04-19 DIAGNOSIS — I4891 Unspecified atrial fibrillation: Secondary | ICD-10-CM | POA: Diagnosis not present

## 2015-04-19 DIAGNOSIS — Z5181 Encounter for therapeutic drug level monitoring: Secondary | ICD-10-CM

## 2015-04-19 LAB — POCT INR: INR: 2.8

## 2015-04-26 ENCOUNTER — Ambulatory Visit (INDEPENDENT_AMBULATORY_CARE_PROVIDER_SITE_OTHER): Payer: Medicare HMO | Admitting: Internal Medicine

## 2015-04-26 ENCOUNTER — Encounter (INDEPENDENT_AMBULATORY_CARE_PROVIDER_SITE_OTHER): Payer: Self-pay | Admitting: Internal Medicine

## 2015-04-26 VITALS — BP 110/70 | HR 68 | Temp 97.0°F | Resp 18 | Ht 64.0 in | Wt 151.7 lb

## 2015-04-26 DIAGNOSIS — K219 Gastro-esophageal reflux disease without esophagitis: Secondary | ICD-10-CM

## 2015-04-26 DIAGNOSIS — R140 Abdominal distension (gaseous): Secondary | ICD-10-CM | POA: Diagnosis not present

## 2015-04-26 DIAGNOSIS — K5900 Constipation, unspecified: Secondary | ICD-10-CM

## 2015-04-26 MED ORDER — SENNA-DOCUSATE SODIUM 8.6-50 MG PO TABS: 1.0000 | ORAL_TABLET | Freq: Two times a day (BID) | ORAL | Status: AC

## 2015-04-26 MED ORDER — BENEFIBER DRINK MIX PO PACK
4.0000 g | PACK | Freq: Every day | ORAL | Status: DC
Start: 2015-04-26 — End: 2018-06-24

## 2015-04-26 NOTE — Patient Instructions (Signed)
Please call with progress report in one month.

## 2015-04-26 NOTE — Progress Notes (Signed)
Presenting complaint;  Follow-up for GERD bloating and constipation.  Subjective:  Patient is 81 year old Caucasian female who is in for scheduled visit. She was last seen on 11/30/2014. She continues to complain of bloating which is worse in the afternoon and evening but she has some good days. On her last visit she was given Amitiza for constipation but it did not help. She continues to complain of being constipated and not having good evacuation. Every now and then she does have normal bowel movements. She states she went 3 weeks without any difficulty but now she's back to square one. She denies melena or rectal bleeding. She has heartburn with certain foods but not frequently. Her appetite is up and down. She has gained 4 pounds last visit. She denies nausea vomiting or dysphagia.   Current Medications: Outpatient Encounter Prescriptions as of 04/26/2015  Medication Sig  . acetaminophen (TYLENOL) 650 MG CR tablet Take 650 mg by mouth every 8 (eight) hours as needed for pain.  . calcium carbonate (TUMS - DOSED IN MG ELEMENTAL CALCIUM) 500 MG chewable tablet Chew 1 tablet by mouth daily as needed for indigestion or heartburn.  . cyanocobalamin (,VITAMIN B-12,) 1000 MCG/ML injection Inject 1,000 mcg into the muscle every 14 (fourteen) days.  [name] DIGOX 125 MCG tablet TAKE (1) TABLET BY MOUTH ONCE DAILY.  [name] diltiazem (CARDIZEM CD) 120 MG 24 hr capsule TAKE (1) CAPSULE DAILY IN THE EVENING.  [name] diltiazem (CARDIZEM CD) 240 MG 24 hr capsule TAKE 1 CAPSULE DAILY.  [name] docusate sodium (COLACE) 100 MG capsule Take 100 mg by mouth 2 (two) times daily.  . metoprolol (LOPRESSOR) 50 MG tablet TAKE (1) TABLET TWICE DAILY.  . pantoprazole (PROTONIX) 40 MG tablet Take 40 mg by mouth daily.  . Vitamin D, Ergocalciferol, (DRISDOL) 50000 UNITS CAPS capsule Take 50,000 Units by mouth every 7 (seven) days. Mondays  . warfarin (COUMADIN) 5 MG tablet TAKE (1) TABLET BY MOUTH ONCE DAILY.  . [DISCONTINUED] pantoprazole  (PROTONIX) 40 MG tablet TAKE ONE TABLET TWICE DAILY BEFORE A MEAL. (Patient not taking: Reported on 04/26/2015)   No facility-administered encounter medications on file as of 04/26/2015.     Objective: Blood pressure 110/70, pulse 68, temperature 97 F (36.1 C), temperature source Oral, resp. rate 18, height 5\' 4"  (1.626 m), weight 151 lb 11.2 oz (68.811 kg). Patient is alert and in no acute distress. Conjunctiva is pink. Sclera is nonicteric Oropharyngeal mucosa is normal. No neck masses or thyromegaly noted. Cardiac exam with regular rhythm normal S1 and S2. No murmur or gallop noted. Lungs are clear to auscultation. Abdomen is full. Bowel sounds normal. No bruits noted. On palpation abdomen is soft and nontender without organomegaly or masses. No LE edema or clubbing noted.   Assessment:  #1. Chronic bloating. Workup has been negative including hydrogen breath test. She has no stigmata of malabsorption and she did not respond to empiric antibiotic therapy. Constipation may be contribution to this symptom. #2. Constipation. She has failed polyethylene glycol and Amitiza. She may benefit from low-dose laxative. #3. GERD. Symptoms well controlled with therapy.   Plan:  Discontinue Colace. Peri-Colace 1 tablet by mouth twice a day. Continue pantoprazole at 40 mg by mouth every morning. Use Phazyme on as-needed basis. Call with progress report in few weeks. Office visit in one year.

## 2015-05-31 ENCOUNTER — Ambulatory Visit (INDEPENDENT_AMBULATORY_CARE_PROVIDER_SITE_OTHER): Payer: Medicare HMO | Admitting: *Deleted

## 2015-05-31 DIAGNOSIS — I4891 Unspecified atrial fibrillation: Secondary | ICD-10-CM

## 2015-05-31 DIAGNOSIS — Z5181 Encounter for therapeutic drug level monitoring: Secondary | ICD-10-CM | POA: Diagnosis not present

## 2015-05-31 LAB — POCT INR: INR: 2.7

## 2015-06-09 ENCOUNTER — Other Ambulatory Visit (INDEPENDENT_AMBULATORY_CARE_PROVIDER_SITE_OTHER): Payer: Self-pay | Admitting: Internal Medicine

## 2015-07-02 ENCOUNTER — Encounter (HOSPITAL_COMMUNITY): Payer: Self-pay | Admitting: *Deleted

## 2015-07-02 ENCOUNTER — Emergency Department (HOSPITAL_COMMUNITY): Payer: Medicare HMO

## 2015-07-02 ENCOUNTER — Emergency Department (HOSPITAL_COMMUNITY)
Admission: EM | Admit: 2015-07-02 | Discharge: 2015-07-02 | Disposition: A | Discharge status: Home / Self Care | Payer: Medicare HMO | Attending: Emergency Medicine | Admitting: Emergency Medicine

## 2015-07-02 DIAGNOSIS — Z87891 Personal history of nicotine dependence: Secondary | ICD-10-CM | POA: Diagnosis not present

## 2015-07-02 DIAGNOSIS — S63501A Unspecified sprain of right wrist, initial encounter: Secondary | ICD-10-CM | POA: Diagnosis not present

## 2015-07-02 DIAGNOSIS — Y999 Unspecified external cause status: Secondary | ICD-10-CM | POA: Insufficient documentation

## 2015-07-02 DIAGNOSIS — Z23 Encounter for immunization: Secondary | ICD-10-CM | POA: Insufficient documentation

## 2015-07-02 DIAGNOSIS — M1990 Unspecified osteoarthritis, unspecified site: Secondary | ICD-10-CM | POA: Insufficient documentation

## 2015-07-02 DIAGNOSIS — R52 Pain, unspecified: Secondary | ICD-10-CM

## 2015-07-02 DIAGNOSIS — I10 Essential (primary) hypertension: Secondary | ICD-10-CM | POA: Insufficient documentation

## 2015-07-02 DIAGNOSIS — W010XXA Fall on same level from slipping, tripping and stumbling without subsequent striking against object, initial encounter: Secondary | ICD-10-CM | POA: Insufficient documentation

## 2015-07-02 DIAGNOSIS — Y929 Unspecified place or not applicable: Secondary | ICD-10-CM | POA: Insufficient documentation

## 2015-07-02 DIAGNOSIS — S0191XA Laceration without foreign body of unspecified part of head, initial encounter: Secondary | ICD-10-CM | POA: Diagnosis not present

## 2015-07-02 DIAGNOSIS — Y939 Activity, unspecified: Secondary | ICD-10-CM | POA: Diagnosis not present

## 2015-07-02 DIAGNOSIS — S0181XA Laceration without foreign body of other part of head, initial encounter: Secondary | ICD-10-CM

## 2015-07-02 DIAGNOSIS — S0990XA Unspecified injury of head, initial encounter: Secondary | ICD-10-CM | POA: Diagnosis present

## 2015-07-02 DIAGNOSIS — W19XXXA Unspecified fall, initial encounter: Secondary | ICD-10-CM

## 2015-07-02 MED ORDER — TETANUS-DIPHTH-ACELL PERTUSSIS 5-2.5-18.5 LF-MCG/0.5 IM SUSP
0.5000 mL | Freq: Once | INTRAMUSCULAR | Status: AC
  Administered 2015-07-02: 0.5 mL via INTRAMUSCULAR
  Filled 2015-07-02: qty 0.5

## 2015-07-02 MED ORDER — LIDOCAINE-EPINEPHRINE (PF) 2 %-1:200000 IJ SOLN
10.0000 mL | Freq: Once | INTRAMUSCULAR | Status: AC
  Administered 2015-07-02: 10 mL
  Filled 2015-07-02: qty 20

## 2015-07-02 NOTE — ED Provider Notes (Signed)
CSN: [id number]     Arrival date & time 07/02/15  1545 History   First MD Initiated Contact with Patient 07/02/15 1606     Chief Complaint  Patient presents with  . Head Injury   PT TRIPPED AS SHE WAS STEPPING UP ONTO THE CURB AND FELL ON HER RIGHT FACE.  SHE SUSTAINED A LAC TO HER RIGHT EYEBROW.  THE PT HAS SOME OTHER ABRASIONS AND BRUISES, BUT THEY ARE NOT BOTHERING HER.  PT IS ON COUMADIN FOR A.FLUTTER.  (Consider location/radiation/quality/duration/timing/severity/associated sxs/prior Treatment) Patient is a 81 y.o. female presenting with head injury. The history is provided by the patient.  Head Injury Location:  Frontal Mechanism of injury: fall   Pain details:    Quality:  Aching   Radiates to:  Face   Severity:  Mild   Past Medical History  Diagnosis Date  . Seasonal allergies   . Heart valve disease     leaking heart valve  . Hypertension   . Osteopenia   . GERD (gastroesophageal reflux disease)   . Hypercholesterolemia   . Atrial flutter ([place])     a. Pt reports episode 02/2013 noted pre-op cataracts but was in NSR at time of f/u. Saw cardiology and was told she had a leaky heart valve.  . Valvular heart disease   . GI problem     a. Pt reports workup for abdominal sensitivity with unremarkable endoscopy and CT.  [name] Family history of adverse reaction to anesthesia     "some people in my family have; don't know what happened though" (03/22/2014)  . Arthritis     "in my back/gastro dr" (03/22/2014)   Past Surgical History  Procedure Laterality Date  . Cholecystectomy  2000?    [facility]  . Tonsillectomy  1943    [facility]-no longer present  . Cataract extraction w/phaco Right 06/30/2013    Procedure: CATARACT EXTRACTION PHACO AND INTRAOCULAR LENS PLACEMENT (IOC);  Surgeon: Elta Guadeloupe T. Gershon Crane, MD;  Location: [facility] OR[facility];  Service: Ophthalmology;  Laterality: Right;  CDE:7.99  . Cataract extraction w/phaco Left 07/14/2013    Procedure: CATARACT EXTRACTION PHACO AND  INTRAOCULAR LENS PLACEMENT (IOC);  Surgeon: Elta Guadeloupe T. Gershon Crane, MD;  Location: [facility];  Service: Ophthalmology;  Laterality: Left;  CDE:  8.25  . Esophagogastroduodenoscopy N/A 01/20/2014    Procedure: ESOPHAGOGASTRODUODENOSCOPY (EGD);  Surgeon: Rogene Houston, MD;  Location: [facility];  Service: Endoscopy;  Laterality: N/A;  120 - moved to 12:25 - [name] to notify pt  . Cataract extraction w/ intraocular lens  implant, bilateral Bilateral 2015  . Tonsillectomy  ~ 1943  . Laparoscopic cholecystectomy  ~ 2000  . Bacterial overgrowth test N/A 08/13/2014    Procedure: BACTERIAL OVERGROWTH TEST;  Surgeon: Rogene Houston, MD;  Location: [facility];  Service: Endoscopy;  Laterality: N/A;  730   Family History  Problem Relation Age of Onset  . Diabetes    . Asthma    . CAD Father     MI age 1  . CAD Mother     MI age 29  . CAD Brother     MI age 55  . CAD Brother     MI age 69  . Emphysema Father   . Heart disease Sister     One sister - unsure of what kind of heart issues  . Atrial fibrillation Sister    Social History  Substance Use Topics  . Smoking status: Former Smoker -- 0.75 packs/day for  15 years    Types: Cigarettes    Quit date: 03/18/1974  . Smokeless tobacco: Never Used     Comment: "quit smoking cigarettes in the 1970's"  . Alcohol Use: No   OB History    Gravida Para Term Preterm AB TAB SAB Ectopic Multiple Living   0 0 0 0 0 0 0 0       Review of Systems  Skin: Positive for wound.  All other systems reviewed and are negative.     Allergies  Chocolate; Chocolate; Peanuts; Peanuts; Penicillins; and Penicillins  Home Medications   Prior to Admission medications   Medication Sig Start Date End Date Taking? Authorizing Provider  acetaminophen (TYLENOL) 650 MG CR tablet Take 650 mg by mouth every 8 (eight) hours as needed for pain.    Historical Provider, MD  calcium carbonate (TUMS - DOSED IN MG ELEMENTAL CALCIUM) 500 MG chewable tablet Chew 1 tablet by  mouth daily as needed for indigestion or heartburn.    Historical Provider, MD  cyanocobalamin (,VITAMIN B-12,) 1000 MCG/ML injection Inject 1,000 mcg into the muscle every 14 (fourteen) days.    Historical Provider, MD  [place] 125 MCG tablet TAKE (1) TABLET BY MOUTH ONCE DAILY. 02/17/15   Herminio Commons, MD  diltiazem (CARDIZEM CD) 120 MG 24 hr capsule TAKE (1) CAPSULE DAILY IN THE EVENING. 03/11/15   Herminio Commons, MD  diltiazem (CARDIZEM CD) 240 MG 24 hr capsule TAKE 1 CAPSULE DAILY. 03/11/15   Herminio Commons, MD  metoprolol (LOPRESSOR) 50 MG tablet TAKE (1) TABLET TWICE DAILY. 03/11/15   Herminio Commons, MD  pantoprazole (PROTONIX) 40 MG tablet TAKE ONE TABLET TWICE DAILY BEFORE A MEAL. 06/10/15   Rogene Houston, MD  sennosides-docusate sodium (SENOKOT-S) 8.6-50 MG tablet Take 1 tablet by mouth 2 (two) times daily. 04/26/15   Rogene Houston, MD  Simethicone (PHAZYME) 180 MG CAPS Take 180 mg by mouth daily.    Historical Provider, MD  Vitamin D, Ergocalciferol, (DRISDOL) 50000 UNITS CAPS capsule Take 50,000 Units by mouth every 7 (seven) days. Mondays    Historical Provider, MD  warfarin (COUMADIN) 5 MG tablet TAKE (1) TABLET BY MOUTH ONCE DAILY. 04/12/15   Herminio Commons, MD  Wheat Dextrin (BENEFIBER DRINK MIX) PACK Take 4 g by mouth at bedtime. 04/26/15   Rogene Houston, MD   BP 135/77 mmHg  Pulse 61  Temp(Src) 97.9 F (36.6 C) (Oral)  Resp 16  Ht 5\' 4"  (1.626 m)  Wt 150 lb (68.04 kg)  BMI 25.73 kg/m2  SpO2 98% Physical Exam  Constitutional: She is oriented to person, place, and time. She appears well-developed and well-nourished.  HENT:  Head: Normocephalic.    Right Ear: External ear normal.  Left Ear: External ear normal.  Nose: Nose normal.  Mouth/Throat: Oropharynx is clear and moist.  Eyes: Conjunctivae and EOM are normal. Pupils are equal, round, and reactive to light.  Neck: Normal range of motion. Neck supple.  Cardiovascular: Normal rate, regular  rhythm, normal heart sounds and intact distal pulses.   Pulmonary/Chest: Effort normal and breath sounds normal.  Abdominal: Soft. Bowel sounds are normal.  Musculoskeletal: Normal range of motion.  Neurological: She is alert and oriented to person, place, and time.  Skin: Skin is warm and dry.  Psychiatric: She has a normal mood and affect. Her behavior is normal. Thought content normal.  Nursing note and vitals reviewed.   ED Course  .Marland KitchenLaceration Repair Date/Time: 07/02/2015  5:22 PM Performed by: Isla Pence Authorized by: Isla Pence Consent: Verbal consent obtained. Risks and benefits: risks, benefits and alternatives were discussed Consent given by: patient Patient understanding: patient states understanding of the procedure being performed Patient consent: the patient's understanding of the procedure matches consent given Procedure consent: procedure consent matches procedure scheduled Required items: required blood products, implants, devices, and special equipment available Patient identity confirmed: verbally with patient Time out: Immediately prior to procedure a "time out" was called to verify the correct patient, procedure, equipment, support staff and site/side marked as required. Body area: head/neck Location details: right eyebrow Laceration length: 2 cm Tendon involvement: none Nerve involvement: none Vascular damage: no Anesthesia: local infiltration Local anesthetic: lidocaine 2% with epinephrine Anesthetic total: 2 ml Patient sedated: no Preparation: Patient was prepped and draped in the usual sterile fashion. Irrigation solution: saline Irrigation method: syringe Amount of cleaning: standard Debridement: none Degree of undermining: none Skin closure: 5-0 Prolene Number of sutures: 4 Technique: simple Approximation: close Approximation difficulty: simple Dressing: 4x4 sterile gauze and antibiotic ointment Patient tolerance: Patient tolerated the  procedure well with no immediate complications   (including critical care time) Labs Review Labs Reviewed - No data to display  Imaging Review Dg Wrist Complete Right  07/02/2015  CLINICAL DATA:  Recent fall with right wrist pain, initial encounter EXAM: RIGHT WRIST - COMPLETE 3+ VIEW COMPARISON:  None. FINDINGS: There is no evidence of fracture or dislocation. There is no evidence of arthropathy or other focal bone abnormality. Soft tissues are unremarkable. IMPRESSION: No acute abnormality noted. Electronically Signed   By: Inez Catalina M.D.   On: 07/02/2015 17:59   Ct Head Wo Contrast  07/02/2015  CLINICAL DATA:  Status post fall. Laceration to the right eyebrow. Denies loss of consciousness. EXAM: CT HEAD WITHOUT CONTRAST CT MAXILLOFACIAL WITHOUT CONTRAST TECHNIQUE: Multidetector CT imaging of the head and maxillofacial structures were performed using the standard protocol without intravenous contrast. Multiplanar CT image reconstructions of the maxillofacial structures were also generated. COMPARISON:  None. FINDINGS: CT HEAD FINDINGS There is no evidence of mass effect, midline shift, or extra-axial fluid collections. There is no evidence of a space-occupying lesion or intracranial hemorrhage. There is no evidence of a cortical-based area of acute infarction. There is generalized cerebral atrophy. There is periventricular white matter low attenuation likely secondary to microangiopathy. The ventricles and sulci are appropriate for the patient's age. The basal cisterns are patent. Visualized portions of the orbits are unremarkable. The visualized portions of the paranasal sinuses and mastoid air cells are unremarkable. Cerebrovascular atherosclerotic calcifications are noted. The osseous structures are unremarkable. CT MAXILLOFACIAL FINDINGS The globes are intact. The orbital walls are intact. The orbital floors are intact. The maxilla is intact. The mandible is intact. The zygomatic arches are  intact. The nasal septum is midline. There is no nasal bone fracture. There are mild degenerative changes of bilateral temporomandibular joints. There is mild right maxillary sinus mucosal thickening. The remainder the paranasal sinuses are clear. The visualized portions of the mastoid sinuses are well aerated. There is bilateral carotid artery atherosclerosis. IMPRESSION: 1. No acute intracranial pathology. 2. No acute osseous injury of the maxillofacial bones. Electronically Signed   By: Kathreen Devoid   On: 07/02/2015 17:21   Ct Maxillofacial Wo Cm  07/02/2015  CLINICAL DATA:  Status post fall. Laceration to the right eyebrow. Denies loss of consciousness. EXAM: CT HEAD WITHOUT CONTRAST CT MAXILLOFACIAL WITHOUT CONTRAST TECHNIQUE: Multidetector CT imaging of the head and  maxillofacial structures were performed using the standard protocol without intravenous contrast. Multiplanar CT image reconstructions of the maxillofacial structures were also generated. COMPARISON:  None. FINDINGS: CT HEAD FINDINGS There is no evidence of mass effect, midline shift, or extra-axial fluid collections. There is no evidence of a space-occupying lesion or intracranial hemorrhage. There is no evidence of a cortical-based area of acute infarction. There is generalized cerebral atrophy. There is periventricular white matter low attenuation likely secondary to microangiopathy. The ventricles and sulci are appropriate for the patient's age. The basal cisterns are patent. Visualized portions of the orbits are unremarkable. The visualized portions of the paranasal sinuses and mastoid air cells are unremarkable. Cerebrovascular atherosclerotic calcifications are noted. The osseous structures are unremarkable. CT MAXILLOFACIAL FINDINGS The globes are intact. The orbital walls are intact. The orbital floors are intact. The maxilla is intact. The mandible is intact. The zygomatic arches are intact. The nasal septum is midline. There is no  nasal bone fracture. There are mild degenerative changes of bilateral temporomandibular joints. There is mild right maxillary sinus mucosal thickening. The remainder the paranasal sinuses are clear. The visualized portions of the mastoid sinuses are well aerated. There is bilateral carotid artery atherosclerosis. IMPRESSION: 1. No acute intracranial pathology. 2. No acute osseous injury of the maxillofacial bones. Electronically Signed   By: Kathreen Devoid   On: 07/02/2015 17:21   I have personally reviewed and evaluated these images and lab results as part of my medical decision-making.   EKG Interpretation None      MDM   Final diagnoses:  Pain  Facial laceration, initial encounter  Accidental fall  Right wrist sprain, initial encounter       Isla Pence, MD 07/02/15 1805

## 2015-07-02 NOTE — ED Notes (Signed)
Pt states she was stepping up onto the curb and tripped and fell. Pt has laceration to her right eye brown. Pt denies any loss of consciousness. Pt is on warfarin. NAD noted. Pt is alert and oriented. Denies any dizziness.

## 2015-07-12 ENCOUNTER — Ambulatory Visit (INDEPENDENT_AMBULATORY_CARE_PROVIDER_SITE_OTHER): Payer: Medicare HMO | Admitting: *Deleted

## 2015-07-12 DIAGNOSIS — Z5181 Encounter for therapeutic drug level monitoring: Secondary | ICD-10-CM

## 2015-07-12 DIAGNOSIS — I4891 Unspecified atrial fibrillation: Secondary | ICD-10-CM

## 2015-07-12 LAB — POCT INR: INR: 3.6

## 2015-08-02 ENCOUNTER — Ambulatory Visit (INDEPENDENT_AMBULATORY_CARE_PROVIDER_SITE_OTHER): Payer: Medicare HMO | Admitting: *Deleted

## 2015-08-02 DIAGNOSIS — Z5181 Encounter for therapeutic drug level monitoring: Secondary | ICD-10-CM

## 2015-08-02 DIAGNOSIS — I4891 Unspecified atrial fibrillation: Secondary | ICD-10-CM

## 2015-08-02 LAB — POCT INR: INR: 2.3

## 2015-08-11 ENCOUNTER — Other Ambulatory Visit: Payer: Self-pay | Admitting: Cardiovascular Disease

## 2015-09-06 ENCOUNTER — Ambulatory Visit (INDEPENDENT_AMBULATORY_CARE_PROVIDER_SITE_OTHER): Payer: Medicare HMO | Admitting: *Deleted

## 2015-09-06 DIAGNOSIS — I4891 Unspecified atrial fibrillation: Secondary | ICD-10-CM

## 2015-09-06 DIAGNOSIS — Z5181 Encounter for therapeutic drug level monitoring: Secondary | ICD-10-CM

## 2015-09-06 LAB — POCT INR: INR: 2.2

## 2015-10-04 ENCOUNTER — Ambulatory Visit (INDEPENDENT_AMBULATORY_CARE_PROVIDER_SITE_OTHER): Payer: Medicare HMO | Admitting: *Deleted

## 2015-10-04 DIAGNOSIS — I4891 Unspecified atrial fibrillation: Secondary | ICD-10-CM | POA: Diagnosis not present

## 2015-10-04 DIAGNOSIS — Z5181 Encounter for therapeutic drug level monitoring: Secondary | ICD-10-CM

## 2015-10-04 LAB — POCT INR: INR: 3.4

## 2015-10-12 ENCOUNTER — Other Ambulatory Visit: Payer: Self-pay | Admitting: Cardiovascular Disease

## 2015-10-25 ENCOUNTER — Ambulatory Visit (INDEPENDENT_AMBULATORY_CARE_PROVIDER_SITE_OTHER): Payer: Medicare HMO | Admitting: *Deleted

## 2015-10-25 DIAGNOSIS — I4891 Unspecified atrial fibrillation: Secondary | ICD-10-CM | POA: Diagnosis not present

## 2015-10-25 DIAGNOSIS — Z5181 Encounter for therapeutic drug level monitoring: Secondary | ICD-10-CM | POA: Diagnosis not present

## 2015-10-25 LAB — POCT INR: INR: 3.5

## 2015-11-08 ENCOUNTER — Other Ambulatory Visit: Payer: Self-pay | Admitting: Cardiovascular Disease

## 2015-11-15 ENCOUNTER — Ambulatory Visit (INDEPENDENT_AMBULATORY_CARE_PROVIDER_SITE_OTHER): Payer: Medicare HMO | Admitting: *Deleted

## 2015-11-15 DIAGNOSIS — Z5181 Encounter for therapeutic drug level monitoring: Secondary | ICD-10-CM

## 2015-11-15 DIAGNOSIS — I4891 Unspecified atrial fibrillation: Secondary | ICD-10-CM | POA: Diagnosis not present

## 2015-11-15 LAB — POCT INR: INR: 2.4

## 2015-12-06 ENCOUNTER — Other Ambulatory Visit: Payer: Self-pay | Admitting: Cardiovascular Disease

## 2015-12-13 ENCOUNTER — Ambulatory Visit (INDEPENDENT_AMBULATORY_CARE_PROVIDER_SITE_OTHER): Payer: Medicare HMO | Admitting: *Deleted

## 2015-12-13 DIAGNOSIS — I4891 Unspecified atrial fibrillation: Secondary | ICD-10-CM

## 2015-12-13 DIAGNOSIS — Z5181 Encounter for therapeutic drug level monitoring: Secondary | ICD-10-CM | POA: Diagnosis not present

## 2015-12-13 LAB — POCT INR: INR: 2

## 2015-12-14 ENCOUNTER — Other Ambulatory Visit: Payer: Self-pay | Admitting: Cardiovascular Disease

## 2015-12-14 MED ORDER — DILTIAZEM HCL ER BEADS 120 MG PO CP24: 120.0000 mg | ORAL_CAPSULE | Freq: Every evening | ORAL | 6 refills | Status: DC

## 2015-12-14 MED ORDER — DILTIAZEM HCL ER COATED BEADS 240 MG PO CP24: 240.0000 mg | ORAL_CAPSULE | ORAL | 6 refills | Status: DC

## 2015-12-14 NOTE — Telephone Encounter (Signed)
Done

## 2015-12-14 NOTE — Telephone Encounter (Signed)
REFILL:  Lori David was due in July to see Dr. Bronson Ing but states she did not receive her reminder.  She is getting ready to go out of town and needs refills updated for Diltiazem 120 & 240 mg. Patient has been scheduled with first available in December 2017 with Dr. Bronson Ing . She only has 15 pills so will need enough medication to last her until her upcoming visit.

## 2016-01-09 ENCOUNTER — Other Ambulatory Visit: Payer: Self-pay | Admitting: Cardiovascular Disease

## 2016-01-17 ENCOUNTER — Ambulatory Visit (INDEPENDENT_AMBULATORY_CARE_PROVIDER_SITE_OTHER): Payer: Medicare HMO | Admitting: *Deleted

## 2016-01-17 DIAGNOSIS — Z5181 Encounter for therapeutic drug level monitoring: Secondary | ICD-10-CM

## 2016-01-17 DIAGNOSIS — I4891 Unspecified atrial fibrillation: Secondary | ICD-10-CM

## 2016-01-17 LAB — POCT INR: INR: 2.7

## 2016-01-23 ENCOUNTER — Encounter: Payer: Self-pay | Admitting: *Deleted

## 2016-01-24 ENCOUNTER — Encounter: Payer: Self-pay | Admitting: Cardiovascular Disease

## 2016-01-24 ENCOUNTER — Ambulatory Visit (INDEPENDENT_AMBULATORY_CARE_PROVIDER_SITE_OTHER): Payer: Medicare HMO | Admitting: Cardiovascular Disease

## 2016-01-24 VITALS — BP 112/64 | HR 63 | Ht 64.0 in | Wt 151.0 lb

## 2016-01-24 DIAGNOSIS — R0609 Other forms of dyspnea: Secondary | ICD-10-CM | POA: Diagnosis not present

## 2016-01-24 DIAGNOSIS — I10 Essential (primary) hypertension: Secondary | ICD-10-CM | POA: Diagnosis not present

## 2016-01-24 DIAGNOSIS — I482 Chronic atrial fibrillation, unspecified: Secondary | ICD-10-CM

## 2016-01-24 DIAGNOSIS — I340 Nonrheumatic mitral (valve) insufficiency: Secondary | ICD-10-CM | POA: Diagnosis not present

## 2016-01-24 DIAGNOSIS — I429 Cardiomyopathy, unspecified: Secondary | ICD-10-CM

## 2016-01-24 NOTE — Patient Instructions (Addendum)

## 2016-01-24 NOTE — Progress Notes (Signed)
SUBJECTIVE:  The patient presents for follow-up of atrial fibrillation and cardiomyopathy (EF 40-45% on 03/23/14).  She denies chest pain and palpitations.   She does get short of breath with exertion particular when climbing stairs but this is inconsistent. She denies orthopnea, leg swelling, and paroxysmal nocturnal dyspnea.  ECG today shows rate-controlled atrial fibrillation, diffuse nonspecific ST segment abnormalities, heart rate 63 bpm.  Soc: Her brother, Amarae Billman, is also my patient.   Review of Systems: As per "subjective", otherwise negative.  Allergies  Allergen Reactions  . Chocolate     Numb feeling around mouth   . Chocolate   . Peanuts [Peanut Oil] Itching  . Peanuts [Peanut Oil]     hives  . Penicillins     Unknown   . Penicillins     unknown    Current Outpatient Prescriptions  Medication Sig Dispense Refill  . acetaminophen (TYLENOL) 650 MG CR tablet Take 650 mg by mouth every 8 (eight) hours as needed for pain.    . calcium carbonate (TUMS - DOSED IN MG ELEMENTAL CALCIUM) 500 MG chewable tablet Chew 1 tablet by mouth daily as needed for indigestion or heartburn.    . cyanocobalamin (,VITAMIN B-12,) 1000 MCG/ML injection Inject 1,000 mcg into the muscle every 14 (fourteen) days.    [name] DIGOX 125 MCG tablet TAKE (1) TABLET BY MOUTH ONCE DAILY. 30 tablet 0  . diltiazem (CARDIZEM CD) 120 MG 24 hr capsule TAKE (1) CAPSULE DAILY IN THE EVENING. 30 capsule 6  . diltiazem (CARDIZEM CD) 240 MG 24 hr capsule Take 1 capsule (240 mg total) by mouth every morning. 30 capsule 6  . metoprolol (LOPRESSOR) 50 MG tablet TAKE (1) TABLET TWICE DAILY. 60 tablet 0  . pantoprazole (PROTONIX) 40 MG tablet TAKE ONE TABLET TWICE DAILY BEFORE A MEAL. 60 tablet 5  . sennosides-docusate sodium (SENOKOT-S) 8.6-50 MG tablet Take 1 tablet by mouth 2 (two) times daily.    . Simethicone (PHAZYME) 180 MG CAPS Take 180 mg by mouth daily.    . Vitamin D, Ergocalciferol, (DRISDOL)  50000 UNITS CAPS capsule Take 50,000 Units by mouth every 7 (seven) days. Mondays    . warfarin (COUMADIN) 5 MG tablet TAKE (1) TABLET BY MOUTH ONCE DAILY. 30 tablet 0  . Wheat Dextrin (BENEFIBER DRINK MIX) PACK Take 4 g by mouth at bedtime.     No current facility-administered medications for this visit.     Past Medical History:  Diagnosis Date  . Arthritis    "in my back/gastro dr" (03/22/2014)  . Atrial flutter ([place])    a. Pt reports episode 02/2013 noted pre-op cataracts but was in NSR at time of f/u. Saw cardiology and was told she had a leaky heart valve.  . Family history of adverse reaction to anesthesia    "some people in my family have; don't know what happened though" (03/22/2014)  . GERD (gastroesophageal reflux disease)   . GI problem    a. Pt reports workup for abdominal sensitivity with unremarkable endoscopy and CT.  [name] Heart valve disease    leaking heart valve  . Hypercholesterolemia   . Hypertension   . Osteopenia   . Seasonal allergies   . Valvular heart disease     Past Surgical History:  Procedure Laterality Date  . BACTERIAL OVERGROWTH TEST N/A 08/13/2014   Procedure: BACTERIAL OVERGROWTH TEST;  Surgeon: Rogene Houston, MD;  [facility];  Service: Endoscopy;  Laterality: N/A;  730  . CATARACT EXTRACTION W/ INTRAOCULAR LENS  IMPLANT, BILATERAL Bilateral 2015  . CATARACT EXTRACTION W/PHACO Right 06/30/2013   Procedure: CATARACT EXTRACTION PHACO AND INTRAOCULAR LENS PLACEMENT (IOC);  Surgeon: Elta Guadeloupe T. Gershon Crane, MD;  Location: [facility];  Service: Ophthalmology;  Laterality: Right;  CDE:7.99  . CATARACT EXTRACTION W/PHACO Left 07/14/2013   Procedure: CATARACT EXTRACTION PHACO AND INTRAOCULAR LENS PLACEMENT (IOC);  Surgeon: Elta Guadeloupe T. Gershon Crane, MD;  Location: [facility];  Service: Ophthalmology;  Laterality: Left;  CDE:  8.25  . CHOLECYSTECTOMY  2000?   [facility]  . ESOPHAGOGASTRODUODENOSCOPY N/A 01/20/2014   Procedure: ESOPHAGOGASTRODUODENOSCOPY (EGD);  Surgeon:  Rogene Houston, MD;  [facility];  Service: Endoscopy;  Laterality: N/A;  120 - moved to 12:25 - [name] to notify pt  . LAPAROSCOPIC CHOLECYSTECTOMY  ~ 2000  . TONSILLECTOMY  1943   [facility]-no longer present  . TONSILLECTOMY  ~ 69    Social History   Social History  . Marital status: Single    Spouse name: N/A  . Number of children: N/A  . Years of education: N/A   Occupational History  . Not on file.   Social History Main Topics  . Smoking status: Former Smoker    Packs/day: 0.75    Years: 15.00    Types: Cigarettes    Quit date: 03/18/1974  . Smokeless tobacco: Never Used     Comment: "quit smoking cigarettes in the 1970's"  . Alcohol use No  . Drug use: No  . Sexual activity: No   Other Topics Concern  . Not on file   Social History Narrative   ** Merged History Encounter **         Vitals:   01/24/16 1541  BP: 112/64  Pulse: 63  SpO2: 99%  Weight: 151 lb (68.5 kg)  Height: 5\' 4"  (1.626 m)    PHYSICAL EXAM General: NAD HEENT: Normal. Neck: No JVD, no thyromegaly. Lungs: Clear to auscultation bilaterally with normal respiratory effort. CV: Nondisplaced PMI. Regular rate, irregular rhythm, normal S1/S2, no S3, no murmur. No pretibial or periankle edema.No carotid bruits. Abdomen: Soft, nontender. Psych: Normal affect. Skin: Normal. Musculoskeletal: No gross deformities. Extremities: No clubbing or cyanosis.      ECG: Most recent ECG reviewed.      ASSESSMENT AND PLAN: 1. Atrial fibrillation: Symptomatically stable on diltiazem, metoprolol, and digoxin. Continue warfarin and follow up in anticoagulation clinic.   2. Cardiomyopathy (nonischemic), EF 40-45%: No signs of heart failure.  Will repeat echocardiogram to assess for interval improvement. Continue digoxin, metoprolol, and diltiazem.  3. Moderate mitral regurgitation: Stable. Will obtain echo.  4. Essential HTN: Well controlled on current therapy. No  changes.  5. Exertional dyspnea: Will obtain echo to assess LVEF and mitral regurgitation.  Dispo: f/u 1 yr   Kate Sable, M.D., F.A.C.C.

## 2016-01-24 NOTE — Addendum Note (Signed)
Addended by: Laurine Blazer on: 01/24/2016 04:04 PM   Modules accepted: Orders

## 2016-02-10 ENCOUNTER — Other Ambulatory Visit: Payer: Self-pay | Admitting: Cardiovascular Disease

## 2016-02-16 ENCOUNTER — Other Ambulatory Visit: Payer: Self-pay

## 2016-02-16 ENCOUNTER — Ambulatory Visit (INDEPENDENT_AMBULATORY_CARE_PROVIDER_SITE_OTHER): Payer: Medicare HMO

## 2016-02-16 DIAGNOSIS — R0609 Other forms of dyspnea: Secondary | ICD-10-CM

## 2016-02-21 ENCOUNTER — Telehealth: Payer: Self-pay | Admitting: *Deleted

## 2016-02-21 NOTE — Telephone Encounter (Signed)
Notes Recorded by Laurine Blazer, LPN on [id number] at 579FGE AM EST Patient notified and verbalized understanding. Copy to pmd. ------  Notes Recorded by Herminio Commons, MD on 02/17/2016 at 8:23 AM EST Pumping function has normalized.

## 2016-02-28 ENCOUNTER — Ambulatory Visit (INDEPENDENT_AMBULATORY_CARE_PROVIDER_SITE_OTHER): Payer: Medicare HMO | Admitting: *Deleted

## 2016-02-28 DIAGNOSIS — I4891 Unspecified atrial fibrillation: Secondary | ICD-10-CM | POA: Diagnosis not present

## 2016-02-28 DIAGNOSIS — Z5181 Encounter for therapeutic drug level monitoring: Secondary | ICD-10-CM

## 2016-02-28 LAB — POCT INR: INR: 3.2

## 2016-02-29 ENCOUNTER — Encounter (INDEPENDENT_AMBULATORY_CARE_PROVIDER_SITE_OTHER): Payer: Self-pay | Admitting: Internal Medicine

## 2016-03-27 ENCOUNTER — Ambulatory Visit (INDEPENDENT_AMBULATORY_CARE_PROVIDER_SITE_OTHER): Payer: Medicare HMO | Admitting: *Deleted

## 2016-03-27 DIAGNOSIS — Z5181 Encounter for therapeutic drug level monitoring: Secondary | ICD-10-CM | POA: Diagnosis not present

## 2016-03-27 DIAGNOSIS — I4891 Unspecified atrial fibrillation: Secondary | ICD-10-CM

## 2016-03-27 LAB — POCT INR: INR: 2.1

## 2016-04-19 ENCOUNTER — Other Ambulatory Visit (INDEPENDENT_AMBULATORY_CARE_PROVIDER_SITE_OTHER): Payer: Self-pay | Admitting: Internal Medicine

## 2016-04-24 ENCOUNTER — Ambulatory Visit (INDEPENDENT_AMBULATORY_CARE_PROVIDER_SITE_OTHER): Payer: Medicare HMO | Admitting: *Deleted

## 2016-04-24 DIAGNOSIS — Z5181 Encounter for therapeutic drug level monitoring: Secondary | ICD-10-CM

## 2016-04-24 DIAGNOSIS — I4891 Unspecified atrial fibrillation: Secondary | ICD-10-CM | POA: Diagnosis not present

## 2016-04-24 LAB — POCT INR: INR: 2.2

## 2016-05-22 DIAGNOSIS — Z87891 Personal history of nicotine dependence: Secondary | ICD-10-CM | POA: Diagnosis not present

## 2016-05-22 DIAGNOSIS — Z6827 Body mass index (BMI) 27.0-27.9, adult: Secondary | ICD-10-CM | POA: Diagnosis not present

## 2016-05-22 DIAGNOSIS — I471 Supraventricular tachycardia: Secondary | ICD-10-CM | POA: Diagnosis not present

## 2016-05-22 DIAGNOSIS — I10 Essential (primary) hypertension: Secondary | ICD-10-CM | POA: Diagnosis not present

## 2016-05-22 DIAGNOSIS — Z299 Encounter for prophylactic measures, unspecified: Secondary | ICD-10-CM | POA: Diagnosis not present

## 2016-05-22 DIAGNOSIS — Z2821 Immunization not carried out because of patient refusal: Secondary | ICD-10-CM | POA: Diagnosis not present

## 2016-05-22 DIAGNOSIS — J320 Chronic maxillary sinusitis: Secondary | ICD-10-CM | POA: Diagnosis not present

## 2016-05-23 ENCOUNTER — Telehealth: Payer: Self-pay | Admitting: Cardiovascular Disease

## 2016-05-23 NOTE — Telephone Encounter (Signed)
Pt had a steroid shot yesterday for sinus infection yesterday.  Was also started on Z pack yesterday.  Also using a steroid nose spray.  Told pt to take 2.5mg  daily thru Saturday then resume 2.5mg  daily except 5mg  on M,W,F.  Has INR appt for 4/17.

## 2016-05-23 NOTE — Telephone Encounter (Signed)
Patient has been given shoot of steroids and antibiotics to start taking

## 2016-06-05 ENCOUNTER — Ambulatory Visit (INDEPENDENT_AMBULATORY_CARE_PROVIDER_SITE_OTHER): Payer: Medicare HMO | Admitting: *Deleted

## 2016-06-05 DIAGNOSIS — Z5181 Encounter for therapeutic drug level monitoring: Secondary | ICD-10-CM

## 2016-06-05 DIAGNOSIS — I4891 Unspecified atrial fibrillation: Secondary | ICD-10-CM

## 2016-06-05 LAB — POCT INR: INR: 3.9

## 2016-06-19 ENCOUNTER — Ambulatory Visit (INDEPENDENT_AMBULATORY_CARE_PROVIDER_SITE_OTHER): Payer: Self-pay | Admitting: Internal Medicine

## 2016-07-03 ENCOUNTER — Ambulatory Visit (INDEPENDENT_AMBULATORY_CARE_PROVIDER_SITE_OTHER): Payer: Medicare HMO | Admitting: *Deleted

## 2016-07-03 DIAGNOSIS — I4891 Unspecified atrial fibrillation: Secondary | ICD-10-CM | POA: Diagnosis not present

## 2016-07-03 DIAGNOSIS — Z5181 Encounter for therapeutic drug level monitoring: Secondary | ICD-10-CM | POA: Diagnosis not present

## 2016-07-03 LAB — POCT INR: INR: 2.6

## 2016-07-20 ENCOUNTER — Other Ambulatory Visit: Payer: Self-pay | Admitting: Cardiovascular Disease

## 2016-07-24 ENCOUNTER — Other Ambulatory Visit: Payer: Self-pay | Admitting: Cardiovascular Disease

## 2016-08-02 DIAGNOSIS — Z6827 Body mass index (BMI) 27.0-27.9, adult: Secondary | ICD-10-CM | POA: Diagnosis not present

## 2016-08-02 DIAGNOSIS — Z299 Encounter for prophylactic measures, unspecified: Secondary | ICD-10-CM | POA: Diagnosis not present

## 2016-08-02 DIAGNOSIS — I471 Supraventricular tachycardia: Secondary | ICD-10-CM | POA: Diagnosis not present

## 2016-08-02 DIAGNOSIS — M791 Myalgia: Secondary | ICD-10-CM | POA: Diagnosis not present

## 2016-08-02 DIAGNOSIS — I4892 Unspecified atrial flutter: Secondary | ICD-10-CM | POA: Diagnosis not present

## 2016-08-02 DIAGNOSIS — R5383 Other fatigue: Secondary | ICD-10-CM | POA: Diagnosis not present

## 2016-08-07 ENCOUNTER — Ambulatory Visit (INDEPENDENT_AMBULATORY_CARE_PROVIDER_SITE_OTHER): Payer: Medicare HMO | Admitting: *Deleted

## 2016-08-07 DIAGNOSIS — Z5181 Encounter for therapeutic drug level monitoring: Secondary | ICD-10-CM

## 2016-08-07 DIAGNOSIS — I4891 Unspecified atrial fibrillation: Secondary | ICD-10-CM | POA: Diagnosis not present

## 2016-08-07 LAB — POCT INR: INR: 2.3

## 2016-08-15 DIAGNOSIS — Z1211 Encounter for screening for malignant neoplasm of colon: Secondary | ICD-10-CM | POA: Diagnosis not present

## 2016-08-15 DIAGNOSIS — Z1231 Encounter for screening mammogram for malignant neoplasm of breast: Secondary | ICD-10-CM | POA: Diagnosis not present

## 2016-08-15 DIAGNOSIS — Z299 Encounter for prophylactic measures, unspecified: Secondary | ICD-10-CM | POA: Diagnosis not present

## 2016-08-15 DIAGNOSIS — Z0000 Encounter for general adult medical examination without abnormal findings: Secondary | ICD-10-CM | POA: Diagnosis not present

## 2016-08-15 DIAGNOSIS — I471 Supraventricular tachycardia: Secondary | ICD-10-CM | POA: Diagnosis not present

## 2016-08-15 DIAGNOSIS — I4892 Unspecified atrial flutter: Secondary | ICD-10-CM | POA: Diagnosis not present

## 2016-08-15 DIAGNOSIS — Z79899 Other long term (current) drug therapy: Secondary | ICD-10-CM | POA: Diagnosis not present

## 2016-08-15 DIAGNOSIS — R739 Hyperglycemia, unspecified: Secondary | ICD-10-CM | POA: Diagnosis not present

## 2016-08-15 DIAGNOSIS — E663 Overweight: Secondary | ICD-10-CM | POA: Diagnosis not present

## 2016-08-15 DIAGNOSIS — I10 Essential (primary) hypertension: Secondary | ICD-10-CM | POA: Diagnosis not present

## 2016-08-15 DIAGNOSIS — Z7189 Other specified counseling: Secondary | ICD-10-CM | POA: Diagnosis not present

## 2016-08-15 DIAGNOSIS — Z1389 Encounter for screening for other disorder: Secondary | ICD-10-CM | POA: Diagnosis not present

## 2016-08-15 DIAGNOSIS — R5383 Other fatigue: Secondary | ICD-10-CM | POA: Diagnosis not present

## 2016-09-03 ENCOUNTER — Other Ambulatory Visit: Payer: Self-pay | Admitting: Cardiovascular Disease

## 2016-09-18 ENCOUNTER — Ambulatory Visit (INDEPENDENT_AMBULATORY_CARE_PROVIDER_SITE_OTHER): Payer: Medicare HMO | Admitting: *Deleted

## 2016-09-18 DIAGNOSIS — Z5181 Encounter for therapeutic drug level monitoring: Secondary | ICD-10-CM

## 2016-09-18 DIAGNOSIS — I4891 Unspecified atrial fibrillation: Secondary | ICD-10-CM | POA: Diagnosis not present

## 2016-09-18 DIAGNOSIS — E119 Type 2 diabetes mellitus without complications: Secondary | ICD-10-CM | POA: Diagnosis not present

## 2016-09-18 LAB — POCT INR: INR: 2

## 2016-09-27 DIAGNOSIS — I4892 Unspecified atrial flutter: Secondary | ICD-10-CM | POA: Diagnosis not present

## 2016-09-27 DIAGNOSIS — I10 Essential (primary) hypertension: Secondary | ICD-10-CM | POA: Diagnosis not present

## 2016-09-27 DIAGNOSIS — G4700 Insomnia, unspecified: Secondary | ICD-10-CM | POA: Diagnosis not present

## 2016-09-27 DIAGNOSIS — E1165 Type 2 diabetes mellitus with hyperglycemia: Secondary | ICD-10-CM | POA: Diagnosis not present

## 2016-09-27 DIAGNOSIS — Z299 Encounter for prophylactic measures, unspecified: Secondary | ICD-10-CM | POA: Diagnosis not present

## 2016-09-27 DIAGNOSIS — Z6827 Body mass index (BMI) 27.0-27.9, adult: Secondary | ICD-10-CM | POA: Diagnosis not present

## 2016-09-27 DIAGNOSIS — Z713 Dietary counseling and surveillance: Secondary | ICD-10-CM | POA: Diagnosis not present

## 2016-09-27 DIAGNOSIS — I471 Supraventricular tachycardia: Secondary | ICD-10-CM | POA: Diagnosis not present

## 2016-10-01 DIAGNOSIS — E119 Type 2 diabetes mellitus without complications: Secondary | ICD-10-CM | POA: Diagnosis not present

## 2016-10-01 DIAGNOSIS — R69 Illness, unspecified: Secondary | ICD-10-CM | POA: Diagnosis not present

## 2016-10-02 DIAGNOSIS — R69 Illness, unspecified: Secondary | ICD-10-CM | POA: Diagnosis not present

## 2016-10-08 ENCOUNTER — Other Ambulatory Visit: Payer: Self-pay

## 2016-10-08 DIAGNOSIS — E119 Type 2 diabetes mellitus without complications: Secondary | ICD-10-CM | POA: Diagnosis not present

## 2016-10-08 MED ORDER — DIGOXIN 125 MCG PO TABS: ORAL_TABLET | ORAL | 6 refills | Status: DC

## 2016-10-08 MED ORDER — METOPROLOL TARTRATE 50 MG PO TABS: ORAL_TABLET | ORAL | 6 refills | Status: DC

## 2016-10-15 DIAGNOSIS — E119 Type 2 diabetes mellitus without complications: Secondary | ICD-10-CM | POA: Diagnosis not present

## 2016-10-30 ENCOUNTER — Ambulatory Visit (INDEPENDENT_AMBULATORY_CARE_PROVIDER_SITE_OTHER): Payer: Medicare HMO | Admitting: *Deleted

## 2016-10-30 DIAGNOSIS — I4891 Unspecified atrial fibrillation: Secondary | ICD-10-CM | POA: Diagnosis not present

## 2016-10-30 DIAGNOSIS — Z5181 Encounter for therapeutic drug level monitoring: Secondary | ICD-10-CM

## 2016-10-30 LAB — POCT INR: INR: 2

## 2016-11-23 ENCOUNTER — Other Ambulatory Visit: Payer: Self-pay

## 2016-11-23 ENCOUNTER — Other Ambulatory Visit: Payer: Self-pay | Admitting: Cardiovascular Disease

## 2016-11-23 MED ORDER — DILTIAZEM HCL ER BEADS 240 MG PO CP24: 240.0000 mg | ORAL_CAPSULE | Freq: Every morning | ORAL | 3 refills | Status: DC

## 2016-11-23 MED ORDER — DILTIAZEM HCL ER COATED BEADS 120 MG PO CP24: ORAL_CAPSULE | ORAL | 3 refills | Status: DC

## 2016-11-26 DIAGNOSIS — R69 Illness, unspecified: Secondary | ICD-10-CM | POA: Diagnosis not present

## 2016-11-29 DIAGNOSIS — J329 Chronic sinusitis, unspecified: Secondary | ICD-10-CM | POA: Diagnosis not present

## 2016-11-29 DIAGNOSIS — I471 Supraventricular tachycardia: Secondary | ICD-10-CM | POA: Diagnosis not present

## 2016-11-29 DIAGNOSIS — E663 Overweight: Secondary | ICD-10-CM | POA: Diagnosis not present

## 2016-11-29 DIAGNOSIS — I4892 Unspecified atrial flutter: Secondary | ICD-10-CM | POA: Diagnosis not present

## 2016-11-29 DIAGNOSIS — Z299 Encounter for prophylactic measures, unspecified: Secondary | ICD-10-CM | POA: Diagnosis not present

## 2016-11-29 DIAGNOSIS — E1165 Type 2 diabetes mellitus with hyperglycemia: Secondary | ICD-10-CM | POA: Diagnosis not present

## 2016-11-29 DIAGNOSIS — I10 Essential (primary) hypertension: Secondary | ICD-10-CM | POA: Diagnosis not present

## 2016-12-09 IMAGING — CT CT ABD-PELV W/ CM
2 of 5 series · 16 of 46 positions shown, 18 images · IV contrast (Omnipaque 300)
Comparison: None.

CLINICAL DATA: Abdominal pain, GERD, weight loss. Prior
cholecystectomy.

EXAM:
CT ABDOMEN AND PELVIS WITH CONTRAST
TECHNIQUE: Multidetector CT imaging of the abdomen and pelvis was performed
using the standard protocol following bolus administration of
intravenous contrast.
CONTRAST:  100mL OMNIPAQUE IOHEXOL 300 MG/ML  SOLN

[Series 2: abd_pel_with 5.0 b40f · axial · 0.72mm/px · z∈[-493,-83]mm · 13 of 94 slices shown, 15 images]
[im 6/94  soft-tissue]
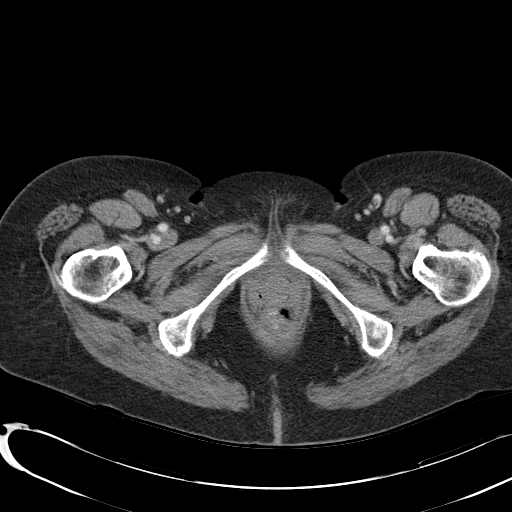
[im 6/94  bone]
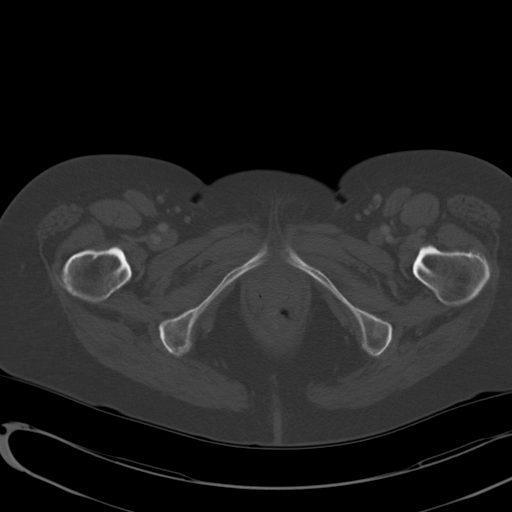
[im 11/94  soft-tissue]
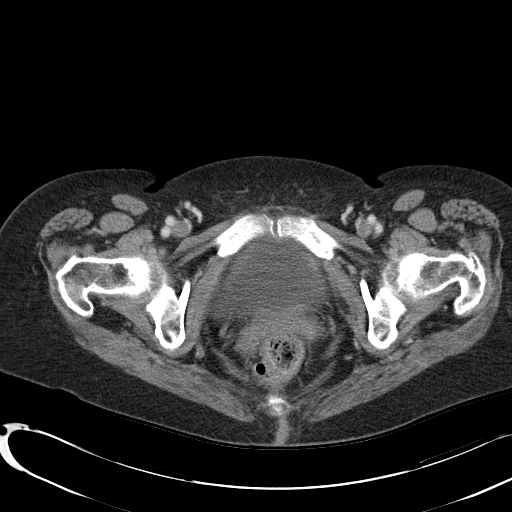
[im 21/94  soft-tissue]
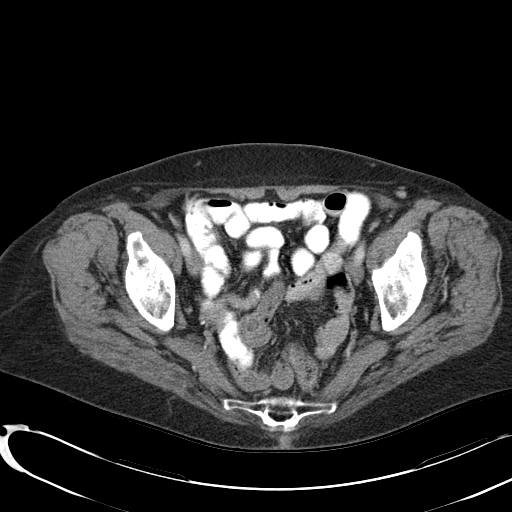
[im 26/94  soft-tissue]
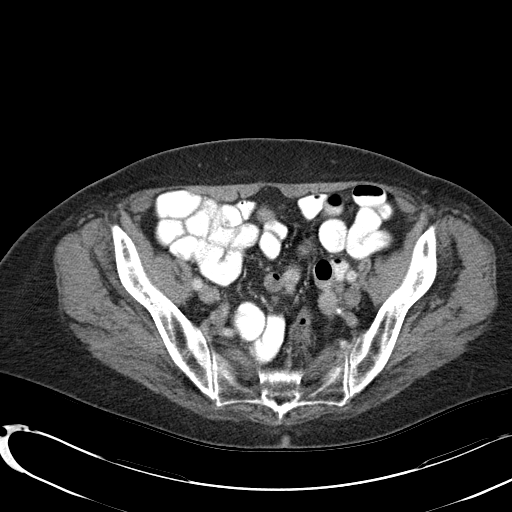
[im 32/94  soft-tissue]
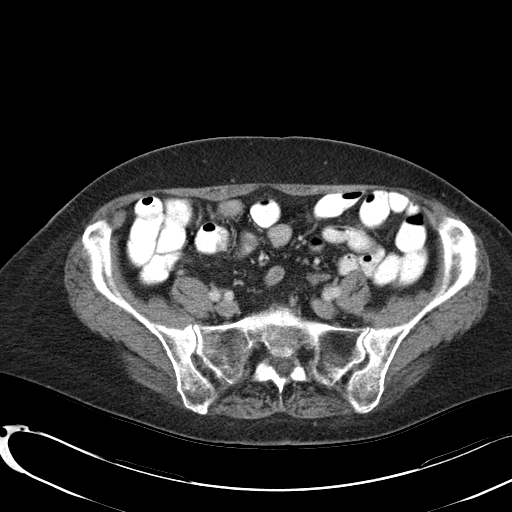
[im 42/94  soft-tissue]
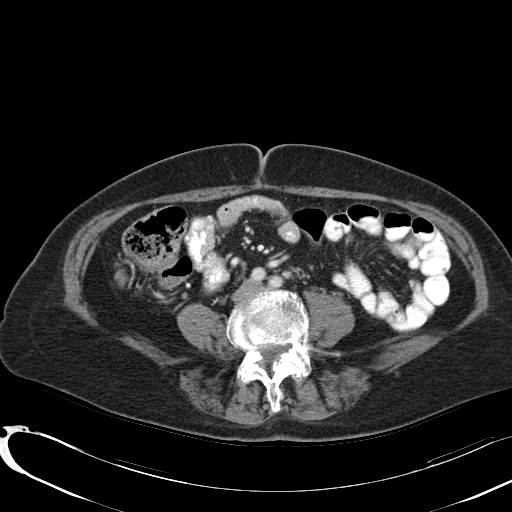
[im 47/94  soft-tissue]
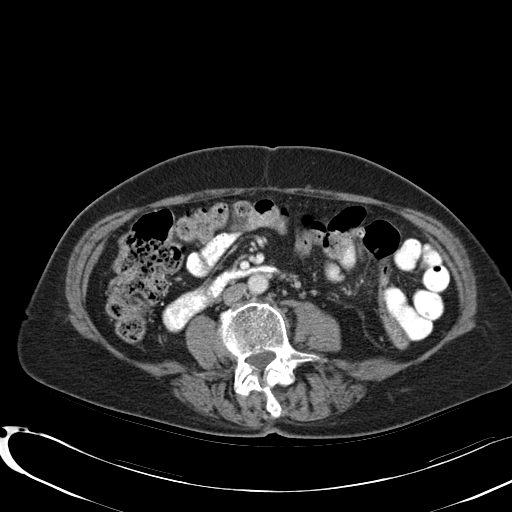
[im 52/94  soft-tissue]
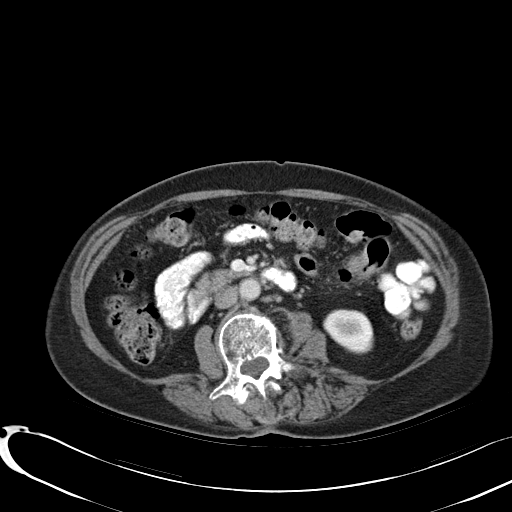
[im 63/94  soft-tissue]
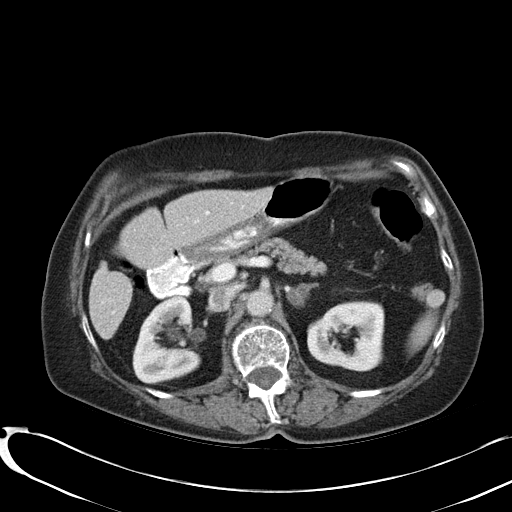
[im 63/94  bone]
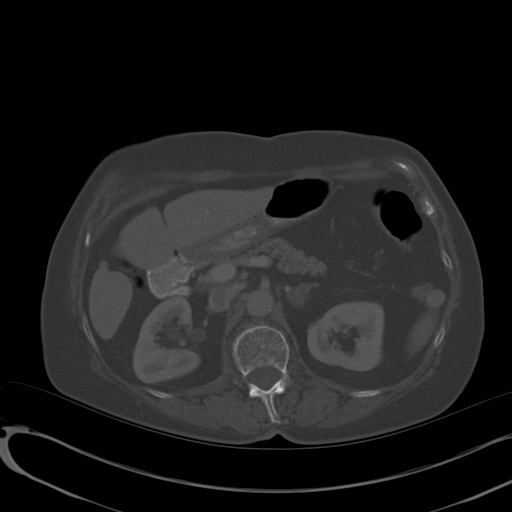
[im 68/94  soft-tissue]
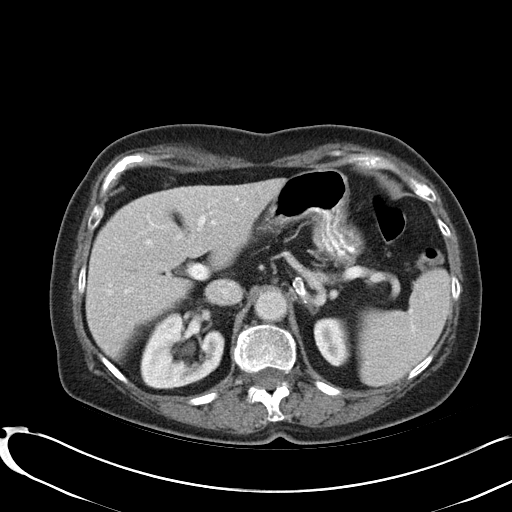
[im 73/94  soft-tissue]
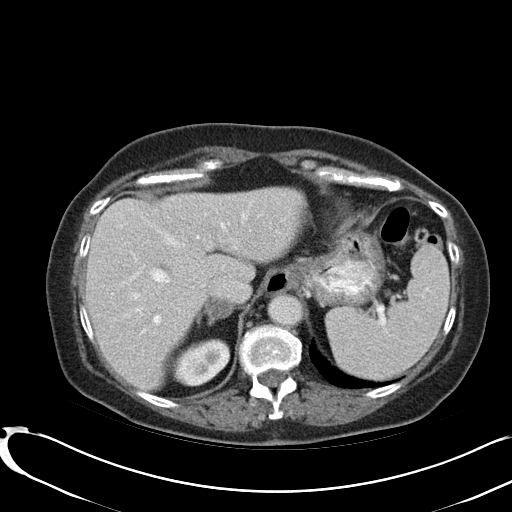
[im 83/94  soft-tissue]
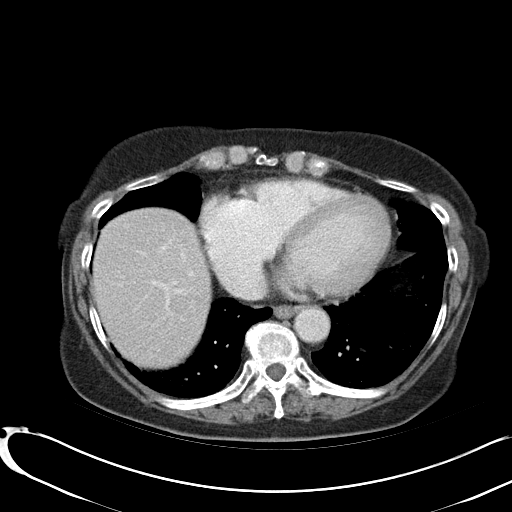
[im 88/94  soft-tissue]
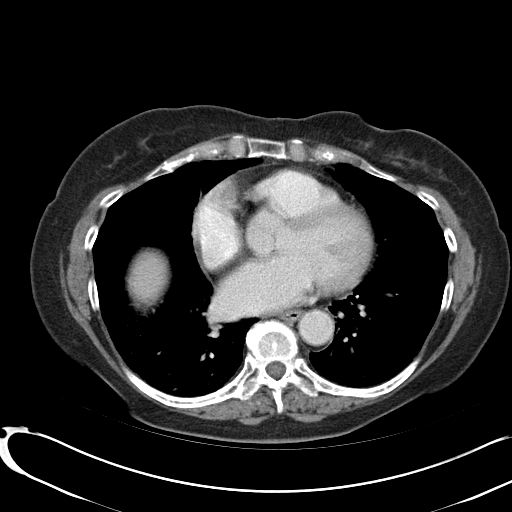

[Series 4: abd_pel_with 3.0 spo · coronal · 0.71mm/px · 3 of 70 slices shown]
[im 24/70  soft-tissue]
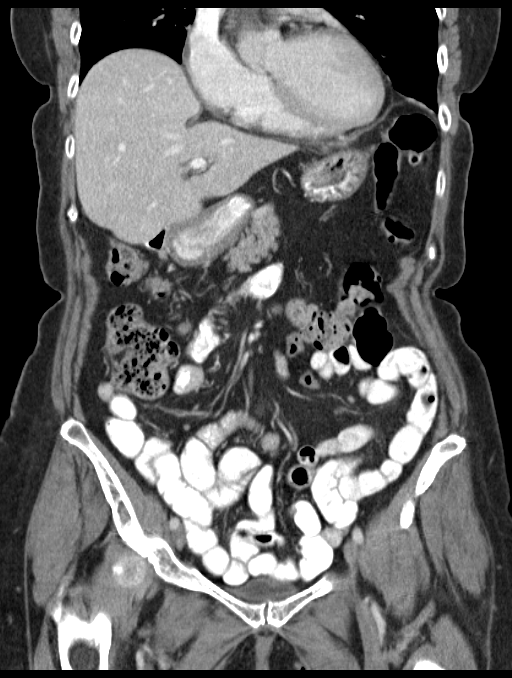
[im 31/70  soft-tissue]
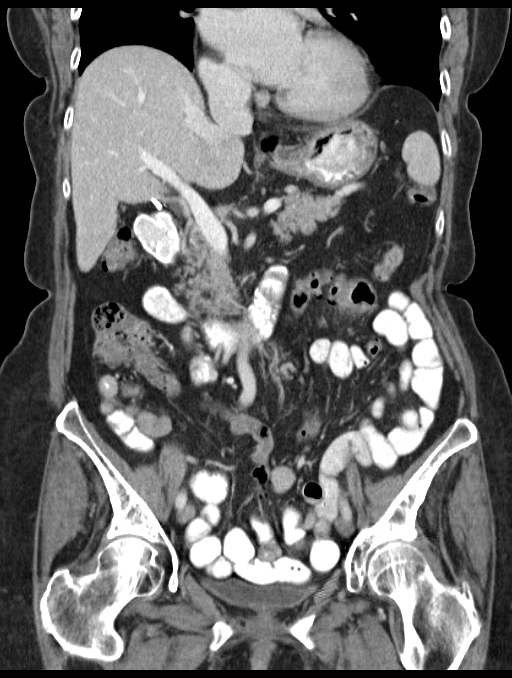
[im 39/70  soft-tissue]
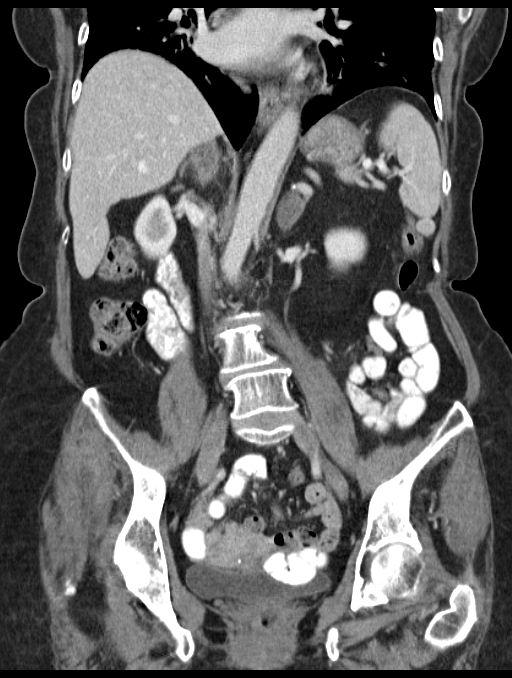

[16 of 46 positions shown; findings below may reference images not displayed]

FINDINGS: Lower chest: Subpleural reticulation/dependent atelectasis at the
lung bases.

Cardiomegaly.

Hepatobiliary: 5 mm probable cyst in the left hepatic lobe (series
2/ image 20).

Status post cholecystectomy. No intrahepatic or extrahepatic ductal
dilatation.

Pancreas: Within normal limits.

Spleen: Within normal limits.

Adrenals/Urinary Tract: Bilateral adrenal adenomas, measuring 1.8 cm
on the right and 2.2 cm on the left.

Kidneys are within normal limits.  No hydronephrosis.

Bladder is within normal limits.

Stomach/Bowel: Stomach is within normal limits.

No evidence of bowel obstruction.

Normal appendix.

Vascular/Lymphatic: Atherosclerotic calcifications of the abdominal
aorta and branch vessels.

No suspicious abdominopelvic lymphadenopathy.

Reproductive: Uterus is heterogeneous with suspected calcified
uterine fibroids.

Bilateral ovaries are unremarkable.

Other: No abdominopelvic ascites.

Musculoskeletal: Degenerative changes of the visualized
thoracolumbar spine.
IMPRESSION: No evidence of bowel obstruction.  Normal appendix.

Bilateral adrenal adenomas.

Uterine fibroids.

Prior cholecystectomy.

## 2016-12-11 ENCOUNTER — Ambulatory Visit (INDEPENDENT_AMBULATORY_CARE_PROVIDER_SITE_OTHER): Payer: Medicare HMO | Admitting: *Deleted

## 2016-12-11 DIAGNOSIS — I4891 Unspecified atrial fibrillation: Secondary | ICD-10-CM

## 2016-12-11 DIAGNOSIS — Z5181 Encounter for therapeutic drug level monitoring: Secondary | ICD-10-CM

## 2016-12-11 LAB — POCT INR: INR: 2.7

## 2017-01-22 ENCOUNTER — Ambulatory Visit (INDEPENDENT_AMBULATORY_CARE_PROVIDER_SITE_OTHER): Payer: Medicare HMO | Admitting: *Deleted

## 2017-01-22 DIAGNOSIS — I4891 Unspecified atrial fibrillation: Secondary | ICD-10-CM

## 2017-01-22 DIAGNOSIS — Z5181 Encounter for therapeutic drug level monitoring: Secondary | ICD-10-CM

## 2017-01-22 LAB — POCT INR: INR: 1.8

## 2017-02-05 ENCOUNTER — Encounter: Payer: Self-pay | Admitting: Cardiovascular Disease

## 2017-02-05 ENCOUNTER — Ambulatory Visit: Payer: Medicare HMO | Admitting: Cardiovascular Disease

## 2017-02-05 VITALS — BP 118/80 | HR 70 | Ht 64.0 in | Wt 150.0 lb

## 2017-02-05 DIAGNOSIS — I429 Cardiomyopathy, unspecified: Secondary | ICD-10-CM

## 2017-02-05 DIAGNOSIS — I340 Nonrheumatic mitral (valve) insufficiency: Secondary | ICD-10-CM

## 2017-02-05 DIAGNOSIS — I482 Chronic atrial fibrillation: Secondary | ICD-10-CM

## 2017-02-05 DIAGNOSIS — I10 Essential (primary) hypertension: Secondary | ICD-10-CM | POA: Diagnosis not present

## 2017-02-05 DIAGNOSIS — I4821 Permanent atrial fibrillation: Secondary | ICD-10-CM

## 2017-02-05 NOTE — Progress Notes (Signed)
SUBJECTIVE: The patient presents for follow-up of permanent atrial fibrillation and cardiomyopathy. EF 40-45% on 03/23/2014. A repeat echocardiogram on 02/15/14 demonstrated normalization of left ventricular systolic function with normal regional wall motion, LVEF 50-55%.  There was moderate mitral and mild tricuspid regurgitation.  There was severe left atrial dilatation.  She is doing well overall.  She denies chest pain and palpitations.  She seldom has shortness of breath.  She has episodic fatigue which is self-limiting.  ECG performed today demonstrates rate controlled atrial fibrillation with PVCs, 83 bpm.   Soc Hx: Her brother, Manha Amato, is also my patient.   Review of Systems: As per "subjective", otherwise negative.  Allergies  Allergen Reactions  . Chocolate     Numb feeling around mouth   . Chocolate   . Peanuts [Peanut Oil] Itching  . Peanuts [Peanut Oil]     hives  . Penicillins     Unknown   . Penicillins     unknown    Current Outpatient Medications  Medication Sig Dispense Refill  . acetaminophen (TYLENOL) 650 MG CR tablet Take 650 mg by mouth every 8 (eight) hours as needed for pain.    . calcium carbonate (TUMS - DOSED IN MG ELEMENTAL CALCIUM) 500 MG chewable tablet Chew 1 tablet by mouth daily as needed for indigestion or heartburn.    . cyanocobalamin (,VITAMIN B-12,) 1000 MCG/ML injection Inject 1,000 mcg into the muscle every 14 (fourteen) days.    . digoxin (LANOXIN) 0.125 MG tablet TAKE (1) TABLET BY MOUTH ONCE DAILY. 30 tablet 6  . diltiazem (CARDIZEM CD) 120 MG 24 hr capsule TAKE 1 CAPSULE IN THE EVENING. 90 capsule 3  . diltiazem (TAZTIA XT) 240 MG 24 hr capsule Take 1 capsule (240 mg total) by mouth every morning. 90 capsule 3  . metFORMIN (GLUCOPHAGE) 500 MG tablet Take by mouth 2 (two) times daily with a meal.    . metoprolol tartrate (LOPRESSOR) 50 MG tablet TAKE (1) TABLET TWICE DAILY. 60 tablet 6  . pantoprazole (PROTONIX) 40 MG  tablet TAKE (1) TABLET TWICE A DAY BEFORE MEALS. 60 tablet 5  . sennosides-docusate sodium (SENOKOT-S) 8.6-50 MG tablet Take 1 tablet by mouth 2 (two) times daily.    . Simethicone (PHAZYME) 180 MG CAPS Take 180 mg by mouth daily.    . Vitamin D, Ergocalciferol, (DRISDOL) 50000 UNITS CAPS capsule Take 50,000 Units by mouth every 7 (seven) days. Mondays    . warfarin (COUMADIN) 5 MG tablet Take 1/2 tablet daily except 1 tablet on Mondays, Wednesdays and Fridays 30 tablet 4  . Wheat Dextrin (BENEFIBER DRINK MIX) PACK Take 4 g by mouth at bedtime.     No current facility-administered medications for this visit.     Past Medical History:  Diagnosis Date  . Arthritis    "in my back/gastro dr" (03/22/2014)  . Atrial flutter ([place])    a. Pt reports episode 02/2013 noted pre-op cataracts but was in NSR at time of f/u. Saw cardiology and was told she had a leaky heart valve.  . Family history of adverse reaction to anesthesia    "some people in my family have; don't know what happened though" (03/22/2014)  . GERD (gastroesophageal reflux disease)   . GI problem    a. Pt reports workup for abdominal sensitivity with unremarkable endoscopy and CT.  [name] Heart valve disease    leaking heart valve  . Hypercholesterolemia   . Hypertension   . Osteopenia   .  Seasonal allergies   . Valvular heart disease     Past Surgical History:  Procedure Laterality Date  . BACTERIAL OVERGROWTH TEST N/A 08/13/2014   Procedure: BACTERIAL OVERGROWTH TEST;  Surgeon: Rogene Houston, MD;  [facility];  Service: Endoscopy;  Laterality: N/A;  730  . CATARACT EXTRACTION W/ INTRAOCULAR LENS  IMPLANT, BILATERAL Bilateral 2015  . CATARACT EXTRACTION W/PHACO Right 06/30/2013   Procedure: CATARACT EXTRACTION PHACO AND INTRAOCULAR LENS PLACEMENT (IOC);  Surgeon: Elta Guadeloupe T. Gershon Crane, MD;  Location: [facility];  Service: Ophthalmology;  Laterality: Right;  CDE:7.99  . CATARACT EXTRACTION W/PHACO Left 07/14/2013   Procedure:  CATARACT EXTRACTION PHACO AND INTRAOCULAR LENS PLACEMENT (IOC);  Surgeon: Elta Guadeloupe T. Gershon Crane, MD;  Location: [facility];  Service: Ophthalmology;  Laterality: Left;  CDE:  8.25  . CHOLECYSTECTOMY  2000?   [facility]  . ESOPHAGOGASTRODUODENOSCOPY N/A 01/20/2014   Procedure: ESOPHAGOGASTRODUODENOSCOPY (EGD);  Surgeon: Rogene Houston, MD;  [facility];  Service: Endoscopy;  Laterality: N/A;  120 - moved to 12:25 - [name] to notify pt  . LAPAROSCOPIC CHOLECYSTECTOMY  ~ 2000  . TONSILLECTOMY  1943   [facility]-no longer present  . TONSILLECTOMY  ~ 1943    Social History   Socioeconomic History  . Marital status: Single    Spouse name: Not on file  . Number of children: Not on file  . Years of education: Not on file  . Highest education level: Not on file  Social Needs  . Financial resource strain: Not on file  . Food insecurity - worry: Not on file  . Food insecurity - inability: Not on file  . Transportation needs - medical: Not on file  . Transportation needs - non-medical: Not on file  Occupational History  . Not on file  Tobacco Use  . Smoking status: Former Smoker    Packs/day: 0.75    Years: 15.00    Pack years: 11.25    Types: Cigarettes    Last attempt to quit: 03/18/1974    Years since quitting: 42.9  . Smokeless tobacco: Never Used  . Tobacco comment: "quit smoking cigarettes in the 1970's"  Substance and Sexual Activity  . Alcohol use: No    Alcohol/week: 0.0 oz  . Drug use: No  . Sexual activity: No  Other Topics Concern  . Not on file  Social History Narrative   ** Merged History Encounter **         Vitals:   02/05/17 1521  BP: 118/80  Pulse: 70  SpO2: 96%  Weight: 150 lb (68 kg)  Height: 5\' 4"  (1.626 m)    Wt Readings from Last 3 Encounters:  02/05/17 150 lb (68 kg)  01/24/16 151 lb (68.5 kg)  07/02/15 150 lb (68 kg)     PHYSICAL EXAM General: NAD HEENT: Normal. Neck: No JVD, no thyromegaly. Lungs: Clear to auscultation  bilaterally with normal respiratory effort. CV: Regular rate and irregular rhythm, normal S1/S2, no S3, no murmur. No pretibial or periankle edema.  No carotid bruit.   Abdomen: Soft, nontender, no distention.  Neurologic: Alert and oriented.  Psych: Normal affect. Skin: Normal. Musculoskeletal: No gross deformities.    ECG: Most recent ECG reviewed.   Labs: Lab Results  Component Value Date/Time   K 3.6 03/23/2014 04:42 AM   BUN 8 03/23/2014 04:42 AM   CREATININE 0.67 03/23/2014 04:42 AM   ALT 22 03/23/2014 04:42 AM   HGB 13.1 03/23/2014 04:42 AM  Lipids: Lab Results  Component Value Date/Time   LDLCALC 78 03/23/2014 04:42 AM   CHOL 138 03/23/2014 04:42 AM   TRIG 99 03/23/2014 04:42 AM   HDL 40 03/23/2014 04:42 AM       ASSESSMENT AND PLAN: 1.  Permanent atrial fibrillation: Symptomatically stable on diltiazem, metoprolol, and digoxin.  Anticoagulated with warfarin.  No changes to therapy.  2.  Nonischemic cardiomyopathy: Left ventricular systolic function normalized by echocardiogram in December 2017 as reviewed above.  Continue present medical therapy.  3.  Moderate mitral regurgitation: Stable.  4.  Chronic hypertension: Controlled.  No changes.    Disposition: Follow up 1 year   Kate Sable, M.D., F.A.C.C.

## 2017-02-05 NOTE — Patient Instructions (Signed)

## 2017-02-16 ENCOUNTER — Other Ambulatory Visit: Payer: Self-pay | Admitting: Cardiovascular Disease

## 2017-02-16 ENCOUNTER — Other Ambulatory Visit (INDEPENDENT_AMBULATORY_CARE_PROVIDER_SITE_OTHER): Payer: Self-pay | Admitting: Internal Medicine

## 2017-02-21 ENCOUNTER — Ambulatory Visit (INDEPENDENT_AMBULATORY_CARE_PROVIDER_SITE_OTHER): Payer: Medicare HMO | Admitting: *Deleted

## 2017-02-21 DIAGNOSIS — I4891 Unspecified atrial fibrillation: Secondary | ICD-10-CM | POA: Diagnosis not present

## 2017-02-21 DIAGNOSIS — Z5181 Encounter for therapeutic drug level monitoring: Secondary | ICD-10-CM

## 2017-02-21 LAB — POCT INR: INR: 2.3

## 2017-02-21 NOTE — Patient Instructions (Signed)
Continue coumadin 1 tablet daily except 1/2 tablet on Sundays, Tuesdays, Thursdays. Recheck in 4 weeks

## 2017-03-07 DIAGNOSIS — I471 Supraventricular tachycardia: Secondary | ICD-10-CM | POA: Diagnosis not present

## 2017-03-07 DIAGNOSIS — E1165 Type 2 diabetes mellitus with hyperglycemia: Secondary | ICD-10-CM | POA: Diagnosis not present

## 2017-03-07 DIAGNOSIS — Z299 Encounter for prophylactic measures, unspecified: Secondary | ICD-10-CM | POA: Diagnosis not present

## 2017-03-07 DIAGNOSIS — E663 Overweight: Secondary | ICD-10-CM | POA: Diagnosis not present

## 2017-03-07 DIAGNOSIS — I4892 Unspecified atrial flutter: Secondary | ICD-10-CM | POA: Diagnosis not present

## 2017-03-21 ENCOUNTER — Ambulatory Visit (INDEPENDENT_AMBULATORY_CARE_PROVIDER_SITE_OTHER): Payer: Medicare HMO | Admitting: *Deleted

## 2017-03-21 DIAGNOSIS — Z5181 Encounter for therapeutic drug level monitoring: Secondary | ICD-10-CM | POA: Diagnosis not present

## 2017-03-21 DIAGNOSIS — I4891 Unspecified atrial fibrillation: Secondary | ICD-10-CM | POA: Diagnosis not present

## 2017-03-21 LAB — POCT INR: INR: 1.6

## 2017-03-21 NOTE — Patient Instructions (Signed)
Take coumadin extra 1 tablet tonight then increase dose to 1 tablet daily except 1/2 tablet on Sundays and Thursdays. Recheck in 3 weeks

## 2017-04-11 ENCOUNTER — Ambulatory Visit (INDEPENDENT_AMBULATORY_CARE_PROVIDER_SITE_OTHER): Payer: Medicare HMO | Admitting: *Deleted

## 2017-04-11 DIAGNOSIS — I4891 Unspecified atrial fibrillation: Secondary | ICD-10-CM | POA: Diagnosis not present

## 2017-04-11 DIAGNOSIS — Z5181 Encounter for therapeutic drug level monitoring: Secondary | ICD-10-CM

## 2017-04-11 LAB — POCT INR: INR: 2.6

## 2017-04-11 NOTE — Patient Instructions (Signed)
Continue coumadin 1 tablet daily except 1/2 tablet on Sundays and Thursdays Recheck in 4 weeks 

## 2017-05-01 ENCOUNTER — Other Ambulatory Visit: Payer: Self-pay | Admitting: Cardiovascular Disease

## 2017-05-08 ENCOUNTER — Other Ambulatory Visit: Payer: Self-pay | Admitting: Cardiovascular Disease

## 2017-05-09 ENCOUNTER — Ambulatory Visit (INDEPENDENT_AMBULATORY_CARE_PROVIDER_SITE_OTHER): Payer: Medicare HMO | Admitting: *Deleted

## 2017-05-09 DIAGNOSIS — Z5181 Encounter for therapeutic drug level monitoring: Secondary | ICD-10-CM

## 2017-05-09 DIAGNOSIS — I4891 Unspecified atrial fibrillation: Secondary | ICD-10-CM | POA: Diagnosis not present

## 2017-05-09 LAB — POCT INR: INR: 1.8

## 2017-05-09 NOTE — Patient Instructions (Signed)
Take 1 tablet tonight then resume 1 tablet daily except 1/2 tablet on Sundays and Thursdays. Recheck in 4 weeks

## 2017-06-06 ENCOUNTER — Ambulatory Visit (INDEPENDENT_AMBULATORY_CARE_PROVIDER_SITE_OTHER): Payer: Medicare HMO | Admitting: *Deleted

## 2017-06-06 DIAGNOSIS — Z5181 Encounter for therapeutic drug level monitoring: Secondary | ICD-10-CM

## 2017-06-06 DIAGNOSIS — I4891 Unspecified atrial fibrillation: Secondary | ICD-10-CM | POA: Diagnosis not present

## 2017-06-06 LAB — POCT INR: INR: 2.4

## 2017-06-06 NOTE — Patient Instructions (Signed)
Continue coumadin 1 tablet daily except 1/2 tablet on Sundays and Thursdays. Recheck in 5 weeks

## 2017-06-11 DIAGNOSIS — I10 Essential (primary) hypertension: Secondary | ICD-10-CM | POA: Diagnosis not present

## 2017-06-11 DIAGNOSIS — Z299 Encounter for prophylactic measures, unspecified: Secondary | ICD-10-CM | POA: Diagnosis not present

## 2017-06-11 DIAGNOSIS — I471 Supraventricular tachycardia: Secondary | ICD-10-CM | POA: Diagnosis not present

## 2017-06-11 DIAGNOSIS — I4892 Unspecified atrial flutter: Secondary | ICD-10-CM | POA: Diagnosis not present

## 2017-06-11 DIAGNOSIS — E663 Overweight: Secondary | ICD-10-CM | POA: Diagnosis not present

## 2017-06-11 DIAGNOSIS — Z87891 Personal history of nicotine dependence: Secondary | ICD-10-CM | POA: Diagnosis not present

## 2017-06-11 DIAGNOSIS — E1165 Type 2 diabetes mellitus with hyperglycemia: Secondary | ICD-10-CM | POA: Diagnosis not present

## 2017-07-11 ENCOUNTER — Ambulatory Visit (INDEPENDENT_AMBULATORY_CARE_PROVIDER_SITE_OTHER): Payer: Medicare HMO | Admitting: *Deleted

## 2017-07-11 DIAGNOSIS — I4891 Unspecified atrial fibrillation: Secondary | ICD-10-CM | POA: Diagnosis not present

## 2017-07-11 DIAGNOSIS — Z5181 Encounter for therapeutic drug level monitoring: Secondary | ICD-10-CM | POA: Diagnosis not present

## 2017-07-11 LAB — POCT INR: INR: 2.3 (ref 2.0–3.0)

## 2017-07-11 NOTE — Patient Instructions (Signed)
Continue coumadin 1 tablet daily except 1/2 tablet on Sundays and Thursdays Recheck in 6 weeks 

## 2017-08-19 DIAGNOSIS — Z6824 Body mass index (BMI) 24.0-24.9, adult: Secondary | ICD-10-CM | POA: Diagnosis not present

## 2017-08-19 DIAGNOSIS — I471 Supraventricular tachycardia: Secondary | ICD-10-CM | POA: Diagnosis not present

## 2017-08-19 DIAGNOSIS — Z1339 Encounter for screening examination for other mental health and behavioral disorders: Secondary | ICD-10-CM | POA: Diagnosis not present

## 2017-08-19 DIAGNOSIS — Z299 Encounter for prophylactic measures, unspecified: Secondary | ICD-10-CM | POA: Diagnosis not present

## 2017-08-19 DIAGNOSIS — Z1331 Encounter for screening for depression: Secondary | ICD-10-CM | POA: Diagnosis not present

## 2017-08-19 DIAGNOSIS — E1165 Type 2 diabetes mellitus with hyperglycemia: Secondary | ICD-10-CM | POA: Diagnosis not present

## 2017-08-19 DIAGNOSIS — Z0000 Encounter for general adult medical examination without abnormal findings: Secondary | ICD-10-CM | POA: Diagnosis not present

## 2017-08-19 DIAGNOSIS — Z7189 Other specified counseling: Secondary | ICD-10-CM | POA: Diagnosis not present

## 2017-08-19 DIAGNOSIS — Z79899 Other long term (current) drug therapy: Secondary | ICD-10-CM | POA: Diagnosis not present

## 2017-08-19 DIAGNOSIS — M810 Age-related osteoporosis without current pathological fracture: Secondary | ICD-10-CM | POA: Diagnosis not present

## 2017-08-19 DIAGNOSIS — R5383 Other fatigue: Secondary | ICD-10-CM | POA: Diagnosis not present

## 2017-08-19 DIAGNOSIS — D518 Other vitamin B12 deficiency anemias: Secondary | ICD-10-CM | POA: Diagnosis not present

## 2017-08-29 ENCOUNTER — Ambulatory Visit (INDEPENDENT_AMBULATORY_CARE_PROVIDER_SITE_OTHER): Payer: Medicare HMO | Admitting: *Deleted

## 2017-08-29 DIAGNOSIS — Z5181 Encounter for therapeutic drug level monitoring: Secondary | ICD-10-CM

## 2017-08-29 DIAGNOSIS — I4891 Unspecified atrial fibrillation: Secondary | ICD-10-CM

## 2017-08-29 LAB — POCT INR: INR: 2.4 (ref 2.0–3.0)

## 2017-08-29 NOTE — Patient Instructions (Signed)
Continue coumadin 1 tablet daily except 1/2 tablet on Sundays and Thursdays Recheck in 6 weeks 

## 2017-09-17 DIAGNOSIS — Z6824 Body mass index (BMI) 24.0-24.9, adult: Secondary | ICD-10-CM | POA: Diagnosis not present

## 2017-09-17 DIAGNOSIS — I10 Essential (primary) hypertension: Secondary | ICD-10-CM | POA: Diagnosis not present

## 2017-09-17 DIAGNOSIS — E1165 Type 2 diabetes mellitus with hyperglycemia: Secondary | ICD-10-CM | POA: Diagnosis not present

## 2017-09-17 DIAGNOSIS — Z299 Encounter for prophylactic measures, unspecified: Secondary | ICD-10-CM | POA: Diagnosis not present

## 2017-09-17 DIAGNOSIS — I4892 Unspecified atrial flutter: Secondary | ICD-10-CM | POA: Diagnosis not present

## 2017-09-17 DIAGNOSIS — I471 Supraventricular tachycardia: Secondary | ICD-10-CM | POA: Diagnosis not present

## 2017-10-10 ENCOUNTER — Ambulatory Visit (INDEPENDENT_AMBULATORY_CARE_PROVIDER_SITE_OTHER): Payer: Medicare HMO | Admitting: *Deleted

## 2017-10-10 DIAGNOSIS — Z5181 Encounter for therapeutic drug level monitoring: Secondary | ICD-10-CM

## 2017-10-10 DIAGNOSIS — I4891 Unspecified atrial fibrillation: Secondary | ICD-10-CM | POA: Diagnosis not present

## 2017-10-10 LAB — POCT INR: INR: 2.5 (ref 2.0–3.0)

## 2017-10-10 NOTE — Patient Instructions (Signed)
Description   Continue coumadin 1 tablet daily except 1/2 tablet on Sundays and Thursdays. Recheck in 6 weeks.      

## 2017-10-18 ENCOUNTER — Other Ambulatory Visit: Payer: Self-pay | Admitting: Cardiovascular Disease

## 2017-11-21 ENCOUNTER — Ambulatory Visit (INDEPENDENT_AMBULATORY_CARE_PROVIDER_SITE_OTHER): Payer: Medicare HMO | Admitting: Pharmacist

## 2017-11-21 DIAGNOSIS — Z5181 Encounter for therapeutic drug level monitoring: Secondary | ICD-10-CM | POA: Diagnosis not present

## 2017-11-21 DIAGNOSIS — I4891 Unspecified atrial fibrillation: Secondary | ICD-10-CM

## 2017-11-21 LAB — POCT INR: INR: 2 (ref 2.0–3.0)

## 2017-11-21 NOTE — Patient Instructions (Signed)
Description   Continue coumadin 1 tablet daily except 1/2 tablet on Sundays and Thursdays. Recheck in 6 weeks.      

## 2017-11-22 ENCOUNTER — Other Ambulatory Visit: Payer: Self-pay

## 2017-11-22 MED ORDER — DIGOXIN 125 MCG PO TABS: ORAL_TABLET | ORAL | 6 refills | Status: DC

## 2017-12-26 ENCOUNTER — Telehealth: Payer: Self-pay | Admitting: Cardiovascular Disease

## 2017-12-26 DIAGNOSIS — E1165 Type 2 diabetes mellitus with hyperglycemia: Secondary | ICD-10-CM | POA: Diagnosis not present

## 2017-12-26 DIAGNOSIS — Z6824 Body mass index (BMI) 24.0-24.9, adult: Secondary | ICD-10-CM | POA: Diagnosis not present

## 2017-12-26 DIAGNOSIS — I4892 Unspecified atrial flutter: Secondary | ICD-10-CM | POA: Diagnosis not present

## 2017-12-26 DIAGNOSIS — I10 Essential (primary) hypertension: Secondary | ICD-10-CM | POA: Diagnosis not present

## 2017-12-26 DIAGNOSIS — I471 Supraventricular tachycardia: Secondary | ICD-10-CM | POA: Diagnosis not present

## 2017-12-26 DIAGNOSIS — Z299 Encounter for prophylactic measures, unspecified: Secondary | ICD-10-CM | POA: Diagnosis not present

## 2017-12-26 NOTE — Telephone Encounter (Signed)
Patient called stating that she saw her PCP  - Elgie Congo today . She states she is having tingling in both hands and legs. She states that she is having more shortness of breath. PCP advised patient to try and see Dr. Renelda Loma sooner than December.

## 2017-12-26 NOTE — Telephone Encounter (Signed)
Kerin Ransom has something on 01/17/18, but it has that 7 day hold on it - not sure about scheduling there.  Dr. Bronson Ing has nothing before then end of December.  Can you please schedule her if this is okay?

## 2017-12-31 DIAGNOSIS — R69 Illness, unspecified: Secondary | ICD-10-CM | POA: Diagnosis not present

## 2017-12-31 NOTE — Telephone Encounter (Signed)
Appointment was made for 01/17/2018.  Patient aware.

## 2018-01-02 ENCOUNTER — Ambulatory Visit (INDEPENDENT_AMBULATORY_CARE_PROVIDER_SITE_OTHER): Payer: Medicare HMO | Admitting: *Deleted

## 2018-01-02 DIAGNOSIS — I4891 Unspecified atrial fibrillation: Secondary | ICD-10-CM | POA: Diagnosis not present

## 2018-01-02 DIAGNOSIS — Z5181 Encounter for therapeutic drug level monitoring: Secondary | ICD-10-CM | POA: Diagnosis not present

## 2018-01-02 LAB — POCT INR: INR: 2.1 (ref 2.0–3.0)

## 2018-01-02 NOTE — Patient Instructions (Signed)
Continue coumadin 1 tablet daily except 1/2 tablet on Sundays and Thursdays Recheck in 6 weeks 

## 2018-01-03 ENCOUNTER — Other Ambulatory Visit: Payer: Self-pay | Admitting: Cardiovascular Disease

## 2018-01-13 ENCOUNTER — Other Ambulatory Visit (INDEPENDENT_AMBULATORY_CARE_PROVIDER_SITE_OTHER): Payer: Self-pay | Admitting: Internal Medicine

## 2018-01-14 ENCOUNTER — Other Ambulatory Visit: Payer: Self-pay | Admitting: *Deleted

## 2018-01-14 MED ORDER — WARFARIN SODIUM 5 MG PO TABS: ORAL_TABLET | ORAL | 6 refills | Status: DC

## 2018-01-17 ENCOUNTER — Encounter

## 2018-01-17 ENCOUNTER — Ambulatory Visit: Payer: Medicare HMO | Admitting: Cardiology

## 2018-02-18 ENCOUNTER — Encounter

## 2018-02-18 ENCOUNTER — Encounter: Payer: Self-pay | Admitting: Cardiovascular Disease

## 2018-02-18 ENCOUNTER — Ambulatory Visit: Payer: Medicare HMO | Admitting: Cardiovascular Disease

## 2018-02-18 ENCOUNTER — Ambulatory Visit (INDEPENDENT_AMBULATORY_CARE_PROVIDER_SITE_OTHER): Payer: Medicare HMO | Admitting: *Deleted

## 2018-02-18 VITALS — BP 102/64 | HR 64 | Ht 64.0 in | Wt 141.0 lb

## 2018-02-18 DIAGNOSIS — R0602 Shortness of breath: Secondary | ICD-10-CM

## 2018-02-18 DIAGNOSIS — I10 Essential (primary) hypertension: Secondary | ICD-10-CM

## 2018-02-18 DIAGNOSIS — I4891 Unspecified atrial fibrillation: Secondary | ICD-10-CM | POA: Diagnosis not present

## 2018-02-18 DIAGNOSIS — I340 Nonrheumatic mitral (valve) insufficiency: Secondary | ICD-10-CM | POA: Diagnosis not present

## 2018-02-18 DIAGNOSIS — Z5181 Encounter for therapeutic drug level monitoring: Secondary | ICD-10-CM

## 2018-02-18 DIAGNOSIS — I4821 Permanent atrial fibrillation: Secondary | ICD-10-CM

## 2018-02-18 DIAGNOSIS — I429 Cardiomyopathy, unspecified: Secondary | ICD-10-CM | POA: Diagnosis not present

## 2018-02-18 LAB — POCT INR: INR: 2.1 (ref 2.0–3.0)

## 2018-02-18 NOTE — Patient Instructions (Signed)
Description   Continue coumadin 1 tablet daily except 1/2 tablet on Sundays and Thursdays. Recheck in 6 weeks.

## 2018-02-18 NOTE — Progress Notes (Signed)
SUBJECTIVE: The patient presents for follow-up of permanent atrial fibrillation and cardiomyopathy. EF 40-45% on 03/23/2014. A repeat echocardiogram on 02/15/14 demonstrated normalization of left ventricular systolic function with normal regional wall motion, LVEF 50-55%.  There was moderate mitral and mild tricuspid regurgitation.  There was severe left atrial dilatation.  She called our office on 12/26/2017 stating she is having more shortness of breath.  She tells me that for the past 2 to 3 months she has had tingling and numbness in her arms, hands, legs, and feet.  She denies leg swelling.  She denies orthopnea and paroxysmal nocturnal dyspnea.  She said symptoms are inconsistent.  She has episodic shortness of breath but it has not been bothering her recently.  She denies chest pain and palpitations.  She denies weakness of arms and legs.    Soc Hx: Her brother, Anaalicia Reimann, is also my patient.  Review of Systems: As per "subjective", otherwise negative.  Allergies  Allergen Reactions  . Chocolate     Numb feeling around mouth   . Chocolate   . Peanuts [Peanut Oil] Itching  . Peanuts [Peanut Oil]     hives  . Penicillins     Unknown   . Penicillins     unknown    Current Outpatient Medications  Medication Sig Dispense Refill  . acetaminophen (TYLENOL) 650 MG CR tablet Take 650 mg by mouth every 8 (eight) hours as needed for pain.    . calcium carbonate (TUMS - DOSED IN MG ELEMENTAL CALCIUM) 500 MG chewable tablet Chew 1 tablet by mouth daily as needed for indigestion or heartburn.    . cyanocobalamin (,VITAMIN B-12,) 1000 MCG/ML injection Inject 1,000 mcg into the muscle every 14 (fourteen) days.    . digoxin (LANOXIN) 0.125 MG tablet TAKE 1 TABLET ONCE DAILY. 30 tablet 6  . diltiazem (CARDIZEM CD) 120 MG 24 hr capsule TAKE 1 CAPSULE IN THE EVENING. 90 capsule 2  . diltiazem (TAZTIA XT) 240 MG 24 hr capsule Take 1 capsule (240 mg total) by mouth every morning. 90  capsule 3  . lubiprostone (AMITIZA) 8 MCG capsule Take 8 mcg by mouth 2 (two) times daily with a meal.    . metFORMIN (GLUCOPHAGE) 500 MG tablet Take by mouth daily with breakfast.     . metoprolol tartrate (LOPRESSOR) 50 MG tablet TAKE (1) TABLET TWICE DAILY. 180 tablet 2  . pantoprazole (PROTONIX) 40 MG tablet TAKE (1) TABLET TWICE A DAY BEFORE MEALS. 60 tablet 5  . sennosides-docusate sodium (SENOKOT-S) 8.6-50 MG tablet Take 1 tablet by mouth 2 (two) times daily.    . Simethicone (PHAZYME) 180 MG CAPS Take 180 mg by mouth daily.    . Vitamin D, Ergocalciferol, (DRISDOL) 50000 UNITS CAPS capsule Take 50,000 Units by mouth every 7 (seven) days. Mondays    . warfarin (COUMADIN) 5 MG tablet Take 1 tablet daily except 1/2 tablet on Sundays and Thursdays 30 tablet 6  . Wheat Dextrin (BENEFIBER DRINK MIX) PACK Take 4 g by mouth at bedtime.     No current facility-administered medications for this visit.     Past Medical History:  Diagnosis Date  . Arthritis    "in my back/gastro dr" (03/22/2014)  . Atrial flutter ([place])    a. Pt reports episode 02/2013 noted pre-op cataracts but was in NSR at time of f/u. Saw cardiology and was told she had a leaky heart valve.  . Family history of adverse reaction to anesthesia    "  some people in my family have; don't know what happened though" (03/22/2014)  . GERD (gastroesophageal reflux disease)   . GI problem    a. Pt reports workup for abdominal sensitivity with unremarkable endoscopy and CT.  [name] Heart valve disease    leaking heart valve  . Hypercholesterolemia   . Hypertension   . Osteopenia   . Seasonal allergies   . Valvular heart disease     Past Surgical History:  Procedure Laterality Date  . BACTERIAL OVERGROWTH TEST N/A 08/13/2014   Procedure: BACTERIAL OVERGROWTH TEST;  Surgeon: Rogene Houston, MD;  [facility];  Service: Endoscopy;  Laterality: N/A;  730  . CATARACT EXTRACTION W/ INTRAOCULAR LENS  IMPLANT, BILATERAL Bilateral  2015  . CATARACT EXTRACTION W/PHACO Right 06/30/2013   Procedure: CATARACT EXTRACTION PHACO AND INTRAOCULAR LENS PLACEMENT (IOC);  Surgeon: Elta Guadeloupe T. Gershon Crane, MD;  Location: [facility];  Service: Ophthalmology;  Laterality: Right;  CDE:7.99  . CATARACT EXTRACTION W/PHACO Left 07/14/2013   Procedure: CATARACT EXTRACTION PHACO AND INTRAOCULAR LENS PLACEMENT (IOC);  Surgeon: Elta Guadeloupe T. Gershon Crane, MD;  Location: [facility];  Service: Ophthalmology;  Laterality: Left;  CDE:  8.25  . CHOLECYSTECTOMY  2000?   [facility]  . ESOPHAGOGASTRODUODENOSCOPY N/A 01/20/2014   Procedure: ESOPHAGOGASTRODUODENOSCOPY (EGD);  Surgeon: Rogene Houston, MD;  [facility];  Service: Endoscopy;  Laterality: N/A;  120 - moved to 12:25 - [name] to notify pt  . LAPAROSCOPIC CHOLECYSTECTOMY  ~ 2000  . TONSILLECTOMY  1943   [facility]-no longer present  . TONSILLECTOMY  ~ 1943    Social History   Socioeconomic History  . Marital status: Single    Spouse name: Not on file  . Number of children: Not on file  . Years of education: Not on file  . Highest education level: Not on file  Occupational History  . Not on file  Social Needs  . Financial resource strain: Not on file  . Food insecurity:    Worry: Not on file    Inability: Not on file  . Transportation needs:    Medical: Not on file    Non-medical: Not on file  Tobacco Use  . Smoking status: Former Smoker    Packs/day: 0.75    Years: 15.00    Pack years: 11.25    Types: Cigarettes    Last attempt to quit: 03/18/1974    Years since quitting: 43.9  . Smokeless tobacco: Never Used  . Tobacco comment: "quit smoking cigarettes in the 1970's"  Substance and Sexual Activity  . Alcohol use: No    Alcohol/week: 0.0 standard drinks  . Drug use: No  . Sexual activity: Never  Lifestyle  . Physical activity:    Days per week: Not on file    Minutes per session: Not on file  . Stress: Not on file  Relationships  . Social connections:    Talks on phone: Not  on file    Gets together: Not on file    Attends religious service: Not on file    Active member of club or organization: Not on file    Attends meetings of clubs or organizations: Not on file    Relationship status: Not on file  . Intimate partner violence:    Fear of current or ex partner: Not on file    Emotionally abused: Not on file    Physically abused: Not on file    Forced sexual activity: Not on file  Other  Topics Concern  . Not on file  Social History Narrative   ** Merged History Encounter **         Vitals:   02/18/18 1449  BP: 102/64  Pulse: 64  SpO2: 97%  Weight: 141 lb (64 kg)  Height: 5\' 4"  (1.626 m)    Wt Readings from Last 3 Encounters:  02/18/18 141 lb (64 kg)  02/05/17 150 lb (68 kg)  01/24/16 151 lb (68.5 kg)     PHYSICAL EXAM General: NAD HEENT: Normal. Neck: No JVD, no thyromegaly. Lungs: Clear to auscultation bilaterally with normal respiratory effort. CV: Regular rate and irregular rhythm, normal S1/S2, no S3, no murmur. No pretibial or periankle edema.  No carotid bruit.   Abdomen: Soft, nontender, no distention.  Neurologic: Alert and oriented.  Psych: Normal affect. Skin: Normal. Musculoskeletal: No gross deformities.    ECG: Reviewed above under Subjective   Labs: Lab Results  Component Value Date/Time   K 3.6 03/23/2014 04:42 AM   BUN 8 03/23/2014 04:42 AM   CREATININE 0.67 03/23/2014 04:42 AM   ALT 22 03/23/2014 04:42 AM   HGB 13.1 03/23/2014 04:42 AM     Lipids: Lab Results  Component Value Date/Time   LDLCALC 78 03/23/2014 04:42 AM   CHOL 138 03/23/2014 04:42 AM   TRIG 99 03/23/2014 04:42 AM   HDL 40 03/23/2014 04:42 AM       ASSESSMENT AND PLAN:  1.  Permanent atrial fibrillation: Symptomatically stable on diltiazem, metoprolol, and digoxin.  Anticoagulated with warfarin.  No changes to therapy.  2.  Nonischemic cardiomyopathy: Left ventricular systolic function normalized by echocardiogram in December 2017  as reviewed above.  Continue present medical therapy.  3.  Moderate mitral regurgitation: Stable.  4.  Chronic hypertension: Controlled.  No changes.  5.  Shortness of breath: This is episodic in nature.  No evidence of decompensated heart failure.  No appreciable murmurs.  No exertional fatigue.  I asked her to keep a symptom diary.   Disposition: Follow up April 2019   Kate Sable, M.D., F.A.C.C.

## 2018-02-18 NOTE — Patient Instructions (Addendum)
Medication Instructions:  Continue all current medications.  Labwork: none  Testing/Procedures: none  Follow-Up: April 2020  Any Other Special Instructions Will Be Listed Below (If Applicable). Please keep a symptom diary for MD review.   If you need a refill on your cardiac medications before your next appointment, please call your pharmacy.

## 2018-03-17 DIAGNOSIS — I471 Supraventricular tachycardia: Secondary | ICD-10-CM | POA: Diagnosis not present

## 2018-03-17 DIAGNOSIS — Z87891 Personal history of nicotine dependence: Secondary | ICD-10-CM | POA: Diagnosis not present

## 2018-03-17 DIAGNOSIS — S00419A Abrasion of unspecified ear, initial encounter: Secondary | ICD-10-CM | POA: Diagnosis not present

## 2018-03-17 DIAGNOSIS — E1165 Type 2 diabetes mellitus with hyperglycemia: Secondary | ICD-10-CM | POA: Diagnosis not present

## 2018-03-17 DIAGNOSIS — Z299 Encounter for prophylactic measures, unspecified: Secondary | ICD-10-CM | POA: Diagnosis not present

## 2018-03-17 DIAGNOSIS — I10 Essential (primary) hypertension: Secondary | ICD-10-CM | POA: Diagnosis not present

## 2018-03-17 DIAGNOSIS — Z6824 Body mass index (BMI) 24.0-24.9, adult: Secondary | ICD-10-CM | POA: Diagnosis not present

## 2018-04-01 ENCOUNTER — Ambulatory Visit (INDEPENDENT_AMBULATORY_CARE_PROVIDER_SITE_OTHER): Payer: Medicare HMO | Admitting: Pharmacist

## 2018-04-01 DIAGNOSIS — Z5181 Encounter for therapeutic drug level monitoring: Secondary | ICD-10-CM

## 2018-04-01 DIAGNOSIS — I4891 Unspecified atrial fibrillation: Secondary | ICD-10-CM

## 2018-04-01 LAB — POCT INR: INR: 1.9 — AB (ref 2.0–3.0)

## 2018-04-01 NOTE — Patient Instructions (Signed)
Description   Take 1 and 1/2 tablets today, then continue coumadin 1 tablet daily except 1/2 tablet on Sundays and Thursdays.  Recheck in 5 weeks.

## 2018-04-02 DIAGNOSIS — Z299 Encounter for prophylactic measures, unspecified: Secondary | ICD-10-CM | POA: Diagnosis not present

## 2018-04-02 DIAGNOSIS — E1165 Type 2 diabetes mellitus with hyperglycemia: Secondary | ICD-10-CM | POA: Diagnosis not present

## 2018-04-02 DIAGNOSIS — Z87891 Personal history of nicotine dependence: Secondary | ICD-10-CM | POA: Diagnosis not present

## 2018-04-02 DIAGNOSIS — I10 Essential (primary) hypertension: Secondary | ICD-10-CM | POA: Diagnosis not present

## 2018-04-02 DIAGNOSIS — I4892 Unspecified atrial flutter: Secondary | ICD-10-CM | POA: Diagnosis not present

## 2018-04-02 DIAGNOSIS — Z6824 Body mass index (BMI) 24.0-24.9, adult: Secondary | ICD-10-CM | POA: Diagnosis not present

## 2018-04-02 DIAGNOSIS — E559 Vitamin D deficiency, unspecified: Secondary | ICD-10-CM | POA: Diagnosis not present

## 2018-05-06 ENCOUNTER — Other Ambulatory Visit: Payer: Self-pay

## 2018-05-06 ENCOUNTER — Ambulatory Visit (INDEPENDENT_AMBULATORY_CARE_PROVIDER_SITE_OTHER): Payer: Medicare HMO | Admitting: *Deleted

## 2018-05-06 DIAGNOSIS — I4891 Unspecified atrial fibrillation: Secondary | ICD-10-CM | POA: Diagnosis not present

## 2018-05-06 DIAGNOSIS — Z5181 Encounter for therapeutic drug level monitoring: Secondary | ICD-10-CM

## 2018-05-06 LAB — POCT INR: INR: 1.8 — AB (ref 2.0–3.0)

## 2018-05-06 NOTE — Patient Instructions (Signed)
Take 1 and 1/2 tablets today, then increase dose to 1 tablet daily except 1/2 tablet on Sundays  Recheck in 6 weeks.

## 2018-06-17 ENCOUNTER — Other Ambulatory Visit: Payer: Self-pay

## 2018-06-17 ENCOUNTER — Telehealth: Payer: Self-pay | Admitting: *Deleted

## 2018-06-17 ENCOUNTER — Ambulatory Visit (INDEPENDENT_AMBULATORY_CARE_PROVIDER_SITE_OTHER): Payer: Medicare HMO | Admitting: *Deleted

## 2018-06-17 DIAGNOSIS — Z5181 Encounter for therapeutic drug level monitoring: Secondary | ICD-10-CM | POA: Diagnosis not present

## 2018-06-17 DIAGNOSIS — I4891 Unspecified atrial fibrillation: Secondary | ICD-10-CM | POA: Diagnosis not present

## 2018-06-17 LAB — POCT INR: INR: 2.1 (ref 2.0–3.0)

## 2018-06-17 NOTE — Patient Instructions (Signed)
Continue coumadin 1 tablet daily except 1/2 tablet on Sundays  Recheck in 6 weeks 

## 2018-06-17 NOTE — Telephone Encounter (Signed)
° °•   Do you currently have a fever? No    Have you recently travelled on a cruise, internationally, or to [place], [place], [place], [place], [place], or [place], [place] [employer]) ? No    Have you been in contact with someone that is currently pending confirmation of Covid19 testing or has been confirmed to have the [place] virus?  No   Are you currently experiencing fatigue or cough? no

## 2018-06-19 ENCOUNTER — Telehealth: Payer: Self-pay | Admitting: *Deleted

## 2018-06-19 NOTE — Telephone Encounter (Signed)
Patient verbally consented for telehealth visits today with [facility] and understands that her insurance company will be billed for the encounter. Will try to have vitals available if she can locate her equipment

## 2018-06-24 ENCOUNTER — Encounter: Payer: Self-pay | Admitting: Cardiovascular Disease

## 2018-06-24 ENCOUNTER — Telehealth (INDEPENDENT_AMBULATORY_CARE_PROVIDER_SITE_OTHER): Payer: Medicare HMO | Admitting: Cardiovascular Disease

## 2018-06-24 VITALS — Ht 64.0 in

## 2018-06-24 DIAGNOSIS — I429 Cardiomyopathy, unspecified: Secondary | ICD-10-CM

## 2018-06-24 DIAGNOSIS — I340 Nonrheumatic mitral (valve) insufficiency: Secondary | ICD-10-CM

## 2018-06-24 DIAGNOSIS — I10 Essential (primary) hypertension: Secondary | ICD-10-CM

## 2018-06-24 DIAGNOSIS — I4821 Permanent atrial fibrillation: Secondary | ICD-10-CM | POA: Diagnosis not present

## 2018-06-24 NOTE — Patient Instructions (Addendum)

## 2018-06-24 NOTE — Progress Notes (Signed)
Virtual Visit via Telephone Note   This visit type was conducted due to national recommendations for restrictions regarding the COVID-19 Pandemic (e.g. social distancing) in an effort to limit this patient's exposure and mitigate transmission in our community.  Due to her co-morbid illnesses, this patient is at least at moderate risk for complications without adequate follow up.  This format is felt to be most appropriate for this patient at this time.  The patient did not have access to video technology/had technical difficulties with video requiring transitioning to audio format only (telephone).  All issues noted in this document were discussed and addressed.  No physical exam could be performed with this format.  Please refer to the patient's chart for her  consent to telehealth for [facility].   Date:  06/24/2018   ID:  Lori David, DOB 1934-04-14, MRN [id number]  Patient Location: Home Provider Location: Office  PCP:  Glenda Chroman, MD  Cardiologist:  Dr. Bronson Ing Electrophysiologist:  None   Evaluation Performed:  Follow-Up Visit  Chief Complaint:  Atrial fibrillation  History of Present Illness:    Lori David is a 84 y.o. female with permanentatrial fibrillation and cardiomyopathy.EF 40-45% on 03/23/2014. A repeat echocardiogram on 02/15/14 demonstrated normalization of left ventricular systolic function with normal regional wall motion, LVEF 50-55%. There was moderate mitral and mild tricuspid regurgitation. There was severe left atrial dilatation.  She is doing well overall.  She denies chest pain and palpitations.  Tingling and numbness in the feet and the legs have improved with increase of vitamin D dose by her PCP.  She has episodic shortness of breath but this is less in frequency and is mild in severity. She has been spending the night at her sister and brother-in-law's house.  The patient does not have symptoms concerning for COVID-19 infection (fever,  chills, cough, or new shortness of breath).    SocHx: Her brother, Lori David, is also my patient.   Past Medical History:  Diagnosis Date  . Arthritis    "in my back/gastro dr" (03/22/2014)  . Atrial flutter ([place])    a. Pt reports episode 02/2013 noted pre-op cataracts but was in NSR at time of f/u. Saw cardiology and was told she had a leaky heart valve.  . Family history of adverse reaction to anesthesia    "some people in my family have; don't know what happened though" (03/22/2014)  . GERD (gastroesophageal reflux disease)   . GI problem    a. Pt reports workup for abdominal sensitivity with unremarkable endoscopy and CT.  [name] Heart valve disease    leaking heart valve  . Hypercholesterolemia   . Hypertension   . Osteopenia   . Seasonal allergies   . Valvular heart disease    Past Surgical History:  Procedure Laterality Date  . BACTERIAL OVERGROWTH TEST N/A 08/13/2014   Procedure: BACTERIAL OVERGROWTH TEST;  Surgeon: Rogene Houston, MD;  [facility];  Service: Endoscopy;  Laterality: N/A;  730  . CATARACT EXTRACTION W/ INTRAOCULAR LENS  IMPLANT, BILATERAL Bilateral 2015  . CATARACT EXTRACTION W/PHACO Right 06/30/2013   Procedure: CATARACT EXTRACTION PHACO AND INTRAOCULAR LENS PLACEMENT (IOC);  Surgeon: Elta Guadeloupe T. Gershon Crane, MD;  Location: [facility];  Service: Ophthalmology;  Laterality: Right;  CDE:7.99  . CATARACT EXTRACTION W/PHACO Left 07/14/2013   Procedure: CATARACT EXTRACTION PHACO AND INTRAOCULAR LENS PLACEMENT (IOC);  Surgeon: Elta Guadeloupe T. Gershon Crane, MD;  Location: [facility];  Service: Ophthalmology;  Laterality: Left;  CDE:  8.25  .  CHOLECYSTECTOMY  2000?   [facility]  . ESOPHAGOGASTRODUODENOSCOPY N/A 01/20/2014   Procedure: ESOPHAGOGASTRODUODENOSCOPY (EGD);  Surgeon: Rogene Houston, MD;  [facility];  Service: Endoscopy;  Laterality: N/A;  120 - moved to 12:25 - [name] to notify pt  . LAPAROSCOPIC CHOLECYSTECTOMY  ~ 2000  . TONSILLECTOMY  1943   [facility]-no longer present  . TONSILLECTOMY  ~ 1943     Current Meds  Medication Sig  . acetaminophen (TYLENOL) 650 MG CR tablet Take 650 mg by mouth every 8 (eight) hours as needed for pain.  . calcium carbonate (TUMS - DOSED IN MG ELEMENTAL CALCIUM) 500 MG chewable tablet Chew 1 tablet by mouth daily as needed for indigestion or heartburn.  . cyanocobalamin (,VITAMIN B-12,) 1000 MCG/ML injection Inject 1,000 mcg into the muscle every 30 (thirty) days.   . digoxin (LANOXIN) 0.125 MG tablet TAKE 1 TABLET ONCE DAILY.  [name] diltiazem (CARDIZEM CD) 120 MG 24 hr capsule TAKE 1 CAPSULE IN THE EVENING.  [name] diltiazem (TAZTIA XT) 240 MG 24 hr capsule Take 1 capsule (240 mg total) by mouth every morning.  . metFORMIN (GLUCOPHAGE) 500 MG tablet Take by mouth daily with breakfast.   . metoprolol tartrate (LOPRESSOR) 50 MG tablet TAKE (1) TABLET TWICE DAILY.  . pantoprazole (PROTONIX) 40 MG tablet TAKE (1) TABLET TWICE A DAY BEFORE MEALS.  [name] sennosides-docusate sodium (SENOKOT-S) 8.6-50 MG tablet Take 1 tablet by mouth 2 (two) times daily.  . Vitamin D, Ergocalciferol, (DRISDOL) 50000 UNITS CAPS capsule Take 50,000 Units by mouth every 7 (seven) days. Mondays  . warfarin (COUMADIN) 5 MG tablet Take 1 tablet daily except 1/2 tablet on Sundays and Thursdays     Allergies:   Chocolate; Chocolate; Peanuts [peanut oil]; Peanuts [peanut oil]; Penicillins; and Penicillins   Social History   Tobacco Use  . Smoking status: Former Smoker    Packs/day: 0.75    Years: 15.00    Pack years: 11.25    Types: Cigarettes    Last attempt to quit: 03/18/1974    Years since quitting: 44.2  . Smokeless tobacco: Never Used  . Tobacco comment: "quit smoking cigarettes in the 1970's"  Substance Use Topics  . Alcohol use: No    Alcohol/week: 0.0 standard drinks  . Drug use: No     Family Hx: The patient's family history includes Asthma in her unknown relative; Atrial fibrillation in her sister; CAD in her brother,  brother, father, and mother; Diabetes in her unknown relative; Emphysema in her father; Heart disease in her sister.  ROS:   Please see the history of present illness.     All other systems reviewed and are negative.   Prior CV studies:   The following studies were reviewed today:  Echo reviewed above  Labs/Other Tests and Data Reviewed:    EKG:  No ECG reviewed.  Recent Labs: No results found for requested labs within last 8760 hours.   Recent Lipid Panel Lab Results  Component Value Date/Time   CHOL 138 03/23/2014 04:42 AM   TRIG 99 03/23/2014 04:42 AM   HDL 40 03/23/2014 04:42 AM   CHOLHDL 3.5 03/23/2014 04:42 AM   LDLCALC 78 03/23/2014 04:42 AM    Wt Readings from Last 3 Encounters:  02/18/18 141 lb (64 kg)  02/05/17 150 lb (68 kg)  01/24/16 151 lb (68.5 kg)     Objective:    Vital Signs:  Ht 5\' 4"  (1.626 m)   BMI 24.20 kg/m  VITAL SIGNS:  reviewed  ASSESSMENT & PLAN:    1. Permanent atrial fibrillation: Symptomatically stable on diltiazem, metoprolol, and digoxin. Anticoagulated with warfarin. No changes to therapy. INR 2.1 on 06/17/18.  2. Nonischemic cardiomyopathy: Left ventricular systolic function normalized by echocardiogram in December 2017 as reviewed above. Continue present medical therapy.  3. Moderate mitral regurgitation: Stable.  4. Chronic hypertension: Continue to monitor. No changes.  COVID-19 Education: The signs and symptoms of COVID-19 were discussed with the patient and how to seek care for testing (follow up with PCP or arrange E-visit).  The importance of social distancing was discussed today.  Time:   Today, I have spent 15 minutes with the patient with telehealth technology discussing the above problems.     Medication Adjustments/Labs and Tests Ordered: Current medicines are reviewed at length with the patient today.  Concerns regarding medicines are outlined above.   Tests Ordered: No orders of the defined  types were placed in this encounter.   Medication Changes: No orders of the defined types were placed in this encounter.   Disposition:  Follow up in 6 month(s)  Signed, Kate Sable, MD  06/24/2018 11:16 AM    [place]

## 2018-07-08 DIAGNOSIS — I10 Essential (primary) hypertension: Secondary | ICD-10-CM | POA: Diagnosis not present

## 2018-07-08 DIAGNOSIS — I4892 Unspecified atrial flutter: Secondary | ICD-10-CM | POA: Diagnosis not present

## 2018-07-08 DIAGNOSIS — Z6824 Body mass index (BMI) 24.0-24.9, adult: Secondary | ICD-10-CM | POA: Diagnosis not present

## 2018-07-08 DIAGNOSIS — E1165 Type 2 diabetes mellitus with hyperglycemia: Secondary | ICD-10-CM | POA: Diagnosis not present

## 2018-07-08 DIAGNOSIS — I471 Supraventricular tachycardia: Secondary | ICD-10-CM | POA: Diagnosis not present

## 2018-07-08 DIAGNOSIS — Z299 Encounter for prophylactic measures, unspecified: Secondary | ICD-10-CM | POA: Diagnosis not present

## 2018-07-16 ENCOUNTER — Other Ambulatory Visit: Payer: Self-pay | Admitting: Cardiovascular Disease

## 2018-07-31 ENCOUNTER — Ambulatory Visit (INDEPENDENT_AMBULATORY_CARE_PROVIDER_SITE_OTHER): Payer: Medicare HMO | Admitting: *Deleted

## 2018-07-31 DIAGNOSIS — Z5181 Encounter for therapeutic drug level monitoring: Secondary | ICD-10-CM | POA: Diagnosis not present

## 2018-07-31 DIAGNOSIS — I4891 Unspecified atrial fibrillation: Secondary | ICD-10-CM

## 2018-07-31 LAB — POCT INR: INR: 2.7 (ref 2.0–3.0)

## 2018-07-31 NOTE — Patient Instructions (Signed)
Continue coumadin 1 tablet daily except 1/2 tablet on Sundays  Recheck in 6 weeks 

## 2018-08-19 ENCOUNTER — Other Ambulatory Visit: Payer: Self-pay | Admitting: Cardiovascular Disease

## 2018-08-27 DIAGNOSIS — Z1211 Encounter for screening for malignant neoplasm of colon: Secondary | ICD-10-CM | POA: Diagnosis not present

## 2018-08-27 DIAGNOSIS — Z1331 Encounter for screening for depression: Secondary | ICD-10-CM | POA: Diagnosis not present

## 2018-08-27 DIAGNOSIS — I471 Supraventricular tachycardia: Secondary | ICD-10-CM | POA: Diagnosis not present

## 2018-08-27 DIAGNOSIS — Z0000 Encounter for general adult medical examination without abnormal findings: Secondary | ICD-10-CM | POA: Diagnosis not present

## 2018-08-27 DIAGNOSIS — I10 Essential (primary) hypertension: Secondary | ICD-10-CM | POA: Diagnosis not present

## 2018-08-27 DIAGNOSIS — Z299 Encounter for prophylactic measures, unspecified: Secondary | ICD-10-CM | POA: Diagnosis not present

## 2018-08-27 DIAGNOSIS — Z6823 Body mass index (BMI) 23.0-23.9, adult: Secondary | ICD-10-CM | POA: Diagnosis not present

## 2018-08-27 DIAGNOSIS — Z1339 Encounter for screening examination for other mental health and behavioral disorders: Secondary | ICD-10-CM | POA: Diagnosis not present

## 2018-08-27 DIAGNOSIS — Z7189 Other specified counseling: Secondary | ICD-10-CM | POA: Diagnosis not present

## 2018-08-27 DIAGNOSIS — E559 Vitamin D deficiency, unspecified: Secondary | ICD-10-CM | POA: Diagnosis not present

## 2018-08-27 DIAGNOSIS — Z79899 Other long term (current) drug therapy: Secondary | ICD-10-CM | POA: Diagnosis not present

## 2018-08-27 DIAGNOSIS — R5383 Other fatigue: Secondary | ICD-10-CM | POA: Diagnosis not present

## 2018-09-16 ENCOUNTER — Ambulatory Visit (INDEPENDENT_AMBULATORY_CARE_PROVIDER_SITE_OTHER): Payer: Medicare HMO | Admitting: *Deleted

## 2018-09-16 DIAGNOSIS — I4891 Unspecified atrial fibrillation: Secondary | ICD-10-CM

## 2018-09-16 DIAGNOSIS — Z5181 Encounter for therapeutic drug level monitoring: Secondary | ICD-10-CM | POA: Diagnosis not present

## 2018-09-16 LAB — POCT INR: INR: 2.2 (ref 2.0–3.0)

## 2018-09-16 NOTE — Patient Instructions (Signed)
Continue coumadin 1 tablet daily except 1/2 tablet on Sundays  Recheck in 6 weeks 

## 2018-09-23 ENCOUNTER — Other Ambulatory Visit: Payer: Self-pay | Admitting: Cardiovascular Disease

## 2018-10-14 DIAGNOSIS — I10 Essential (primary) hypertension: Secondary | ICD-10-CM | POA: Diagnosis not present

## 2018-10-14 DIAGNOSIS — Z299 Encounter for prophylactic measures, unspecified: Secondary | ICD-10-CM | POA: Diagnosis not present

## 2018-10-14 DIAGNOSIS — I471 Supraventricular tachycardia: Secondary | ICD-10-CM | POA: Diagnosis not present

## 2018-10-14 DIAGNOSIS — E1165 Type 2 diabetes mellitus with hyperglycemia: Secondary | ICD-10-CM | POA: Diagnosis not present

## 2018-10-14 DIAGNOSIS — I4892 Unspecified atrial flutter: Secondary | ICD-10-CM | POA: Diagnosis not present

## 2018-10-14 DIAGNOSIS — Z6824 Body mass index (BMI) 24.0-24.9, adult: Secondary | ICD-10-CM | POA: Diagnosis not present

## 2018-11-19 ENCOUNTER — Other Ambulatory Visit: Payer: Self-pay

## 2018-11-19 ENCOUNTER — Ambulatory Visit (INDEPENDENT_AMBULATORY_CARE_PROVIDER_SITE_OTHER): Payer: Medicare HMO | Admitting: *Deleted

## 2018-11-19 DIAGNOSIS — Z5181 Encounter for therapeutic drug level monitoring: Secondary | ICD-10-CM | POA: Diagnosis not present

## 2018-11-19 DIAGNOSIS — I4891 Unspecified atrial fibrillation: Secondary | ICD-10-CM | POA: Diagnosis not present

## 2018-11-19 LAB — POCT INR: INR: 2.8 (ref 2.0–3.0)

## 2018-11-19 NOTE — Patient Instructions (Signed)
Continue coumadin 1 tablet daily except 1/2 tablet on Sundays  Recheck in 6 weeks 

## 2018-11-27 ENCOUNTER — Other Ambulatory Visit: Payer: Self-pay

## 2018-11-27 DIAGNOSIS — Z20822 Contact with and (suspected) exposure to covid-19: Secondary | ICD-10-CM

## 2018-11-27 DIAGNOSIS — Z20828 Contact with and (suspected) exposure to other viral communicable diseases: Secondary | ICD-10-CM | POA: Diagnosis not present

## 2018-11-28 LAB — NOVEL CORONAVIRUS, NAA: SARS-CoV-2, NAA: NOT DETECTED

## 2018-12-01 DIAGNOSIS — Z20828 Contact with and (suspected) exposure to other viral communicable diseases: Secondary | ICD-10-CM | POA: Diagnosis not present

## 2018-12-01 DIAGNOSIS — I10 Essential (primary) hypertension: Secondary | ICD-10-CM | POA: Diagnosis not present

## 2018-12-01 DIAGNOSIS — Z299 Encounter for prophylactic measures, unspecified: Secondary | ICD-10-CM | POA: Diagnosis not present

## 2018-12-01 DIAGNOSIS — Z6824 Body mass index (BMI) 24.0-24.9, adult: Secondary | ICD-10-CM | POA: Diagnosis not present

## 2018-12-01 DIAGNOSIS — Z713 Dietary counseling and surveillance: Secondary | ICD-10-CM | POA: Diagnosis not present

## 2018-12-11 ENCOUNTER — Telehealth: Payer: Self-pay | Admitting: Cardiovascular Disease

## 2018-12-11 NOTE — Telephone Encounter (Signed)
Virtual Visit Pre-Appointment Phone Call  "(Name), I am calling you today to discuss your upcoming appointment. We are currently trying to limit exposure to the virus that causes COVID-19 by seeing patients at home rather than in the office."  1. "What is the BEST phone number to call the day of the visit?" - include this in appointment notes  2. Do you have or have access to (through a family member/friend) a smartphone with video capability that we can use for your visit?" a. If yes - list this number in appt notes as cell (if different from BEST phone #) and list the appointment type as a VIDEO visit in appointment notes b. If no - list the appointment type as a PHONE visit in appointment notes  3. Confirm consent - "In the setting of the current Covid19 crisis, you are scheduled for a (phone or video) visit with your provider on (date) at (time).  Just as we do with many in-office visits, in order for you to participate in this visit, we must obtain consent.  If you'd like, I can send this to your mychart (if signed up) or email for you to review.  Otherwise, I can obtain your verbal consent now.  All virtual visits are billed to your insurance company just like a normal visit would be.  By agreeing to a virtual visit, we'd like you to understand that the technology does not allow for your provider to perform an examination, and thus may limit your provider's ability to fully assess your condition. If your provider identifies any concerns that need to be evaluated in person, we will make arrangements to do so.  Finally, though the technology is pretty good, we cannot assure that it will always work on either your or our end, and in the setting of a video visit, we may have to convert it to a phone-only visit.  In either situation, we cannot ensure that we have a secure connection.  Are you willing to proceed?" STAFF: Did the patient verbally acknowledge consent to telehealth visit? Document  YES/NO here: yes  4. Advise patient to be prepared - "Two hours prior to your appointment, go ahead and check your blood pressure, pulse, oxygen saturation, and your weight (if you have the equipment to check those) and write them all down. When your visit starts, your provider will ask you for this information. If you have an Apple Watch or Kardia device, please plan to have heart rate information ready on the day of your appointment. Please have a pen and paper handy nearby the day of the visit as well."  5. Give patient instructions for MyChart download to smartphone OR Doximity/Doxy.me as below if video visit (depending on what platform provider is using)  6. Inform patient they will receive a phone call 15 minutes prior to their appointment time (may be from unknown caller ID) so they should be prepared to answer    TELEPHONE CALL NOTE  Will Schmeltzer has been deemed a candidate for a follow-up tele-health visit to limit community exposure during the Covid-19 pandemic. I spoke with the patient via phone to ensure availability of phone/video source, confirm preferred email & phone number, and discuss instructions and expectations.  I reminded Lori David to be prepared with any vital sign and/or heart rhythm information that could potentially be obtained via home monitoring, at the time of her visit. I reminded Lori David to expect a phone call prior to her visit.  Lori David 12/11/2018 11:11 AM   INSTRUCTIONS FOR DOWNLOADING THE MYCHART APP TO SMARTPHONE  - The patient must first make sure to have activated MyChart and know their login information - If Apple, go to [employer] and type in MyChart in the search bar and download the app. If Android, ask patient to go to [employer] and type in [place] in the search bar and download the app. The app is free but as with any other app downloads, their phone may require them to verify saved payment information or Apple/Android  password.  - The patient will need to then log into the app with their MyChart username and password, and select [facility] as their healthcare provider to link the account. When it is time for your visit, go to the MyChart app, find appointments, and click Begin Video Visit. Be sure to Select Allow for your device to access the Microphone and Camera for your visit. You will then be connected, and your provider will be with you shortly.  **If they have any issues connecting, or need assistance please contact MyChart service desk [phone])**  **If using a computer, in order to ensure the best quality for their visit they will need to use either of the following Internet Browsers: [employer], or Google Chrome**  IF USING DOXIMITY or DOXY.ME - The patient will receive a link just prior to their visit by text.     FULL LENGTH CONSENT FOR TELE-HEALTH VISIT   I hereby voluntarily request, consent and authorize [place] and its employed or contracted physicians, physician assistants, nurse practitioners or other licensed health care professionals (the Practitioner), to provide me with telemedicine health care services (the Services") as deemed necessary by the treating Practitioner. I acknowledge and consent to receive the Services by the Practitioner via telemedicine. I understand that the telemedicine visit will involve communicating with the Practitioner through live audiovisual communication technology and the disclosure of certain medical information by electronic transmission. I acknowledge that I have been given the opportunity to request an in-person assessment or other available alternative prior to the telemedicine visit and am voluntarily participating in the telemedicine visit.  I understand that I have the right to withhold or withdraw my consent to the use of telemedicine in the course of my care at any time, without affecting my right to future care or treatment,  and that the Practitioner or I may terminate the telemedicine visit at any time. I understand that I have the right to inspect all information obtained and/or recorded in the course of the telemedicine visit and may receive copies of available information for a reasonable fee.  I understand that some of the potential risks of receiving the Services via telemedicine include:   Delay or interruption in medical evaluation due to technological equipment failure or disruption;  Information transmitted may not be sufficient (e.g. poor resolution of images) to allow for appropriate medical decision making by the Practitioner; and/or   In rare instances, security protocols could fail, causing a breach of personal health information.  Furthermore, I acknowledge that it is my responsibility to provide information about my medical history, conditions and care that is complete and accurate to the best of my ability. I acknowledge that Practitioner's advice, recommendations, and/or decision may be based on factors not within their control, such as incomplete or inaccurate data provided by me or distortions of diagnostic images or specimens that may result from electronic transmissions. I understand that the  practice of medicine is not an Chief Strategy Officer and that Practitioner makes no warranties or guarantees regarding treatment outcomes. I acknowledge that I will receive a copy of this consent concurrently upon execution via email to the email address I last provided but may also request a printed copy by calling the office of [place].    I understand that my insurance will be billed for this visit.   I have read or had this consent read to me.  I understand the contents of this consent, which adequately explains the benefits and risks of the Services being provided via telemedicine.   I have been provided ample opportunity to ask questions regarding this consent and the Services and have had my questions  answered to my satisfaction.  I give my informed consent for the services to be provided through the use of telemedicine in my medical care  By participating in this telemedicine visit I agree to the above.

## 2018-12-12 ENCOUNTER — Other Ambulatory Visit: Payer: Self-pay | Admitting: *Deleted

## 2018-12-12 DIAGNOSIS — Z20822 Contact with and (suspected) exposure to covid-19: Secondary | ICD-10-CM

## 2018-12-14 LAB — NOVEL CORONAVIRUS, NAA: SARS-CoV-2, NAA: DETECTED — AB

## 2018-12-15 ENCOUNTER — Telehealth: Payer: Self-pay | Admitting: *Deleted

## 2018-12-15 NOTE — Telephone Encounter (Signed)
See pt given result of COVID test; see result note dated 12/15/2018;

## 2018-12-16 DIAGNOSIS — I471 Supraventricular tachycardia: Secondary | ICD-10-CM | POA: Diagnosis not present

## 2018-12-16 DIAGNOSIS — E1165 Type 2 diabetes mellitus with hyperglycemia: Secondary | ICD-10-CM | POA: Diagnosis not present

## 2018-12-16 DIAGNOSIS — U071 COVID-19: Secondary | ICD-10-CM | POA: Diagnosis not present

## 2018-12-16 DIAGNOSIS — I4892 Unspecified atrial flutter: Secondary | ICD-10-CM | POA: Diagnosis not present

## 2018-12-16 DIAGNOSIS — Z299 Encounter for prophylactic measures, unspecified: Secondary | ICD-10-CM | POA: Diagnosis not present

## 2018-12-23 DIAGNOSIS — I471 Supraventricular tachycardia: Secondary | ICD-10-CM | POA: Diagnosis not present

## 2018-12-23 DIAGNOSIS — Z299 Encounter for prophylactic measures, unspecified: Secondary | ICD-10-CM | POA: Diagnosis not present

## 2018-12-23 DIAGNOSIS — U071 COVID-19: Secondary | ICD-10-CM | POA: Diagnosis not present

## 2018-12-23 DIAGNOSIS — I4892 Unspecified atrial flutter: Secondary | ICD-10-CM | POA: Diagnosis not present

## 2018-12-23 DIAGNOSIS — E1165 Type 2 diabetes mellitus with hyperglycemia: Secondary | ICD-10-CM | POA: Diagnosis not present

## 2019-01-12 ENCOUNTER — Other Ambulatory Visit: Payer: Self-pay | Admitting: *Deleted

## 2019-01-12 MED ORDER — DIGOXIN 125 MCG PO TABS: 125.0000 ug | ORAL_TABLET | Freq: Every day | ORAL | 0 refills | Status: DC

## 2019-01-12 MED ORDER — DILTIAZEM HCL ER COATED BEADS 120 MG PO CP24: ORAL_CAPSULE | ORAL | 0 refills | Status: DC

## 2019-01-12 MED ORDER — DILTIAZEM HCL ER COATED BEADS 240 MG PO CP24: 240.0000 mg | ORAL_CAPSULE | Freq: Every morning | ORAL | 0 refills | Status: DC

## 2019-01-20 ENCOUNTER — Encounter: Payer: Self-pay | Admitting: *Deleted

## 2019-01-20 DIAGNOSIS — E1165 Type 2 diabetes mellitus with hyperglycemia: Secondary | ICD-10-CM | POA: Diagnosis not present

## 2019-01-20 DIAGNOSIS — I10 Essential (primary) hypertension: Secondary | ICD-10-CM | POA: Diagnosis not present

## 2019-01-20 DIAGNOSIS — Z713 Dietary counseling and surveillance: Secondary | ICD-10-CM | POA: Diagnosis not present

## 2019-01-20 DIAGNOSIS — Z299 Encounter for prophylactic measures, unspecified: Secondary | ICD-10-CM | POA: Diagnosis not present

## 2019-01-20 DIAGNOSIS — Z6824 Body mass index (BMI) 24.0-24.9, adult: Secondary | ICD-10-CM | POA: Diagnosis not present

## 2019-01-21 ENCOUNTER — Encounter: Payer: Self-pay | Admitting: Cardiovascular Disease

## 2019-01-21 ENCOUNTER — Encounter: Payer: Self-pay | Admitting: *Deleted

## 2019-01-21 ENCOUNTER — Telehealth (INDEPENDENT_AMBULATORY_CARE_PROVIDER_SITE_OTHER): Payer: Medicare HMO | Admitting: Cardiovascular Disease

## 2019-01-21 VITALS — Ht 64.0 in | Wt 140.0 lb

## 2019-01-21 DIAGNOSIS — I340 Nonrheumatic mitral (valve) insufficiency: Secondary | ICD-10-CM

## 2019-01-21 DIAGNOSIS — I10 Essential (primary) hypertension: Secondary | ICD-10-CM

## 2019-01-21 DIAGNOSIS — I4821 Permanent atrial fibrillation: Secondary | ICD-10-CM | POA: Diagnosis not present

## 2019-01-21 DIAGNOSIS — Z7901 Long term (current) use of anticoagulants: Secondary | ICD-10-CM

## 2019-01-21 NOTE — Patient Instructions (Addendum)

## 2019-01-21 NOTE — Progress Notes (Signed)
Virtual Visit via Telephone Note   This visit type was conducted due to national recommendations for restrictions regarding the COVID-19 Pandemic (e.g. social distancing) in an effort to limit this patient's exposure and mitigate transmission in our community.  Due to her co-morbid illnesses, this patient is at least at moderate risk for complications without adequate follow up.  This format is felt to be most appropriate for this patient at this time.  The patient did not have access to video technology/had technical difficulties with video requiring transitioning to audio format only (telephone).  All issues noted in this document were discussed and addressed.  No physical exam could be performed with this format.  Please refer to the patient's chart for her  consent to telehealth for [facility].   Date:  01/21/2019   ID:  Lori David, DOB 21-Oct-1934, MRN [id number]  Patient Location: Home Provider Location: Office  PCP:  Glenda Chroman, MD  Cardiologist:  Kate Sable, MD  Electrophysiologist:  None   Evaluation Performed:  Follow-Up Visit  Chief Complaint:  Atrial fibrillation  History of Present Illness:    Lori David is a 84 y.o. female with permanentatrial fibrillation and cardiomyopathy.EF 40-45% on 03/23/2014. A repeat echocardiogram on 02/15/14 demonstrated normalization of left ventricular systolic function with normal regional wall motion, LVEF 50-55%. There was moderate mitral and mild tricuspid regurgitation. There was severe left atrial dilatation.  She recently spent 2 weeks in the [place] area with her brother, Lori David, who is also my patient.  She denies chest pain. She seldom has shortness of breath. She takes Tums for GERD which is seldom.  She tested positive for COVID in October but was asymptomatic.  She has some mild swelling in her feet with some tingling.   Past Medical History:  Diagnosis Date  . Arthritis    "in my back/gastro dr"  (03/22/2014)  . Atrial flutter ([place])    a. Pt reports episode 02/2013 noted pre-op cataracts but was in NSR at time of f/u. Saw cardiology and was told she had a leaky heart valve.  . Family history of adverse reaction to anesthesia    "some people in my family have; don't know what happened though" (03/22/2014)  . GERD (gastroesophageal reflux disease)   . GI problem    a. Pt reports workup for abdominal sensitivity with unremarkable endoscopy and CT.  [name] Heart valve disease    leaking heart valve  . Hypercholesterolemia   . Hypertension   . Osteopenia   . Seasonal allergies   . Valvular heart disease    Past Surgical History:  Procedure Laterality Date  . BACTERIAL OVERGROWTH TEST N/A 08/13/2014   Procedure: BACTERIAL OVERGROWTH TEST;  Surgeon: Rogene Houston, MD;  [facility];  Service: Endoscopy;  Laterality: N/A;  730  . CATARACT EXTRACTION W/ INTRAOCULAR LENS  IMPLANT, BILATERAL Bilateral 2015  . CATARACT EXTRACTION W/PHACO Right 06/30/2013   Procedure: CATARACT EXTRACTION PHACO AND INTRAOCULAR LENS PLACEMENT (IOC);  Surgeon: Elta Guadeloupe T. Gershon Crane, MD;  Location: [facility];  Service: Ophthalmology;  Laterality: Right;  CDE:7.99  . CATARACT EXTRACTION W/PHACO Left 07/14/2013   Procedure: CATARACT EXTRACTION PHACO AND INTRAOCULAR LENS PLACEMENT (IOC);  Surgeon: Elta Guadeloupe T. Gershon Crane, MD;  Location: [facility];  Service: Ophthalmology;  Laterality: Left;  CDE:  8.25  . CHOLECYSTECTOMY  2000?   [facility]  . ESOPHAGOGASTRODUODENOSCOPY N/A 01/20/2014   Procedure: ESOPHAGOGASTRODUODENOSCOPY (EGD);  Surgeon: Rogene Houston, MD;  [facility];  Service:  Endoscopy;  Laterality: N/A;  120 - moved to 12:25 - [name] to notify pt  . LAPAROSCOPIC CHOLECYSTECTOMY  ~ 2000  . TONSILLECTOMY  1943   [facility]-no longer present  . TONSILLECTOMY  ~ 1943     Current Meds  Medication Sig  . acetaminophen (TYLENOL) 650 MG CR tablet Take 650 mg by mouth every 8 (eight) hours as needed for pain.   . calcium carbonate (TUMS - DOSED IN MG ELEMENTAL CALCIUM) 500 MG chewable tablet Chew 1 tablet by mouth daily as needed for indigestion or heartburn.  . cyanocobalamin (,VITAMIN B-12,) 1000 MCG/ML injection Inject 1,000 mcg into the muscle every 30 (thirty) days.   . digoxin (LANOXIN) 0.125 MG tablet Take 1 tablet (125 mcg total) by mouth daily.  [name] diltiazem (CARDIZEM CD) 120 MG 24 hr capsule TAKE 1 CAPSULE IN THE EVENING.  [name] diltiazem (CARDIZEM CD) 240 MG 24 hr capsule Take 1 capsule (240 mg total) by mouth every morning.  . metFORMIN (GLUCOPHAGE) 500 MG tablet Take 250 mg by mouth daily with breakfast.   . metoprolol tartrate (LOPRESSOR) 50 MG tablet TAKE (1) TABLET TWICE DAILY.  . pantoprazole (PROTONIX) 40 MG tablet TAKE (1) TABLET TWICE A DAY BEFORE MEALS.  [name] sennosides-docusate sodium (SENOKOT-S) 8.6-50 MG tablet Take 1 tablet by mouth 2 (two) times daily.  . Vitamin D, Ergocalciferol, (DRISDOL) 50000 UNITS CAPS capsule Take 50,000 Units by mouth every 7 (seven) days. Mondays  . warfarin (COUMADIN) 5 MG tablet TAKE 1 TABLET DAILY EXCEPT TAKE 1/2 TABLET ON SUNDAYS     Allergies:   Chocolate, Chocolate, Peanuts [peanut oil], Peanuts [peanut oil], Penicillins, and Penicillins   Social History   Tobacco Use  . Smoking status: Former Smoker    Packs/day: 0.75    Years: 15.00    Pack years: 11.25    Types: Cigarettes    Quit date: 03/18/1974    Years since quitting: 44.8  . Smokeless tobacco: Never Used  . Tobacco comment: "quit smoking cigarettes in the 1970's"  Substance Use Topics  . Alcohol use: No    Alcohol/week: 0.0 standard drinks  . Drug use: No     Family Hx: The patient's family history includes Asthma in an other family member; Atrial fibrillation in her sister; CAD in her brother, brother, father, and mother; Diabetes in an other family member; Emphysema in her father; Heart disease in her sister.  ROS:   Please see the history of present illness.     All other  systems reviewed and are negative.   Prior CV studies:   The following studies were reviewed today:  Echocardiogram reviewed above  Labs/Other Tests and Data Reviewed:    EKG:  No ECG reviewed.  Recent Labs: No results found for requested labs within last 8760 hours.   Recent Lipid Panel Lab Results  Component Value Date/Time   CHOL 138 03/23/2014 04:42 AM   TRIG 99 03/23/2014 04:42 AM   HDL 40 03/23/2014 04:42 AM   CHOLHDL 3.5 03/23/2014 04:42 AM   LDLCALC 78 03/23/2014 04:42 AM    Wt Readings from Last 3 Encounters:  01/21/19 140 lb (63.5 kg)  02/18/18 141 lb (64 kg)  02/05/17 150 lb (68 kg)     Objective:    Vital Signs:  Ht 5\' 4"  (1.626 m)   Wt 140 lb (63.5 kg)   BMI 24.03 kg/m    VITAL SIGNS:  reviewed  ASSESSMENT & PLAN:    1. Permanent atrial fibrillation:  Symptomatically stable on diltiazem, metoprolol and digoxin. Anticoagulated with warfarin. No changes to therapy.  2. Nonischemic cardiomyopathy: Left ventricular systolic function normalized by echocardiogram in December 2017 as reviewed above. Continue present medical therapy.  3. Moderate mitral regurgitation: Stable.  4. Hypertension: BP was normal at PCP's office yesterday. No changes.   COVID-19 Education: The signs and symptoms of COVID-19 were discussed with the patient and how to seek care for testing (follow up with PCP or arrange E-visit).  The importance of social distancing was discussed today.  Time:   Today, I have spent 10 minutes with the patient with telehealth technology discussing the above problems.     Medication Adjustments/Labs and Tests Ordered: Current medicines are reviewed at length with the patient today.  Concerns regarding medicines are outlined above.   Tests Ordered: No orders of the defined types were placed in this encounter.   Medication Changes: No orders of the defined types were placed in this encounter.   Follow Up:  Either In Person or  Virtual in 6 month(s)  Signed, Kate Sable, MD  01/21/2019 12:55 PM    [place]

## 2019-01-22 ENCOUNTER — Other Ambulatory Visit: Payer: Self-pay

## 2019-01-22 ENCOUNTER — Ambulatory Visit (INDEPENDENT_AMBULATORY_CARE_PROVIDER_SITE_OTHER): Payer: Medicare HMO | Admitting: *Deleted

## 2019-01-22 DIAGNOSIS — Z5181 Encounter for therapeutic drug level monitoring: Secondary | ICD-10-CM

## 2019-01-22 DIAGNOSIS — I4891 Unspecified atrial fibrillation: Secondary | ICD-10-CM

## 2019-01-22 LAB — POCT INR: INR: 2.7 (ref 2.0–3.0)

## 2019-01-22 NOTE — Patient Instructions (Signed)
Continue coumadin 1 tablet daily except 1/2 tablet on Sundays  Recheck in 6 weeks 

## 2019-01-29 ENCOUNTER — Other Ambulatory Visit (INDEPENDENT_AMBULATORY_CARE_PROVIDER_SITE_OTHER): Payer: Self-pay | Admitting: Internal Medicine

## 2019-01-29 ENCOUNTER — Other Ambulatory Visit: Payer: Self-pay | Admitting: Cardiovascular Disease

## 2019-02-17 ENCOUNTER — Other Ambulatory Visit: Payer: Self-pay | Admitting: Cardiovascular Disease

## 2019-02-18 ENCOUNTER — Other Ambulatory Visit: Payer: Self-pay | Admitting: Cardiovascular Disease

## 2019-03-04 ENCOUNTER — Ambulatory Visit (INDEPENDENT_AMBULATORY_CARE_PROVIDER_SITE_OTHER): Payer: Medicare HMO | Admitting: *Deleted

## 2019-03-04 ENCOUNTER — Other Ambulatory Visit: Payer: Self-pay

## 2019-03-04 DIAGNOSIS — Z5181 Encounter for therapeutic drug level monitoring: Secondary | ICD-10-CM | POA: Diagnosis not present

## 2019-03-04 DIAGNOSIS — I4891 Unspecified atrial fibrillation: Secondary | ICD-10-CM | POA: Diagnosis not present

## 2019-03-04 LAB — POCT INR: INR: 2.2 (ref 2.0–3.0)

## 2019-03-04 NOTE — Patient Instructions (Signed)
Continue coumadin 1 tablet daily except 1/2 tablet on Sundays  Recheck in 6 weeks 

## 2019-03-06 ENCOUNTER — Ambulatory Visit: Payer: Medicare HMO | Attending: Internal Medicine

## 2019-03-06 ENCOUNTER — Other Ambulatory Visit: Payer: Self-pay

## 2019-03-06 DIAGNOSIS — Z20822 Contact with and (suspected) exposure to covid-19: Secondary | ICD-10-CM

## 2019-03-07 LAB — NOVEL CORONAVIRUS, NAA: SARS-CoV-2, NAA: NOT DETECTED

## 2019-03-11 ENCOUNTER — Telehealth: Payer: Self-pay

## 2019-03-11 NOTE — Telephone Encounter (Signed)
Pt notified of negative COVID-19 results. Understanding verbalized.  Lori David   

## 2019-03-31 ENCOUNTER — Other Ambulatory Visit: Payer: Self-pay | Admitting: Cardiovascular Disease

## 2019-04-15 ENCOUNTER — Other Ambulatory Visit: Payer: Self-pay

## 2019-04-15 ENCOUNTER — Ambulatory Visit (INDEPENDENT_AMBULATORY_CARE_PROVIDER_SITE_OTHER): Payer: Medicare HMO | Admitting: *Deleted

## 2019-04-15 DIAGNOSIS — Z5181 Encounter for therapeutic drug level monitoring: Secondary | ICD-10-CM | POA: Diagnosis not present

## 2019-04-15 DIAGNOSIS — I4891 Unspecified atrial fibrillation: Secondary | ICD-10-CM | POA: Diagnosis not present

## 2019-04-15 LAB — POCT INR: INR: 2.3 (ref 2.0–3.0)

## 2019-04-15 NOTE — Patient Instructions (Signed)
Continue coumadin 1 tablet daily except 1/2 tablet on Sundays  Recheck in 6 weeks 

## 2019-04-28 DIAGNOSIS — Z2821 Immunization not carried out because of patient refusal: Secondary | ICD-10-CM | POA: Diagnosis not present

## 2019-04-28 DIAGNOSIS — E1165 Type 2 diabetes mellitus with hyperglycemia: Secondary | ICD-10-CM | POA: Diagnosis not present

## 2019-04-28 DIAGNOSIS — I4891 Unspecified atrial fibrillation: Secondary | ICD-10-CM | POA: Diagnosis not present

## 2019-04-28 DIAGNOSIS — R809 Proteinuria, unspecified: Secondary | ICD-10-CM | POA: Diagnosis not present

## 2019-04-28 DIAGNOSIS — Z299 Encounter for prophylactic measures, unspecified: Secondary | ICD-10-CM | POA: Diagnosis not present

## 2019-04-28 DIAGNOSIS — E119 Type 2 diabetes mellitus without complications: Secondary | ICD-10-CM | POA: Diagnosis not present

## 2019-04-28 DIAGNOSIS — E1129 Type 2 diabetes mellitus with other diabetic kidney complication: Secondary | ICD-10-CM | POA: Diagnosis not present

## 2019-04-28 DIAGNOSIS — I10 Essential (primary) hypertension: Secondary | ICD-10-CM | POA: Diagnosis not present

## 2019-06-30 ENCOUNTER — Other Ambulatory Visit: Payer: Self-pay

## 2019-06-30 ENCOUNTER — Ambulatory Visit (INDEPENDENT_AMBULATORY_CARE_PROVIDER_SITE_OTHER): Payer: Medicare HMO | Admitting: *Deleted

## 2019-06-30 DIAGNOSIS — Z5181 Encounter for therapeutic drug level monitoring: Secondary | ICD-10-CM

## 2019-06-30 DIAGNOSIS — I4891 Unspecified atrial fibrillation: Secondary | ICD-10-CM

## 2019-06-30 LAB — POCT INR: INR: 3.1 — AB (ref 2.0–3.0)

## 2019-06-30 NOTE — Patient Instructions (Signed)
Take warfarin 1/2 tablet tonight then resume 1 tablet daily except 1/2 tablet on Sundays  Recheck in 6 weeks.

## 2019-07-22 ENCOUNTER — Other Ambulatory Visit (INDEPENDENT_AMBULATORY_CARE_PROVIDER_SITE_OTHER): Payer: Self-pay | Admitting: Internal Medicine

## 2019-07-24 NOTE — Telephone Encounter (Signed)
Patient last seen 4 years ago. Needs OV prior to additional refills.

## 2019-08-05 ENCOUNTER — Other Ambulatory Visit: Payer: Self-pay | Admitting: *Deleted

## 2019-08-05 MED ORDER — DILTIAZEM HCL ER COATED BEADS 240 MG PO CP24: 240.0000 mg | ORAL_CAPSULE | Freq: Every morning | ORAL | 1 refills | Status: DC

## 2019-08-05 MED ORDER — DILTIAZEM HCL ER COATED BEADS 120 MG PO CP24: 120.0000 mg | ORAL_CAPSULE | Freq: Every evening | ORAL | 1 refills | Status: DC

## 2019-08-06 DIAGNOSIS — E1165 Type 2 diabetes mellitus with hyperglycemia: Secondary | ICD-10-CM | POA: Diagnosis not present

## 2019-08-06 DIAGNOSIS — I4892 Unspecified atrial flutter: Secondary | ICD-10-CM | POA: Diagnosis not present

## 2019-08-06 DIAGNOSIS — Z299 Encounter for prophylactic measures, unspecified: Secondary | ICD-10-CM | POA: Diagnosis not present

## 2019-08-06 DIAGNOSIS — I10 Essential (primary) hypertension: Secondary | ICD-10-CM | POA: Diagnosis not present

## 2019-08-06 DIAGNOSIS — I4891 Unspecified atrial fibrillation: Secondary | ICD-10-CM | POA: Diagnosis not present

## 2019-08-12 ENCOUNTER — Other Ambulatory Visit: Payer: Self-pay | Admitting: Cardiovascular Disease

## 2019-08-12 ENCOUNTER — Telehealth: Payer: Medicare HMO | Admitting: Cardiovascular Disease

## 2019-08-12 ENCOUNTER — Other Ambulatory Visit: Payer: Self-pay

## 2019-08-12 ENCOUNTER — Ambulatory Visit (INDEPENDENT_AMBULATORY_CARE_PROVIDER_SITE_OTHER): Payer: Medicare HMO | Admitting: *Deleted

## 2019-08-12 DIAGNOSIS — Z5181 Encounter for therapeutic drug level monitoring: Secondary | ICD-10-CM

## 2019-08-12 DIAGNOSIS — I4891 Unspecified atrial fibrillation: Secondary | ICD-10-CM

## 2019-08-12 LAB — POCT INR: INR: 2.5 (ref 2.0–3.0)

## 2019-08-12 NOTE — Telephone Encounter (Signed)
    1. Which medications need to be refilled? (please list name of each medication and dose if known)  Cardizem 240 mg   2. Which pharmacy/location (including street and city if local pharmacy) is medication to be sent to? [facility]   3. Do they need a 30 day or 90 day supply?  [place]

## 2019-08-12 NOTE — Telephone Encounter (Signed)
Sent on 08/05/2019 to [facility]. Copy resent to [facility] today by fax.

## 2019-08-12 NOTE — Patient Instructions (Signed)
Continue warfarin 1 tablet daily except 1/2 tablet on Sundays Recheck in 6 weeks 

## 2019-08-25 ENCOUNTER — Telehealth: Payer: Medicare HMO | Admitting: Cardiovascular Disease

## 2019-09-08 DIAGNOSIS — Z1331 Encounter for screening for depression: Secondary | ICD-10-CM | POA: Diagnosis not present

## 2019-09-08 DIAGNOSIS — I471 Supraventricular tachycardia: Secondary | ICD-10-CM | POA: Diagnosis not present

## 2019-09-08 DIAGNOSIS — I4891 Unspecified atrial fibrillation: Secondary | ICD-10-CM | POA: Diagnosis not present

## 2019-09-08 DIAGNOSIS — E1165 Type 2 diabetes mellitus with hyperglycemia: Secondary | ICD-10-CM | POA: Diagnosis not present

## 2019-09-08 DIAGNOSIS — Z7189 Other specified counseling: Secondary | ICD-10-CM | POA: Diagnosis not present

## 2019-09-08 DIAGNOSIS — Z6823 Body mass index (BMI) 23.0-23.9, adult: Secondary | ICD-10-CM | POA: Diagnosis not present

## 2019-09-08 DIAGNOSIS — Z1339 Encounter for screening examination for other mental health and behavioral disorders: Secondary | ICD-10-CM | POA: Diagnosis not present

## 2019-09-08 DIAGNOSIS — I10 Essential (primary) hypertension: Secondary | ICD-10-CM | POA: Diagnosis not present

## 2019-09-08 DIAGNOSIS — Z299 Encounter for prophylactic measures, unspecified: Secondary | ICD-10-CM | POA: Diagnosis not present

## 2019-09-08 DIAGNOSIS — Z0000 Encounter for general adult medical examination without abnormal findings: Secondary | ICD-10-CM | POA: Diagnosis not present

## 2019-09-16 ENCOUNTER — Ambulatory Visit (INDEPENDENT_AMBULATORY_CARE_PROVIDER_SITE_OTHER): Payer: Medicare HMO | Admitting: Gastroenterology

## 2019-09-23 ENCOUNTER — Ambulatory Visit (INDEPENDENT_AMBULATORY_CARE_PROVIDER_SITE_OTHER): Payer: Medicare HMO | Admitting: *Deleted

## 2019-09-23 DIAGNOSIS — I4891 Unspecified atrial fibrillation: Secondary | ICD-10-CM | POA: Diagnosis not present

## 2019-09-23 DIAGNOSIS — Z5181 Encounter for therapeutic drug level monitoring: Secondary | ICD-10-CM | POA: Diagnosis not present

## 2019-09-23 LAB — POCT INR: INR: 3 (ref 2.0–3.0)

## 2019-09-23 NOTE — Patient Instructions (Signed)
Continue warfarin 1 tablet daily except 1/2 tablet on Sundays Recheck in 6 weeks 

## 2019-09-28 NOTE — Progress Notes (Signed)
Cardiology Office Note  Date: 09/29/2019   ID: Lori David, DOB 16-Nov-1934, MRN [id number]  PCP:  Glenda Chroman, MD  Cardiologist:  No primary care provider on file. Electrophysiologist:  None   Chief Complaint: permanent atrial fibrillation / cardiomyopathy  History of Present Illness: Lori David is a 85 y.o. female with a history of permanent atrial fibrillation, cardiomyopathy, moderate MR and TR.  Echo ef 40-45% 2016. Repeat Echo 2017 EF improved to 50-55%. Moderate MR, Mild TR Severe LA dilatation.  Last seen by Dr. Bronson Ing via telemedicine 01/21/2019 via telemedicine. No CP, seldom SOB. Mild swelling in feet with tingling. Atrial fibrillation was symptomatically stable on diltiazem, digoxin, metoprolol. Anticoagulated with coumadin, Stable Mod. MR. BP was normal.  She is here today for 50-month follow-up.  He denies any recent acute illnesses or hospitalizations.  Denies any anginal or exertional symptoms, palpitations or arrhythmias, orthostatic symptoms, CVA or TIA-like symptoms, PND, orthopnea, bleeding issues, claudication-like symptoms, DVT or PE-like symptoms, or lower extremity edema.  States her blood sugars are doing well.  Blood pressure slightly elevated today at 134/90.   Past Medical History:  Diagnosis Date  . Arthritis    "in my back/gastro dr" (03/22/2014)  . Atrial flutter ([place])    a. Pt reports episode 02/2013 noted pre-op cataracts but was in NSR at time of f/u. Saw cardiology and was told she had a leaky heart valve.  . Family history of adverse reaction to anesthesia    "some people in my family have; don't know what happened though" (03/22/2014)  . GERD (gastroesophageal reflux disease)   . GI problem    a. Pt reports workup for abdominal sensitivity with unremarkable endoscopy and CT.  [name] Heart valve disease    leaking heart valve  . Hypercholesterolemia   . Hypertension   . Osteopenia   . Seasonal allergies   . Valvular heart disease     Past  Surgical History:  Procedure Laterality Date  . BACTERIAL OVERGROWTH TEST N/A 08/13/2014   Procedure: BACTERIAL OVERGROWTH TEST;  Surgeon: Rogene Houston, MD;  [facility];  Service: Endoscopy;  Laterality: N/A;  730  . CATARACT EXTRACTION W/ INTRAOCULAR LENS  IMPLANT, BILATERAL Bilateral 2015  . CATARACT EXTRACTION W/PHACO Right 06/30/2013   Procedure: CATARACT EXTRACTION PHACO AND INTRAOCULAR LENS PLACEMENT (IOC);  Surgeon: Elta Guadeloupe T. Gershon Crane, MD;  Location: [facility] ORS;  Service: Ophthalmology;  Laterality: Right;  CDE:7.99  . CATARACT EXTRACTION W/PHACO Left 07/14/2013   Procedure: CATARACT EXTRACTION PHACO AND INTRAOCULAR LENS PLACEMENT (IOC);  Surgeon: Elta Guadeloupe T. Gershon Crane, MD;  Location: [facility];  Service: Ophthalmology;  Laterality: Left;  CDE:  8.25  . CHOLECYSTECTOMY  2000?   [facility]  . ESOPHAGOGASTRODUODENOSCOPY N/A 01/20/2014   Procedure: ESOPHAGOGASTRODUODENOSCOPY (EGD);  Surgeon: Rogene Houston, MD;  Location[facility];  Service: Endoscopy;  Laterality: N/A;  120 - moved to 12:25 - [name] to notify pt  . LAPAROSCOPIC CHOLECYSTECTOMY  ~ 2000  . TONSILLECTOMY  1943   [facility]-no longer present  . TONSILLECTOMY  ~ 1943    Current Outpatient Medications  Medication Sig Dispense Refill  . acetaminophen (TYLENOL) 650 MG CR tablet Take 650 mg by mouth every 8 (eight) hours as needed for pain.    . calcium carbonate (TUMS - DOSED IN MG ELEMENTAL CALCIUM) 500 MG chewable tablet Chew 1 tablet by mouth daily as needed for indigestion or heartburn.    . cyanocobalamin (,VITAMIN B-12,) 1000 MCG/ML injection Inject  1,000 mcg into the muscle every 30 (thirty) days.     . digoxin (LANOXIN) 0.125 MG tablet TAKE 1 TABLET BY MOUTH EVERY DAY 30 tablet 3  . diltiazem (CARDIZEM CD) 120 MG 24 hr capsule Take 1 capsule (120 mg total) by mouth every evening. 90 capsule 1  . diltiazem (CARDIZEM CD) 240 MG 24 hr capsule Take 1 capsule (240 mg total) by mouth every morning. 90 capsule 1  .  metFORMIN (GLUCOPHAGE) 500 MG tablet Take 250 mg by mouth daily with breakfast.     . metoprolol tartrate (LOPRESSOR) 50 MG tablet TAKE (1) TABLET TWICE DAILY. 180 tablet 2  . RABEprazole (ACIPHEX) 20 MG tablet Take 20 mg by mouth daily.    . sennosides-docusate sodium (SENOKOT-S) 8.6-50 MG tablet Take 1 tablet by mouth 2 (two) times daily.    . Vitamin D, Ergocalciferol, (DRISDOL) 50000 UNITS CAPS capsule Take 50,000 Units by mouth every 7 (seven) days. Mondays    . warfarin (COUMADIN) 5 MG tablet TAKE 1 TABLET ONCE A DAY EXCEPT ON SUNDAYS TAKE 1/2 TABLET. 30 tablet 6   No current facility-administered medications for this visit.   Allergies:  Chocolate, Chocolate, Peanuts [peanut oil], Peanuts [peanut oil], Penicillins, and Penicillins   Social History: The patient  reports that she quit smoking about 45 years ago. Her smoking use included cigarettes. She has a 11.25 pack-year smoking history. She has never used smokeless tobacco. She reports that she does not drink alcohol and does not use drugs.   Family History: The patient's family history includes Asthma in an other family member; Atrial fibrillation in her sister; CAD in her brother, brother, father, and mother; Diabetes in an other family member; Emphysema in her father; Heart disease in her sister.   ROS:  Please see the history of present illness. Otherwise, complete review of systems is positive for none.  All other systems are reviewed and negative.   Physical Exam: VS:  BP 134/90   Pulse 76   Ht 5\' 4"  (1.626 m)   Wt 139 lb 9.6 oz (63.3 kg)   SpO2 97%   BMI 23.96 kg/m , BMI Body mass index is 23.96 kg/m.  Wt Readings from Last 3 Encounters:  09/29/19 139 lb 9.6 oz (63.3 kg)  01/21/19 140 lb (63.5 kg)  02/18/18 141 lb (64 kg)    General: Patient appears comfortable at rest. Neck: Supple, no elevated JVP or carotid bruits, no thyromegaly. Lungs: Clear to auscultation, nonlabored breathing at rest. Cardiac: Irregularly  irregular rate and rhythm, no S3 or significant systolic murmur, no pericardial rub. Extremities: No pitting edema, distal pulses 2+. Skin: Warm and dry. Musculoskeletal: No kyphosis. Neuropsychiatric: Alert and oriented x3, affect grossly appropriate.  ECG:    Recent Labwork: No results found for requested labs within last 8760 hours.     Component Value Date/Time   CHOL 138 03/23/2014 0442   TRIG 99 03/23/2014 0442   HDL 40 03/23/2014 0442   CHOLHDL 3.5 03/23/2014 0442   VLDL 20 03/23/2014 0442   LDLCALC 78 03/23/2014 0442    Other Studies Reviewed Today:  Echocardiogram 02/16/2016 Study Conclusions   - Left ventricle: The cavity size was normal. Wall thickness was  normal. Systolic function was normal. The estimated ejection  fraction was in the range of 50% to 55%. Wall motion was normal;  there were no regional wall motion abnormalities. The study is  not technically sufficient to allow evaluation of LV diastolic  function.  -  Aortic valve: Trileaflet; mildly calcified leaflets. There was no  stenosis.  - Mitral valve: There was moderate regurgitation.  - Left atrium: The atrium was severely dilated.  - Tricuspid valve: There was mild regurgitation.   Assessment and Plan:  1. Permanent atrial fibrillation ([place])   2. Nonischemic cardiomyopathy ([place])   3. Moderate mitral regurgitation   4. Essential hypertension, benign     1. Permanent atrial fibrillation (HCC) Heart rate is controlled on current medications with a rate of 76 today irregularly irregular.  Continue diltiazem 360 mg daily, digoxin 0.125 mg p.o. daily.  Metoprolol 50 mg p.o. twice daily.  Coumadin 5 mg daily.  2. Nonischemic cardiomyopathy ([place]) Most recent echocardiogram 2017 showed return of EF to 50 to 55%.  She denies any dyspnea, weight gain, or lower extremity edema.  3. Moderate mitral regurgitation Last echocardiogram 2017 showed moderate MR.  Stable symptoms.  4. Essential  hypertension, benign BP today 134/90.  Continue metoprolol 50 mg p.o. twice daily.  Medication Adjustments/Labs and Tests Ordered: Current medicines are reviewed at length with the patient today.  Concerns regarding medicines are outlined above.   Disposition: Follow-up with Dr. Domenic Polite or APP 6 months  Signed, Levell July, NP 09/29/2019 11:48 AM    [facility] at [place], [place], [place] [address] Phone: [phone]; Fax: [phone]

## 2019-09-29 ENCOUNTER — Ambulatory Visit (INDEPENDENT_AMBULATORY_CARE_PROVIDER_SITE_OTHER): Payer: Medicare HMO | Admitting: Family Medicine

## 2019-09-29 ENCOUNTER — Ambulatory Visit: Payer: Medicare HMO | Admitting: Cardiovascular Disease

## 2019-09-29 ENCOUNTER — Encounter: Payer: Self-pay | Admitting: Family Medicine

## 2019-09-29 VITALS — BP 134/90 | HR 76 | Ht 64.0 in | Wt 139.6 lb

## 2019-09-29 DIAGNOSIS — I4821 Permanent atrial fibrillation: Secondary | ICD-10-CM | POA: Diagnosis not present

## 2019-09-29 DIAGNOSIS — I10 Essential (primary) hypertension: Secondary | ICD-10-CM | POA: Diagnosis not present

## 2019-09-29 DIAGNOSIS — I428 Other cardiomyopathies: Secondary | ICD-10-CM | POA: Diagnosis not present

## 2019-09-29 DIAGNOSIS — I340 Nonrheumatic mitral (valve) insufficiency: Secondary | ICD-10-CM | POA: Diagnosis not present

## 2019-09-29 NOTE — Patient Instructions (Signed)

## 2019-11-03 ENCOUNTER — Other Ambulatory Visit: Payer: Self-pay | Admitting: *Deleted

## 2019-11-03 MED ORDER — DILTIAZEM HCL ER COATED BEADS 120 MG PO CP24: 120.0000 mg | ORAL_CAPSULE | Freq: Every evening | ORAL | 1 refills | Status: DC

## 2019-11-03 MED ORDER — DIGOXIN 125 MCG PO TABS: 125.0000 ug | ORAL_TABLET | Freq: Every day | ORAL | 1 refills | Status: DC

## 2019-11-05 ENCOUNTER — Ambulatory Visit (INDEPENDENT_AMBULATORY_CARE_PROVIDER_SITE_OTHER): Payer: Medicare HMO | Admitting: *Deleted

## 2019-11-05 ENCOUNTER — Other Ambulatory Visit: Payer: Self-pay

## 2019-11-05 DIAGNOSIS — Z5181 Encounter for therapeutic drug level monitoring: Secondary | ICD-10-CM | POA: Diagnosis not present

## 2019-11-05 DIAGNOSIS — I4891 Unspecified atrial fibrillation: Secondary | ICD-10-CM | POA: Diagnosis not present

## 2019-11-05 LAB — POCT INR: INR: 3.2 — AB (ref 2.0–3.0)

## 2019-11-05 NOTE — Patient Instructions (Signed)
Decrease warfarin to 1 tablet daily except 1/2 tablet on Sundays and Thursdays  Recheck in 6 weeks.

## 2019-11-25 DIAGNOSIS — I471 Supraventricular tachycardia: Secondary | ICD-10-CM | POA: Diagnosis not present

## 2019-11-25 DIAGNOSIS — E1129 Type 2 diabetes mellitus with other diabetic kidney complication: Secondary | ICD-10-CM | POA: Diagnosis not present

## 2019-11-25 DIAGNOSIS — Z299 Encounter for prophylactic measures, unspecified: Secondary | ICD-10-CM | POA: Diagnosis not present

## 2019-11-25 DIAGNOSIS — R809 Proteinuria, unspecified: Secondary | ICD-10-CM | POA: Diagnosis not present

## 2019-11-25 DIAGNOSIS — J320 Chronic maxillary sinusitis: Secondary | ICD-10-CM | POA: Diagnosis not present

## 2019-12-14 ENCOUNTER — Other Ambulatory Visit: Payer: Self-pay | Admitting: *Deleted

## 2019-12-14 MED ORDER — WARFARIN SODIUM 5 MG PO TABS: ORAL_TABLET | ORAL | 6 refills | Status: DC

## 2019-12-17 ENCOUNTER — Ambulatory Visit (INDEPENDENT_AMBULATORY_CARE_PROVIDER_SITE_OTHER): Payer: Medicare HMO | Admitting: *Deleted

## 2019-12-17 ENCOUNTER — Other Ambulatory Visit: Payer: Self-pay | Admitting: *Deleted

## 2019-12-17 DIAGNOSIS — I4891 Unspecified atrial fibrillation: Secondary | ICD-10-CM

## 2019-12-17 DIAGNOSIS — Z5181 Encounter for therapeutic drug level monitoring: Secondary | ICD-10-CM | POA: Diagnosis not present

## 2019-12-17 LAB — POCT INR: INR: 2.2 (ref 2.0–3.0)

## 2019-12-17 MED ORDER — METOPROLOL TARTRATE 50 MG PO TABS
50.0000 mg | ORAL_TABLET | Freq: Two times a day (BID) | ORAL | 2 refills | Status: DC
Start: 2019-12-17 — End: 2020-10-14

## 2019-12-17 NOTE — Patient Instructions (Signed)
Continue warfarin 1 tablet daily except 1/2 tablet on Sundays and Thursdays  Recheck in 6 weeks.

## 2020-01-27 ENCOUNTER — Ambulatory Visit (INDEPENDENT_AMBULATORY_CARE_PROVIDER_SITE_OTHER): Payer: Medicare HMO | Admitting: *Deleted

## 2020-01-27 DIAGNOSIS — I4891 Unspecified atrial fibrillation: Secondary | ICD-10-CM | POA: Diagnosis not present

## 2020-01-27 DIAGNOSIS — Z5181 Encounter for therapeutic drug level monitoring: Secondary | ICD-10-CM | POA: Diagnosis not present

## 2020-01-27 LAB — POCT INR: INR: 2.3 (ref 2.0–3.0)

## 2020-01-27 NOTE — Patient Instructions (Signed)
Continue warfarin 1 tablet daily except 1/2 tablet on Sundays and Thursdays  Recheck in 6 weeks.

## 2020-02-08 ENCOUNTER — Other Ambulatory Visit: Payer: Self-pay

## 2020-02-08 MED ORDER — DILTIAZEM HCL ER COATED BEADS 240 MG PO CP24: 240.0000 mg | ORAL_CAPSULE | Freq: Every morning | ORAL | 1 refills | Status: DC

## 2020-02-24 DIAGNOSIS — D1801 Hemangioma of skin and subcutaneous tissue: Secondary | ICD-10-CM | POA: Diagnosis not present

## 2020-02-24 DIAGNOSIS — D485 Neoplasm of uncertain behavior of skin: Secondary | ICD-10-CM | POA: Diagnosis not present

## 2020-02-24 DIAGNOSIS — L570 Actinic keratosis: Secondary | ICD-10-CM | POA: Diagnosis not present

## 2020-02-24 DIAGNOSIS — L820 Inflamed seborrheic keratosis: Secondary | ICD-10-CM | POA: Diagnosis not present

## 2020-02-24 DIAGNOSIS — L814 Other melanin hyperpigmentation: Secondary | ICD-10-CM | POA: Diagnosis not present

## 2020-02-24 DIAGNOSIS — L821 Other seborrheic keratosis: Secondary | ICD-10-CM | POA: Diagnosis not present

## 2020-05-06 ENCOUNTER — Other Ambulatory Visit: Payer: Self-pay | Admitting: Family Medicine

## 2020-05-23 DIAGNOSIS — Z299 Encounter for prophylactic measures, unspecified: Secondary | ICD-10-CM | POA: Diagnosis not present

## 2020-05-23 DIAGNOSIS — E1165 Type 2 diabetes mellitus with hyperglycemia: Secondary | ICD-10-CM | POA: Diagnosis not present

## 2020-05-23 DIAGNOSIS — J069 Acute upper respiratory infection, unspecified: Secondary | ICD-10-CM | POA: Diagnosis not present

## 2020-05-23 DIAGNOSIS — I10 Essential (primary) hypertension: Secondary | ICD-10-CM | POA: Diagnosis not present

## 2020-05-23 DIAGNOSIS — I4891 Unspecified atrial fibrillation: Secondary | ICD-10-CM | POA: Diagnosis not present

## 2020-06-18 ENCOUNTER — Other Ambulatory Visit: Payer: Self-pay | Admitting: Family Medicine

## 2020-07-16 ENCOUNTER — Other Ambulatory Visit: Payer: Self-pay | Admitting: Family Medicine

## 2020-08-04 ENCOUNTER — Other Ambulatory Visit: Payer: Self-pay | Admitting: Family Medicine

## 2020-08-16 ENCOUNTER — Other Ambulatory Visit: Payer: Self-pay | Admitting: Family Medicine

## 2020-08-26 ENCOUNTER — Telehealth: Payer: Self-pay | Admitting: Family Medicine

## 2020-08-26 MED ORDER — WARFARIN SODIUM 5 MG PO TABS: ORAL_TABLET | ORAL | 0 refills | Status: DC

## 2020-08-26 NOTE — Telephone Encounter (Signed)
New message    *STAT* If patient is at the pharmacy, call can be transferred to refill team.   1. Which medications need to be refilled? (please list name of each medication and dose if known) metoprolol tartrate (LOPRESSOR) 50 MG tablet digoxin (LANOXIN) 0.125 MG tablet TAKE 1 TABLET ONCE DAILY.    diltiazem (CARDIZEM CD) 120 MG 24 hr capsule TAKE 1 CAPSULE IN THE EVENING.   diltiazem (CARDIZEM CD) 240 MG 24 hr capsule    warfarin (COUMADIN) 5 MG tablet 2. Which pharmacy/location (including street and city if local pharmacy) is medication to be sent to? [facility]   3. Do they need a 30 day or 90 day supply? 100 day supply - needs new prescriptioin  - [facility] is giving discount for the 100 day supply

## 2020-08-26 NOTE — Telephone Encounter (Signed)
Pt is past due for INR appt and MD appt.  INR appt scheduled for 08/31/20.  Message sent to [place] to schedule MD appt.  Warfarin refill x 1.

## 2020-08-31 ENCOUNTER — Ambulatory Visit (INDEPENDENT_AMBULATORY_CARE_PROVIDER_SITE_OTHER): Payer: Medicare HMO | Admitting: *Deleted

## 2020-08-31 DIAGNOSIS — I4891 Unspecified atrial fibrillation: Secondary | ICD-10-CM | POA: Diagnosis not present

## 2020-08-31 DIAGNOSIS — Z5181 Encounter for therapeutic drug level monitoring: Secondary | ICD-10-CM | POA: Diagnosis not present

## 2020-08-31 LAB — POCT INR: INR: 3 (ref 2.0–3.0)

## 2020-08-31 NOTE — Patient Instructions (Signed)
Continue warfarin 1 tablet daily except 1/2 tablet on Sundays and Thursdays  Recheck in 6 weeks.

## 2020-09-13 DIAGNOSIS — Z79899 Other long term (current) drug therapy: Secondary | ICD-10-CM | POA: Diagnosis not present

## 2020-09-13 DIAGNOSIS — Z7189 Other specified counseling: Secondary | ICD-10-CM | POA: Diagnosis not present

## 2020-09-13 DIAGNOSIS — Z1339 Encounter for screening examination for other mental health and behavioral disorders: Secondary | ICD-10-CM | POA: Diagnosis not present

## 2020-09-13 DIAGNOSIS — E7800 Pure hypercholesterolemia, unspecified: Secondary | ICD-10-CM | POA: Diagnosis not present

## 2020-09-13 DIAGNOSIS — E559 Vitamin D deficiency, unspecified: Secondary | ICD-10-CM | POA: Diagnosis not present

## 2020-09-13 DIAGNOSIS — Z6823 Body mass index (BMI) 23.0-23.9, adult: Secondary | ICD-10-CM | POA: Diagnosis not present

## 2020-09-13 DIAGNOSIS — R5383 Other fatigue: Secondary | ICD-10-CM | POA: Diagnosis not present

## 2020-09-13 DIAGNOSIS — E1129 Type 2 diabetes mellitus with other diabetic kidney complication: Secondary | ICD-10-CM | POA: Diagnosis not present

## 2020-09-13 DIAGNOSIS — R809 Proteinuria, unspecified: Secondary | ICD-10-CM | POA: Diagnosis not present

## 2020-09-13 DIAGNOSIS — Z299 Encounter for prophylactic measures, unspecified: Secondary | ICD-10-CM | POA: Diagnosis not present

## 2020-09-13 DIAGNOSIS — I10 Essential (primary) hypertension: Secondary | ICD-10-CM | POA: Diagnosis not present

## 2020-09-13 DIAGNOSIS — Z0000 Encounter for general adult medical examination without abnormal findings: Secondary | ICD-10-CM | POA: Diagnosis not present

## 2020-09-13 DIAGNOSIS — Z1331 Encounter for screening for depression: Secondary | ICD-10-CM | POA: Diagnosis not present

## 2020-09-13 DIAGNOSIS — D518 Other vitamin B12 deficiency anemias: Secondary | ICD-10-CM | POA: Diagnosis not present

## 2020-09-22 NOTE — Progress Notes (Signed)
Cardiology Office Note  Date: 09/23/2020   ID: Lori David, DOB Apr 20, 1934, MRN [id number]  PCP:  Glenda Chroman, MD  Cardiologist:  None Electrophysiologist:  None   Chief Complaint: 8-monthfollow-up  History of Present Illness: Lori Lideis a 86y.o. female with a history of permanent atrial fibrillation, cardiomyopathy, moderate MR and TR.  Echo ef 40-45% 2016. Repeat Echo 2017 EF improved to 50-55%. Moderate MR, Mild TR Severe LA dilatation.  Last seen by Dr. KBronson Ingvia telemedicine 01/21/2019 via telemedicine. No CP, seldom SOB. Mild swelling in feet with tingling. Atrial fibrillation was symptomatically stable on diltiazem, digoxin, metoprolol. Anticoagulated with coumadin, Stable Mod. MR. BP was normal.  She was last to for 632-monthollow-up.  She denies any recent acute illnesses or hospitalizations.  Denies any anginal or exertional symptoms, palpitations or arrhythmias, orthostatic symptoms, CVA or TIA-like symptoms, PND, orthopnea, bleeding issues, claudication-like symptoms, DVT or PE-like symptoms, or lower extremity edema.  Her blood sugars were doing well.  Blood pressure slightly elevated at 134/90.  She is here for 1 year follow-up today.  She states in the interim since last visit she tested positive for COVID but had very mild symptoms.  Otherwise no recent acute illnesses, hospitalizations, surgeries.  She states she has been having some issues with her stomach but no one has been able to determine the diagnosis.  She does have a history of GERD with abdominal sensitivity with unremarkable endoscopic and CT.  Her primary symptoms have been abdominal pain, GERD, weight loss.  History of prior cholecystectomy.  Otherwise she denies any sensation of palpitations or arrhythmias.  EKG today shows atrial fibrillation with a controlled rate of 63.  ST abnormality, possible digitalis effect.  Denies any anginal or exertional symptoms, orthostatic symptoms, CVA or TIA-like  symptoms.  Denies any bleeding issues.  No claudication-like symptoms, DVT or PE-like symptoms, or lower extremity edema.  Past Medical History:  Diagnosis Date   Arthritis    "in my back/gastro dr" (03/22/2014)   Atrial flutter ([place]   a. Pt reports episode 02/2013 noted pre-op cataracts but was in NSR at time of f/u. Saw cardiology and was told she had a leaky heart valve.   Family history of adverse reaction to anesthesia    "some people in my family have; don't know what happened though" (03/22/2014)   GERD (gastroesophageal reflux disease)    GI problem    a. Pt reports workup for abdominal sensitivity with unremarkable endoscopy and CT.   Heart valve disease    leaking heart valve   Hypercholesterolemia    Hypertension    Osteopenia    Seasonal allergies    Valvular heart disease     Past Surgical History:  Procedure Laterality Date   BACTERIAL OVERGROWTH TEST N/A 08/13/2014   Procedure: BACTERIAL OVERGROWTH TEST;  Surgeon: NaRogene HoustonMD;  [facility];  Service: Endoscopy;  Laterality: N/A;  730   CATARACT EXTRACTION W/ INTRAOCULAR LENS  IMPLANT, BILATERAL Bilateral 2015   CATARACT EXTRACTION W/PHACO Right 06/30/2013   Procedure: CATARACT EXTRACTION PHACO AND INTRAOCULAR LENS PLACEMENT (IORusk  Surgeon: MaElta Guadeloupe. ShGershon CraneMD;  Location: [facility];  Service: Ophthalmology;  Laterality: Right;  CDE:7.99   CATARACT EXTRACTION W/PHACO Left 07/14/2013   Procedure: CATARACT EXTRACTION PHACO AND INTRAOCULAR LENS PLACEMENT (IOC);  Surgeon: MaElta Guadeloupe. ShGershon CraneMD;  Location: [facility];  Service: Ophthalmology;  Laterality: Left;  CDE:  8.25   CHOLECYSTECTOMY  2000?  [facility]   ESOPHAGOGASTRODUODENOSCOPY N/A 01/20/2014   Procedure: ESOPHAGOGASTRODUODENOSCOPY (EGD);  Surgeon: Rogene Houston, MD;  [facility];  Service: Endoscopy;  Laterality: N/A;  120 - moved to 12:25 - [name] to notify pt   LAPAROSCOPIC CHOLECYSTECTOMY  ~ [facility]-no  longer present   TONSILLECTOMY  ~ 1943    Current Outpatient Medications  Medication Sig Dispense Refill   acetaminophen (TYLENOL) 650 MG CR tablet Take 650 mg by mouth every 8 (eight) hours as needed for pain.     calcium carbonate (TUMS - DOSED IN MG ELEMENTAL CALCIUM) 500 MG chewable tablet Chew 1 tablet by mouth daily as needed for indigestion or heartburn.     cyanocobalamin (,VITAMIN B-12,) 1000 MCG/ML injection Inject 1,000 mcg into the muscle every 30 (thirty) days.      digoxin (LANOXIN) 0.125 MG tablet TAKE 1 TABLET ONCE DAILY. 30 tablet 0   diltiazem (CARDIZEM CD) 120 MG 24 hr capsule TAKE 1 CAPSULE IN THE EVENING. 90 capsule 0   diltiazem (CARDIZEM CD) 240 MG 24 hr capsule TAKE 1 CAPSULE BY MOUTH EVERY MORNING. 90 capsule 0   famotidine (PEPCID) 40 MG tablet Take 40 mg by mouth at bedtime.     metFORMIN (GLUCOPHAGE) 500 MG tablet Take 250 mg by mouth daily with breakfast.      metoprolol tartrate (LOPRESSOR) 50 MG tablet Take 1 tablet (50 mg total) by mouth 2 (two) times daily. 180 tablet 2   RABEprazole (ACIPHEX) 20 MG tablet Take 20 mg by mouth 2 (two) times daily.     sennosides-docusate sodium (SENOKOT-S) 8.6-50 MG tablet Take 1 tablet by mouth 2 (two) times daily.     Vitamin D, Ergocalciferol, (DRISDOL) 50000 UNITS CAPS capsule Take 50,000 Units by mouth every 7 (seven) days. Mondays     warfarin (COUMADIN) 5 MG tablet TAKE 1 TABLET EVERY DAY EXCEPT ON SUNDAYS AND THURSDAY TAKE 1/2 TABLET. 100 tablet 0   No current facility-administered medications for this visit.   Allergies:  Chocolate, Chocolate, Peanuts [peanut oil], Peanuts [peanut oil], Penicillins, and Penicillins   Social History: The patient  reports that she quit smoking about 46 years ago. Her smoking use included cigarettes. She has a 11.25 pack-year smoking history. She has never used smokeless tobacco. She reports that she does not drink alcohol and does not use drugs.   Family History: The patient's family  history includes Asthma in an other family member; Atrial fibrillation in her sister; CAD in her brother, brother, father, and mother; Diabetes in an other family member; Emphysema in her father; Heart disease in her sister.   ROS:  Please see the history of present illness. Otherwise, complete review of systems is positive for none.  All other systems are reviewed and negative.   Physical Exam: VS:  BP 114/70   Pulse 70   Ht '5\' 4"'$  (1.626 m)   Wt 141 lb 3.2 oz (64 kg)   SpO2 99%   BMI 24.24 kg/m , BMI Body mass index is 24.24 kg/m.  Wt Readings from Last 3 Encounters:  09/23/20 141 lb 3.2 oz (64 kg)  09/29/19 139 lb 9.6 oz (63.3 kg)  01/21/19 140 lb (63.5 kg)    General: Patient appears comfortable at rest. Neck: Supple, no elevated JVP or carotid bruits, no thyromegaly. Lungs: Clear to auscultation, nonlabored breathing at rest. Cardiac: Irregularly irregular rate and rhythm, no S3 or significant systolic murmur, no pericardial  rub. Extremities: No pitting edema, distal pulses 2+. Skin: Warm and dry. Musculoskeletal: No kyphosis. Neuropsychiatric: Alert and oriented x3, affect grossly appropriate.  ECG: 09/23/2020 EKG atrial fibrillation rate of 63 ST abnormality, possible digitalis effect.  Recent Labwork: No results found for requested labs within last 8760 hours.     Component Value Date/Time   CHOL 138 03/23/2014 0442   TRIG 99 03/23/2014 0442   HDL 40 03/23/2014 0442   CHOLHDL 3.5 03/23/2014 0442   VLDL 20 03/23/2014 0442   LDLCALC 78 03/23/2014 0442    Other Studies Reviewed Today:  Echocardiogram 02/16/2016 Study Conclusions   - Left ventricle: The cavity size was normal. Wall thickness was    normal. Systolic function was normal. The estimated ejection    fraction was in the range of 50% to 55%. Wall motion was normal;    there were no regional wall motion abnormalities. The study is    not technically sufficient to allow evaluation of LV diastolic     function.  - Aortic valve: Trileaflet; mildly calcified leaflets. There was no    stenosis.  - Mitral valve: There was moderate regurgitation.  - Left atrium: The atrium was severely dilated.  - Tricuspid valve: There was mild regurgitation.   Assessment and Plan:  1. Permanent atrial fibrillation ([place])   2. Nonischemic cardiomyopathy ([place])   3. Moderate mitral regurgitation   4. Essential hypertension, benign      1. Permanent atrial fibrillation (HCC) EKG today demonstrates atrial fibrillation with a rate of 63, ST abnormality, possible digitalis effect.  Continue diltiazem 360 mg daily, digoxin 0.125 mg p.o. daily.  Metoprolol 50 mg p.o. twice daily.  Coumadin 5 mg daily.  2. Nonischemic cardiomyopathy ([place]) Most recent echocardiogram 2017 showed return of EF to 50 to 55%.  She denies any dyspnea, weight gain, or lower extremity edema.  3. Moderate mitral regurgitation Last echocardiogram 2017 showed moderate MR.  Stable symptoms.  4. Essential hypertension, benign BP today 114/70.  Continue metoprolol 50 mg p.o. twice daily.  Medication Adjustments/Labs and Tests Ordered: Current medicines are reviewed at length with the patient today.  Concerns regarding medicines are outlined above.   Disposition: Follow-up with Dr. Domenic Polite or APP 1 year  Signed, Levell July, NP 09/23/2020 2:42 PM    [facility] at [place], [place], [place] [address] Phone: [phone]; Fax: [phone]

## 2020-09-23 ENCOUNTER — Ambulatory Visit (INDEPENDENT_AMBULATORY_CARE_PROVIDER_SITE_OTHER): Payer: Medicare HMO | Admitting: Family Medicine

## 2020-09-23 ENCOUNTER — Other Ambulatory Visit: Payer: Self-pay

## 2020-09-23 ENCOUNTER — Encounter: Payer: Self-pay | Admitting: Family Medicine

## 2020-09-23 VITALS — BP 114/70 | HR 70 | Ht 64.0 in | Wt 141.2 lb

## 2020-09-23 DIAGNOSIS — I428 Other cardiomyopathies: Secondary | ICD-10-CM | POA: Diagnosis not present

## 2020-09-23 DIAGNOSIS — I4821 Permanent atrial fibrillation: Secondary | ICD-10-CM

## 2020-09-23 DIAGNOSIS — I10 Essential (primary) hypertension: Secondary | ICD-10-CM | POA: Diagnosis not present

## 2020-09-23 DIAGNOSIS — I340 Nonrheumatic mitral (valve) insufficiency: Secondary | ICD-10-CM

## 2020-09-23 NOTE — Patient Instructions (Addendum)

## 2020-10-13 ENCOUNTER — Ambulatory Visit (INDEPENDENT_AMBULATORY_CARE_PROVIDER_SITE_OTHER): Payer: Medicare HMO | Admitting: *Deleted

## 2020-10-13 DIAGNOSIS — I4891 Unspecified atrial fibrillation: Secondary | ICD-10-CM | POA: Diagnosis not present

## 2020-10-13 DIAGNOSIS — Z5181 Encounter for therapeutic drug level monitoring: Secondary | ICD-10-CM | POA: Diagnosis not present

## 2020-10-13 LAB — POCT INR: INR: 2 (ref 2.0–3.0)

## 2020-10-13 NOTE — Patient Instructions (Signed)
Description   Continue warfarin 1 tablet daily except 1/2 tablet on Sundays and Thursdays  Recheck in 6 weeks.

## 2020-10-14 ENCOUNTER — Other Ambulatory Visit: Payer: Self-pay | Admitting: Family Medicine

## 2020-11-04 ENCOUNTER — Other Ambulatory Visit: Payer: Self-pay | Admitting: Family Medicine

## 2020-11-24 ENCOUNTER — Ambulatory Visit (INDEPENDENT_AMBULATORY_CARE_PROVIDER_SITE_OTHER): Payer: Medicare HMO | Admitting: *Deleted

## 2020-11-24 ENCOUNTER — Other Ambulatory Visit: Payer: Self-pay

## 2020-11-24 DIAGNOSIS — Z5181 Encounter for therapeutic drug level monitoring: Secondary | ICD-10-CM

## 2020-11-24 DIAGNOSIS — I4891 Unspecified atrial fibrillation: Secondary | ICD-10-CM

## 2020-11-24 LAB — POCT INR: INR: 2.3 (ref 2.0–3.0)

## 2020-11-24 NOTE — Patient Instructions (Signed)
Continue warfarin 1 tablet daily except 1/2 tablet on Sundays and Thursdays  Recheck in 6 weeks.

## 2021-01-05 ENCOUNTER — Ambulatory Visit (INDEPENDENT_AMBULATORY_CARE_PROVIDER_SITE_OTHER): Payer: Medicare HMO | Admitting: *Deleted

## 2021-01-05 DIAGNOSIS — Z5181 Encounter for therapeutic drug level monitoring: Secondary | ICD-10-CM

## 2021-01-05 DIAGNOSIS — I4891 Unspecified atrial fibrillation: Secondary | ICD-10-CM | POA: Diagnosis not present

## 2021-01-05 LAB — POCT INR: INR: 3.5 — AB (ref 2.0–3.0)

## 2021-01-05 NOTE — Patient Instructions (Signed)
Hold warfarin tonight then resume 1 tablet daily except 1/2 tablet on Sundays and Thursdays  Recheck in 6 weeks.

## 2021-01-16 ENCOUNTER — Other Ambulatory Visit: Payer: Self-pay | Admitting: Family Medicine

## 2021-02-16 ENCOUNTER — Other Ambulatory Visit: Payer: Self-pay

## 2021-02-16 ENCOUNTER — Ambulatory Visit (INDEPENDENT_AMBULATORY_CARE_PROVIDER_SITE_OTHER): Payer: Medicare HMO | Admitting: *Deleted

## 2021-02-16 DIAGNOSIS — Z5181 Encounter for therapeutic drug level monitoring: Secondary | ICD-10-CM | POA: Diagnosis not present

## 2021-02-16 DIAGNOSIS — I4891 Unspecified atrial fibrillation: Secondary | ICD-10-CM

## 2021-02-16 LAB — POCT INR: INR: 3.2 — AB (ref 2.0–3.0)

## 2021-02-16 NOTE — Patient Instructions (Signed)
Decrease warfarin to 1 tablet daily except 1/2 tablet on Sundays, Tuesdays and Thursdays  Recheck in 6 weeks.

## 2021-03-30 ENCOUNTER — Ambulatory Visit (INDEPENDENT_AMBULATORY_CARE_PROVIDER_SITE_OTHER): Payer: Medicare HMO | Admitting: *Deleted

## 2021-03-30 DIAGNOSIS — Z5181 Encounter for therapeutic drug level monitoring: Secondary | ICD-10-CM

## 2021-03-30 DIAGNOSIS — I4891 Unspecified atrial fibrillation: Secondary | ICD-10-CM | POA: Diagnosis not present

## 2021-03-30 LAB — POCT INR: INR: 2.6 (ref 2.0–3.0)

## 2021-03-30 NOTE — Patient Instructions (Signed)
Description   Continue to take 1 tablet daily except 1/2 tablet on Sundays, Tuesdays and Thursdays  Recheck in 6 weeks.

## 2021-05-16 ENCOUNTER — Other Ambulatory Visit: Payer: Self-pay | Admitting: *Deleted

## 2021-05-16 ENCOUNTER — Ambulatory Visit (INDEPENDENT_AMBULATORY_CARE_PROVIDER_SITE_OTHER): Payer: Medicare HMO | Admitting: *Deleted

## 2021-05-16 DIAGNOSIS — I4891 Unspecified atrial fibrillation: Secondary | ICD-10-CM

## 2021-05-16 DIAGNOSIS — Z5181 Encounter for therapeutic drug level monitoring: Secondary | ICD-10-CM | POA: Diagnosis not present

## 2021-05-16 LAB — POCT INR: INR: 2.2 (ref 2.0–3.0)

## 2021-05-16 MED ORDER — DIGOXIN 125 MCG PO TABS: 125.0000 ug | ORAL_TABLET | Freq: Every day | ORAL | 6 refills | Status: DC

## 2021-05-16 NOTE — Patient Instructions (Signed)
Continue to take 1 tablet daily except 1/2 tablet on Sundays, Tuesdays and Thursdays ? Recheck in 6 weeks.  ?

## 2021-06-13 DIAGNOSIS — R809 Proteinuria, unspecified: Secondary | ICD-10-CM | POA: Diagnosis not present

## 2021-06-13 DIAGNOSIS — Z299 Encounter for prophylactic measures, unspecified: Secondary | ICD-10-CM | POA: Diagnosis not present

## 2021-06-13 DIAGNOSIS — E1129 Type 2 diabetes mellitus with other diabetic kidney complication: Secondary | ICD-10-CM | POA: Diagnosis not present

## 2021-06-13 DIAGNOSIS — Z87891 Personal history of nicotine dependence: Secondary | ICD-10-CM | POA: Diagnosis not present

## 2021-06-13 DIAGNOSIS — I4891 Unspecified atrial fibrillation: Secondary | ICD-10-CM | POA: Diagnosis not present

## 2021-06-13 DIAGNOSIS — H109 Unspecified conjunctivitis: Secondary | ICD-10-CM | POA: Diagnosis not present

## 2021-06-27 ENCOUNTER — Ambulatory Visit (INDEPENDENT_AMBULATORY_CARE_PROVIDER_SITE_OTHER): Payer: Medicare HMO | Admitting: *Deleted

## 2021-06-27 DIAGNOSIS — I4891 Unspecified atrial fibrillation: Secondary | ICD-10-CM

## 2021-06-27 DIAGNOSIS — Z5181 Encounter for therapeutic drug level monitoring: Secondary | ICD-10-CM

## 2021-06-27 LAB — POCT INR: INR: 2 (ref 2.0–3.0)

## 2021-06-27 NOTE — Patient Instructions (Signed)
Take warfarin 1 tablet today then resume 1 tablet daily except 1/2 tablet on Sundays, Tuesdays and Thursdays ? Recheck in 7 weeks.  ?

## 2021-08-24 ENCOUNTER — Ambulatory Visit (INDEPENDENT_AMBULATORY_CARE_PROVIDER_SITE_OTHER): Payer: Medicare HMO | Admitting: *Deleted

## 2021-08-24 DIAGNOSIS — I4891 Unspecified atrial fibrillation: Secondary | ICD-10-CM

## 2021-08-24 DIAGNOSIS — Z5181 Encounter for therapeutic drug level monitoring: Secondary | ICD-10-CM

## 2021-08-24 LAB — POCT INR: INR: 1.9 — AB (ref 2.0–3.0)

## 2021-08-24 NOTE — Patient Instructions (Signed)
Increase warfarin to 1 tablet daily except 1/2 tablet on Sundays and Wednesdays  Recheck in 7 weeks.

## 2021-09-04 ENCOUNTER — Other Ambulatory Visit: Payer: Self-pay | Admitting: Cardiology

## 2021-09-04 NOTE — Telephone Encounter (Signed)
RECEIVED REQUEST FOR WARFARIN REFILL:  Last INR was 1.9 on 08/24/21 Next INR due on 10/12/21 LOV was 06/27/21  Zandra Abts MD  Refill approved.

## 2021-09-26 DIAGNOSIS — Z1339 Encounter for screening examination for other mental health and behavioral disorders: Secondary | ICD-10-CM | POA: Diagnosis not present

## 2021-09-26 DIAGNOSIS — Z0000 Encounter for general adult medical examination without abnormal findings: Secondary | ICD-10-CM | POA: Diagnosis not present

## 2021-09-26 DIAGNOSIS — E1165 Type 2 diabetes mellitus with hyperglycemia: Secondary | ICD-10-CM | POA: Diagnosis not present

## 2021-09-26 DIAGNOSIS — E538 Deficiency of other specified B group vitamins: Secondary | ICD-10-CM | POA: Diagnosis not present

## 2021-09-26 DIAGNOSIS — Z1331 Encounter for screening for depression: Secondary | ICD-10-CM | POA: Diagnosis not present

## 2021-09-26 DIAGNOSIS — Z789 Other specified health status: Secondary | ICD-10-CM | POA: Diagnosis not present

## 2021-09-26 DIAGNOSIS — Z79899 Other long term (current) drug therapy: Secondary | ICD-10-CM | POA: Diagnosis not present

## 2021-09-26 DIAGNOSIS — R5383 Other fatigue: Secondary | ICD-10-CM | POA: Diagnosis not present

## 2021-09-26 DIAGNOSIS — E559 Vitamin D deficiency, unspecified: Secondary | ICD-10-CM | POA: Diagnosis not present

## 2021-09-26 DIAGNOSIS — Z299 Encounter for prophylactic measures, unspecified: Secondary | ICD-10-CM | POA: Diagnosis not present

## 2021-09-26 DIAGNOSIS — I10 Essential (primary) hypertension: Secondary | ICD-10-CM | POA: Diagnosis not present

## 2021-09-26 DIAGNOSIS — Z6824 Body mass index (BMI) 24.0-24.9, adult: Secondary | ICD-10-CM | POA: Diagnosis not present

## 2021-09-26 DIAGNOSIS — Z7189 Other specified counseling: Secondary | ICD-10-CM | POA: Diagnosis not present

## 2021-09-26 DIAGNOSIS — E7800 Pure hypercholesterolemia, unspecified: Secondary | ICD-10-CM | POA: Diagnosis not present

## 2021-10-12 ENCOUNTER — Ambulatory Visit (INDEPENDENT_AMBULATORY_CARE_PROVIDER_SITE_OTHER): Payer: Medicare HMO | Admitting: *Deleted

## 2021-10-12 DIAGNOSIS — I4891 Unspecified atrial fibrillation: Secondary | ICD-10-CM

## 2021-10-12 DIAGNOSIS — Z5181 Encounter for therapeutic drug level monitoring: Secondary | ICD-10-CM

## 2021-10-12 LAB — POCT INR: INR: 2.4 (ref 2.0–3.0)

## 2021-10-12 NOTE — Patient Instructions (Signed)
Continue warfarin 1 tablet daily except 1/2 tablet on Sundays and Wednesdays  Recheck in 7 weeks.  

## 2021-10-24 ENCOUNTER — Other Ambulatory Visit: Payer: Self-pay | Admitting: Cardiology

## 2021-10-24 DIAGNOSIS — L0291 Cutaneous abscess, unspecified: Secondary | ICD-10-CM | POA: Diagnosis not present

## 2021-10-24 DIAGNOSIS — I10 Essential (primary) hypertension: Secondary | ICD-10-CM | POA: Diagnosis not present

## 2021-10-24 DIAGNOSIS — Z789 Other specified health status: Secondary | ICD-10-CM | POA: Diagnosis not present

## 2021-10-24 DIAGNOSIS — Z299 Encounter for prophylactic measures, unspecified: Secondary | ICD-10-CM | POA: Diagnosis not present

## 2021-10-31 DIAGNOSIS — L02414 Cutaneous abscess of left upper limb: Secondary | ICD-10-CM | POA: Diagnosis not present

## 2021-10-31 DIAGNOSIS — Z789 Other specified health status: Secondary | ICD-10-CM | POA: Diagnosis not present

## 2021-10-31 DIAGNOSIS — Z299 Encounter for prophylactic measures, unspecified: Secondary | ICD-10-CM | POA: Diagnosis not present

## 2021-10-31 DIAGNOSIS — I10 Essential (primary) hypertension: Secondary | ICD-10-CM | POA: Diagnosis not present

## 2021-11-07 ENCOUNTER — Other Ambulatory Visit: Payer: Self-pay | Admitting: *Deleted

## 2021-11-07 MED ORDER — DILTIAZEM HCL ER COATED BEADS 120 MG PO CP24: ORAL_CAPSULE | ORAL | 1 refills | Status: DC

## 2021-11-07 MED ORDER — DILTIAZEM HCL ER COATED BEADS 240 MG PO CP24: 240.0000 mg | ORAL_CAPSULE | Freq: Every morning | ORAL | 1 refills | Status: DC

## 2021-11-22 ENCOUNTER — Other Ambulatory Visit: Payer: Self-pay | Admitting: Student

## 2021-11-23 NOTE — Telephone Encounter (Signed)
Patient made aware that she will need to schedule appointment for further refills. Appointment scheduled for Tuesday, 01/30/22 @ 2:00 pm with Finis Bud, NP at the [facility] office.

## 2021-12-19 ENCOUNTER — Ambulatory Visit: Payer: Medicare HMO | Attending: Family Medicine | Admitting: *Deleted

## 2021-12-19 DIAGNOSIS — Z5181 Encounter for therapeutic drug level monitoring: Secondary | ICD-10-CM | POA: Diagnosis not present

## 2021-12-19 DIAGNOSIS — I4891 Unspecified atrial fibrillation: Secondary | ICD-10-CM

## 2021-12-19 LAB — POCT INR: INR: 2 (ref 2.0–3.0)

## 2021-12-19 NOTE — Patient Instructions (Signed)
Continue warfarin 1 tablet daily except 1/2 tablet on Sundays and Wednesdays  Recheck in 7 weeks.

## 2021-12-21 DIAGNOSIS — K1379 Other lesions of oral mucosa: Secondary | ICD-10-CM | POA: Diagnosis not present

## 2021-12-21 DIAGNOSIS — Z299 Encounter for prophylactic measures, unspecified: Secondary | ICD-10-CM | POA: Diagnosis not present

## 2021-12-21 DIAGNOSIS — E1165 Type 2 diabetes mellitus with hyperglycemia: Secondary | ICD-10-CM | POA: Diagnosis not present

## 2021-12-21 DIAGNOSIS — I10 Essential (primary) hypertension: Secondary | ICD-10-CM | POA: Diagnosis not present

## 2021-12-23 ENCOUNTER — Other Ambulatory Visit: Payer: Self-pay | Admitting: Student

## 2022-01-22 ENCOUNTER — Other Ambulatory Visit: Payer: Self-pay | Admitting: Student

## 2022-01-30 ENCOUNTER — Encounter: Payer: Self-pay | Admitting: Nurse Practitioner

## 2022-01-30 ENCOUNTER — Ambulatory Visit (INDEPENDENT_AMBULATORY_CARE_PROVIDER_SITE_OTHER): Payer: Medicare HMO | Admitting: *Deleted

## 2022-01-30 ENCOUNTER — Ambulatory Visit: Payer: Medicare HMO | Attending: Nurse Practitioner | Admitting: Nurse Practitioner

## 2022-01-30 VITALS — BP 128/72 | HR 61 | Ht 64.0 in | Wt 144.6 lb

## 2022-01-30 DIAGNOSIS — I10 Essential (primary) hypertension: Secondary | ICD-10-CM | POA: Diagnosis not present

## 2022-01-30 DIAGNOSIS — I4821 Permanent atrial fibrillation: Secondary | ICD-10-CM | POA: Diagnosis not present

## 2022-01-30 DIAGNOSIS — I340 Nonrheumatic mitral (valve) insufficiency: Secondary | ICD-10-CM

## 2022-01-30 DIAGNOSIS — Z79899 Other long term (current) drug therapy: Secondary | ICD-10-CM | POA: Diagnosis not present

## 2022-01-30 DIAGNOSIS — I4891 Unspecified atrial fibrillation: Secondary | ICD-10-CM

## 2022-01-30 DIAGNOSIS — Z5181 Encounter for therapeutic drug level monitoring: Secondary | ICD-10-CM

## 2022-01-30 DIAGNOSIS — I071 Rheumatic tricuspid insufficiency: Secondary | ICD-10-CM | POA: Diagnosis not present

## 2022-01-30 DIAGNOSIS — I428 Other cardiomyopathies: Secondary | ICD-10-CM

## 2022-01-30 DIAGNOSIS — Z7901 Long term (current) use of anticoagulants: Secondary | ICD-10-CM | POA: Diagnosis not present

## 2022-01-30 LAB — POCT INR: INR: 2.2 (ref 2.0–3.0)

## 2022-01-30 NOTE — Progress Notes (Unsigned)
Cardiology Office Note:    Date: 01/30/2022  ID:  Lori David, DOB 02-19-1935, MRN [id number]  PCP:  Glenda Chroman, MD   [facility] Cardiologist:  Chalmers Guest, MD     Referring MD: Glenda Chroman, MD   CC: Here for follow-up  History of Present Illness:    Lori David is a 87 y.o. female with a hx of the following:  Permanent A-fib HTN GERD NICM Valvular insufficiency  Patient is a 87 year old female with past medical history as mentioned above.  She is a previous patient of Dr. Bronson Ing.  NST in 2016 was negative for any ischemia, EF 51%.  Echocardiogram in 2016 revealed EF 40 to 45%, global hypokinesis and a corner septal wall motion of left ventricle, moderate MR, there was increased thickness of atrial septum that was consistent with lipomatous hypertrophy, no other significant valvular abnormalities.  Repeat echocardiogram in 2017 revealed EF improved to 50 to 55%, mildly calcified aortic valve without stenosis, moderate MR with mild TR, severely dilated left atrium, no other significant valvular abnormalities.  Last documented progress note by Levell July, NP dated September 23, 2020.  Prior to this office visit, she had tested positive for COVID and had mild symptoms.  EKG in office that day revealed A-fib, rate controlled at 63 bpm with ST abnormality.  Overall denied any acute cardiac complaints or issues.  Was told to follow-up in 1 year.  Today she presents for follow-up appointment.  She states she is doing well. Denies any chest pain, palpitations, syncope, presyncope, dizziness, orthopnea, PND, swelling, significant weight changes, acute bleeding, or claudication. Does admit to occasional, stable SHOB at times, not bothersome per her report. Denies any other questions or concerns today.   Past Medical History:  Diagnosis Date   Arthritis    "in my back/gastro dr" (03/22/2014)   Atrial flutter ([place])    a. Pt reports episode 02/2013 noted  pre-op cataracts but was in NSR at time of f/u. Saw cardiology and was told she had a leaky heart valve.   Family history of adverse reaction to anesthesia    "some people in my family have; don't know what happened though" (03/22/2014)   GERD (gastroesophageal reflux disease)    GI problem    a. Pt reports workup for abdominal sensitivity with unremarkable endoscopy and CT.   Heart valve disease    leaking heart valve   Hypercholesterolemia    Hypertension    Osteopenia    Seasonal allergies    Valvular heart disease     Past Surgical History:  Procedure Laterality Date   BACTERIAL OVERGROWTH TEST N/A 08/13/2014   Procedure: BACTERIAL OVERGROWTH TEST;  Surgeon: Rogene Houston, MD;  [facility];  Service: Endoscopy;  Laterality: N/A;  730   CATARACT EXTRACTION W/ INTRAOCULAR LENS  IMPLANT, BILATERAL Bilateral 2015   CATARACT EXTRACTION W/PHACO Right 06/30/2013   Procedure: CATARACT EXTRACTION PHACO AND INTRAOCULAR LENS PLACEMENT ([place]);  Surgeon: Elta Guadeloupe T. Gershon Crane, MD;  Location: [facility];  Service: Ophthalmology;  Laterality: Right;  CDE:7.99   CATARACT EXTRACTION W/PHACO Left 07/14/2013   Procedure: CATARACT EXTRACTION PHACO AND INTRAOCULAR LENS PLACEMENT ([facility]);  Surgeon: Elta Guadeloupe T. Gershon Crane, MD;  Location: [facility];  Service: Ophthalmology;  Laterality: Left;  CDE:  8.25   CHOLECYSTECTOMY  2000?   [facility]   ESOPHAGOGASTRODUODENOSCOPY N/A 01/20/2014   Procedure: ESOPHAGOGASTRODUODENOSCOPY (EGD);  Surgeon: Rogene Houston, MD;  [facility]: [facility];  Service: Endoscopy;  Laterality: N/A;  120 - moved to 12:25 - [name] to notify pt   LAPAROSCOPIC CHOLECYSTECTOMY  ~ [facility]-no longer present   TONSILLECTOMY  ~ 1943    Current Medications: Current Meds  Medication Sig   acetaminophen (TYLENOL) 650 MG CR tablet Take 650 mg by mouth every 8 (eight) hours as needed for pain.   calcium carbonate (TUMS - DOSED IN MG ELEMENTAL CALCIUM) 500 MG  chewable tablet Chew 1 tablet by mouth daily as needed for indigestion or heartburn.   Cyanocobalamin (VITAMIN B-12 ER PO) Take 1 tablet by mouth daily.   digoxin (LANOXIN) 0.125 MG tablet TAKE 1 TABLET ONCE DAILY.   diltiazem (CARDIZEM CD) 120 MG 24 hr capsule TAKE 1 CAPSULE IN THE EVENING.   diltiazem (CARDIZEM CD) 240 MG 24 hr capsule Take 1 capsule (240 mg total) by mouth every morning.   metFORMIN (GLUCOPHAGE) 500 MG tablet Take 500 mg by mouth daily with breakfast.   metoprolol tartrate (LOPRESSOR) 50 MG tablet TAKE (1) TABLET TWICE DAILY.   sennosides-docusate sodium (SENOKOT-S) 8.6-50 MG tablet Take 1 tablet by mouth 2 (two) times daily.   Vitamin D, Ergocalciferol, (DRISDOL) 50000 UNITS CAPS capsule Take 50,000 Units by mouth every 7 (seven) days. Mondays   warfarin (COUMADIN) 5 MG tablet TAKE 1 TABLET EVERY DAY EXCEPT ON SUNDAYS AND WEDNESDAYS TAKE 1/2 TABLET.     Allergies:   Chocolate, Chocolate, Peanuts [peanut oil], Peanuts [peanut oil], Penicillins, and Penicillins   Social History   Socioeconomic History   Marital status: Single    Spouse name: Not on file   Number of children: Not on file   Years of education: Not on file   Highest education level: Not on file  Occupational History   Not on file  Tobacco Use   Smoking status: Former    Packs/day: 0.75    Years: 15.00    Total pack years: 11.25    Types: Cigarettes    Quit date: 03/18/1974    Years since quitting: 47.9   Smokeless tobacco: Never   Tobacco comments:    "quit smoking cigarettes in the 1970's"  Substance and Sexual Activity   Alcohol use: No    Alcohol/week: 0.0 standard drinks of alcohol   Drug use: No   Sexual activity: Never  Other Topics Concern   Not on file  Social History Narrative   ** Merged History Encounter **       Social Determinants of Health   Financial Resource Strain: Not on file  Food Insecurity: Not on file  Transportation Needs: Not on file  Physical Activity: Not on  file  Stress: Not on file  Social Connections: Not on file     Family History: The patient's family history includes Asthma in an other family member; Atrial fibrillation in her sister; CAD in her brother, brother, father, and mother; Diabetes in an other family member; Emphysema in her father; Heart disease in her sister.  ROS:   Review of Systems  Constitutional: Negative.   HENT: Negative.    Eyes: Negative.   Respiratory:  Positive for shortness of breath. Negative for cough, hemoptysis, sputum production and wheezing.        See HPI.   Cardiovascular: Negative.   Gastrointestinal: Negative.   Genitourinary: Negative.   Musculoskeletal: Negative.   Skin: Negative.   Neurological: Negative.   Endo/Heme/Allergies: Negative.   Psychiatric/Behavioral: Negative.      Please see  the history of present illness.    All other systems reviewed and are negative.  EKGs/Labs/Other Studies Reviewed:    The following studies were reviewed today:   EKG:  EKG is ordered today.  The ekg ordered today demonstrates A-fib, 71 bpm, with nonspecific ST wave abnormality, otherwise nothing acute.   2D Echocardiogram on 02/16/2016: - Left ventricle: The cavity size was normal. Wall thickness was    normal. Systolic function was normal. The estimated ejection    fraction was in the range of 50% to 55%. Wall motion was normal;    there were no regional wall motion abnormalities. The study is    not technically sufficient to allow evaluation of LV diastolic    function.  - Aortic valve: Trileaflet; mildly calcified leaflets. There was no    stenosis.  - Mitral valve: There was moderate regurgitation.  - Left atrium: The atrium was severely dilated.  - Tricuspid valve: There was mild regurgitation.   Lexiscan on 03/23/2014: Normal stress nuclear study with no chest pain, no ST changes and no evidence of ischemia or infarction in any vascular territory. The gated ejection fraction was 51% and  the wall motion was normal.  Recent Labs: No results found for requested labs within last 365 days.  Recent Lipid Panel    Component Value Date/Time   CHOL 138 03/23/2014 0442   TRIG 99 03/23/2014 0442   HDL 40 03/23/2014 0442   CHOLHDL 3.5 03/23/2014 0442   VLDL 20 03/23/2014 0442   LDLCALC 78 03/23/2014 0442     Risk Assessment/Calculations:    CHA2DS2-VASc Score = 5  This indicates a 7.2% annual risk of stroke. The patient's score is based upon: CHF History: 1 HTN History: 1 Diabetes History: 0 Stroke History: 0 Vascular Disease History: 0 Age Score: 2 Gender Score: 1    Physical Exam:    VS:  BP 128/72   Pulse 61   Ht '5\' 4"'$  (1.626 m)   Wt 144 lb 9.6 oz (65.6 kg)   SpO2 97%   BMI 24.82 kg/m     Wt Readings from Last 3 Encounters:  01/30/22 144 lb 9.6 oz (65.6 kg)  09/23/20 141 lb 3.2 oz (64 kg)  09/29/19 139 lb 9.6 oz (63.3 kg)     GEN: Thin 87 y.o. female in no acute distress HEENT: Normal NECK: No JVD; No carotid bruits CARDIAC: S1/S2, RRR, no murmurs, rubs, gallops; 2+ peripheral pulses throughout, strong and equal bilaterally RESPIRATORY:  Clear and diminished to auscultation without rales, wheezing or rhonchi  MUSCULOSKELETAL:  No edema; No deformity  SKIN: Warm and dry NEUROLOGIC:  Alert and oriented x 3 PSYCHIATRIC:  Normal affect   ASSESSMENT:    1. Permanent atrial fibrillation ([place])   2. Current use of long term anticoagulation   3. Encounter for monitoring digoxin therapy   4. Medication management   5. Nonischemic cardiomyopathy ([place])   6. Moderate mitral regurgitation   7. Mild tricuspid regurgitation   8. Essential hypertension, benign    PLAN:    In order of problems listed above:  Permanent A-fib, long term anticoagulation, digoxin therapy monitoring, medication management Denies any palpitations or tachycardia.  EKG today reveals A-fib, rate controlled. She is due for repeat BMET and Digoxin level to be checked and will  arrange this.  Currently tolerating medications well.  Continue digoxin, Cardizem, Lopressor, and Coumadin.  CHA2DS2-VASc score 5.  Continue to follow-up with Coumadin clinic.  2. History of nonischemic cardiomyopathy  EF mildly reduced in 2016 at 40-45%, with improvement in EF in 2017 with EF 50-55%. No RWMA's noted on last Echo. She currently declines repeating Echocardiogram at this time, see below. Mild, chronic SHOB intermittently, not bothersome per her report. Euvolemic and well compensated on exam. Continue current medication regimen. Heart healthy diet, low salt diet and regular cardiovascular exercise encouraged.   3. Moderate mitral valve regurgitation, mild TR Echocardiogram in 2017 revealed moderate MR with mild TR. She is currently asymptomatic. According to current guidelines, she is due for updated Echocardiogram, however she declines this at this office visit. Plan to discuss with Dr. Dellia Cloud at next office visit. Heart healthy diet and regular cardiovascular exercise encouraged.   4. HTN BP today 128/72. BP well controlled at home. Continue diltiazem and lopressor. Heart healthy diet and regular cardiovascular exercise encouraged. Will obtain BMET as mentioned above.   5. Disposition: Establish care with Dr. Dellia Cloud and follow-up in 6 months or sooner if anything changes.    Medication Adjustments/Labs and Tests Ordered: Current medicines are reviewed at length with the patient today.  Concerns regarding medicines are outlined above.  Orders Placed This Encounter  Procedures   Basic metabolic panel   Digoxin level   EKG 12-Lead   No orders of the defined types were placed in this encounter.   Patient Instructions  Medication Instructions:  Continue all current medications.  Labwork: BMET, Digoxin - orders given today Office will contact with results via phone, letter or mychart.     Testing/Procedures: none  Follow-Up: 6 months   Any Other Special  Instructions Will Be Listed Below (If Applicable).   If you need a refill on your cardiac medications before your next appointment, please call your pharmacy.    Signed, Finis Bud, NP  01/31/2022 2:07 PM    [facility]

## 2022-01-30 NOTE — Patient Instructions (Signed)
Continue warfarin 1 tablet daily except 1/2 tablet on Sundays and Wednesdays  Recheck in 7 weeks.

## 2022-01-30 NOTE — Patient Instructions (Signed)
Medication Instructions:  Continue all current medications.  Labwork: BMET, Digoxin - orders given today Office will contact with results via phone, letter or mychart.     Testing/Procedures: none  Follow-Up: 6 months   Any Other Special Instructions Will Be Listed Below (If Applicable).   If you need a refill on your cardiac medications before your next appointment, please call your pharmacy.

## 2022-02-16 DIAGNOSIS — J019 Acute sinusitis, unspecified: Secondary | ICD-10-CM | POA: Diagnosis not present

## 2022-02-16 DIAGNOSIS — I10 Essential (primary) hypertension: Secondary | ICD-10-CM | POA: Diagnosis not present

## 2022-02-21 ENCOUNTER — Other Ambulatory Visit: Payer: Self-pay | Admitting: Student

## 2022-03-20 ENCOUNTER — Ambulatory Visit: Payer: Medicare HMO | Attending: Cardiology | Admitting: *Deleted

## 2022-03-20 DIAGNOSIS — Z5181 Encounter for therapeutic drug level monitoring: Secondary | ICD-10-CM | POA: Diagnosis not present

## 2022-03-20 DIAGNOSIS — I4891 Unspecified atrial fibrillation: Secondary | ICD-10-CM | POA: Diagnosis not present

## 2022-03-20 LAB — POCT INR: INR: 1.8 — AB (ref 2.0–3.0)

## 2022-03-20 NOTE — Patient Instructions (Signed)
Take warfarin 1 1/2 tablets tonight then resume 1 tablet daily except 1/2 tablet on Sundays and Wednesdays  Recheck in 4 weeks.

## 2022-03-29 ENCOUNTER — Other Ambulatory Visit: Payer: Self-pay | Admitting: Student

## 2022-03-29 ENCOUNTER — Other Ambulatory Visit: Payer: Self-pay | Admitting: Cardiology

## 2022-03-29 NOTE — Telephone Encounter (Signed)
Request for Warfarin refill:  Last INR was 1.8 on 03/20/22 Next INR due 04/17/22 LOV was 01/30/22  Refill approved.

## 2022-04-17 ENCOUNTER — Ambulatory Visit: Payer: Medicare HMO | Attending: Cardiology | Admitting: Pharmacist

## 2022-04-17 DIAGNOSIS — Z5181 Encounter for therapeutic drug level monitoring: Secondary | ICD-10-CM | POA: Diagnosis not present

## 2022-04-17 DIAGNOSIS — I4891 Unspecified atrial fibrillation: Secondary | ICD-10-CM | POA: Diagnosis not present

## 2022-04-17 LAB — POCT INR: INR: 1.9 — AB (ref 2.0–3.0)

## 2022-04-17 NOTE — Patient Instructions (Signed)
Description   Start taking 1 tablet daily except 1/2 tablet only on Sundays Recheck in 3 weeks.

## 2022-04-27 ENCOUNTER — Other Ambulatory Visit: Payer: Self-pay | Admitting: Student

## 2022-05-10 ENCOUNTER — Ambulatory Visit: Payer: Medicare HMO | Attending: Cardiology

## 2022-05-10 DIAGNOSIS — I4891 Unspecified atrial fibrillation: Secondary | ICD-10-CM

## 2022-05-10 DIAGNOSIS — Z5181 Encounter for therapeutic drug level monitoring: Secondary | ICD-10-CM

## 2022-05-10 LAB — POCT INR: INR: 2 (ref 2.0–3.0)

## 2022-05-10 NOTE — Patient Instructions (Signed)
Description   Continue on same dosage of Warfarin 1 tablet daily except 1/2 tablet only on Sundays Recheck in 4 weeks.

## 2022-06-07 ENCOUNTER — Ambulatory Visit: Payer: Medicare HMO | Attending: Internal Medicine | Admitting: *Deleted

## 2022-06-07 DIAGNOSIS — Z5181 Encounter for therapeutic drug level monitoring: Secondary | ICD-10-CM

## 2022-06-07 DIAGNOSIS — I4891 Unspecified atrial fibrillation: Secondary | ICD-10-CM

## 2022-06-07 LAB — POCT INR: INR: 2.6 (ref 2.0–3.0)

## 2022-06-07 NOTE — Patient Instructions (Signed)
Continue on same dosage of Warfarin 1 tablet daily except 1/2 tablet only on Sundays Recheck in 6 weeks.

## 2022-06-22 ENCOUNTER — Other Ambulatory Visit: Payer: Self-pay | Admitting: Student

## 2022-07-19 ENCOUNTER — Ambulatory Visit: Payer: Medicare HMO | Attending: Internal Medicine | Admitting: *Deleted

## 2022-07-19 DIAGNOSIS — Z5181 Encounter for therapeutic drug level monitoring: Secondary | ICD-10-CM | POA: Diagnosis not present

## 2022-07-19 DIAGNOSIS — I4891 Unspecified atrial fibrillation: Secondary | ICD-10-CM

## 2022-07-19 LAB — POCT INR: POC INR: 2.7

## 2022-07-19 NOTE — Patient Instructions (Signed)
Description   Continue on same dosage of Warfarin 1 tablet daily except 1/2 tablet only on Sundays Recheck in 6 weeks.

## 2022-07-20 ENCOUNTER — Other Ambulatory Visit: Payer: Self-pay | Admitting: Cardiology

## 2022-07-27 DIAGNOSIS — I712 Thoracic aortic aneurysm, without rupture, unspecified: Secondary | ICD-10-CM | POA: Diagnosis not present

## 2022-07-27 DIAGNOSIS — I4891 Unspecified atrial fibrillation: Secondary | ICD-10-CM | POA: Diagnosis not present

## 2022-07-27 DIAGNOSIS — J984 Other disorders of lung: Secondary | ICD-10-CM | POA: Diagnosis not present

## 2022-07-27 DIAGNOSIS — R0789 Other chest pain: Secondary | ICD-10-CM | POA: Diagnosis not present

## 2022-07-27 DIAGNOSIS — M549 Dorsalgia, unspecified: Secondary | ICD-10-CM | POA: Diagnosis not present

## 2022-07-27 DIAGNOSIS — I471 Supraventricular tachycardia, unspecified: Secondary | ICD-10-CM | POA: Diagnosis not present

## 2022-07-27 DIAGNOSIS — M546 Pain in thoracic spine: Secondary | ICD-10-CM | POA: Diagnosis not present

## 2022-07-27 DIAGNOSIS — J9811 Atelectasis: Secondary | ICD-10-CM | POA: Diagnosis not present

## 2022-07-27 DIAGNOSIS — Q2546 Tortuous aortic arch: Secondary | ICD-10-CM | POA: Diagnosis not present

## 2022-07-27 DIAGNOSIS — Z299 Encounter for prophylactic measures, unspecified: Secondary | ICD-10-CM | POA: Diagnosis not present

## 2022-07-27 DIAGNOSIS — I517 Cardiomegaly: Secondary | ICD-10-CM | POA: Diagnosis not present

## 2022-07-27 DIAGNOSIS — R5383 Other fatigue: Secondary | ICD-10-CM | POA: Diagnosis not present

## 2022-07-27 DIAGNOSIS — R079 Chest pain, unspecified: Secondary | ICD-10-CM | POA: Diagnosis not present

## 2022-07-27 DIAGNOSIS — I10 Essential (primary) hypertension: Secondary | ICD-10-CM | POA: Diagnosis not present

## 2022-07-27 DIAGNOSIS — I4821 Permanent atrial fibrillation: Secondary | ICD-10-CM | POA: Diagnosis not present

## 2022-07-30 DIAGNOSIS — E1129 Type 2 diabetes mellitus with other diabetic kidney complication: Secondary | ICD-10-CM | POA: Diagnosis not present

## 2022-07-30 DIAGNOSIS — R079 Chest pain, unspecified: Secondary | ICD-10-CM | POA: Diagnosis not present

## 2022-07-30 DIAGNOSIS — I700 Atherosclerosis of aorta: Secondary | ICD-10-CM | POA: Diagnosis not present

## 2022-07-30 DIAGNOSIS — R911 Solitary pulmonary nodule: Secondary | ICD-10-CM | POA: Diagnosis not present

## 2022-07-30 DIAGNOSIS — I428 Other cardiomyopathies: Secondary | ICD-10-CM | POA: Diagnosis not present

## 2022-07-30 DIAGNOSIS — R809 Proteinuria, unspecified: Secondary | ICD-10-CM | POA: Diagnosis not present

## 2022-07-30 DIAGNOSIS — E041 Nontoxic single thyroid nodule: Secondary | ICD-10-CM | POA: Diagnosis not present

## 2022-07-30 DIAGNOSIS — R918 Other nonspecific abnormal finding of lung field: Secondary | ICD-10-CM | POA: Diagnosis not present

## 2022-07-31 DIAGNOSIS — R911 Solitary pulmonary nodule: Secondary | ICD-10-CM | POA: Diagnosis not present

## 2022-07-31 DIAGNOSIS — E041 Nontoxic single thyroid nodule: Secondary | ICD-10-CM | POA: Diagnosis not present

## 2022-08-06 DIAGNOSIS — I4821 Permanent atrial fibrillation: Secondary | ICD-10-CM | POA: Diagnosis not present

## 2022-08-07 ENCOUNTER — Ambulatory Visit: Payer: Medicare HMO | Attending: Internal Medicine | Admitting: Internal Medicine

## 2022-08-07 ENCOUNTER — Encounter: Payer: Self-pay | Admitting: Internal Medicine

## 2022-08-07 VITALS — BP 134/86 | HR 68 | Ht 63.0 in | Wt 139.4 lb

## 2022-08-07 DIAGNOSIS — I4891 Unspecified atrial fibrillation: Secondary | ICD-10-CM | POA: Diagnosis not present

## 2022-08-07 DIAGNOSIS — I5032 Chronic diastolic (congestive) heart failure: Secondary | ICD-10-CM | POA: Diagnosis not present

## 2022-08-07 DIAGNOSIS — I340 Nonrheumatic mitral (valve) insufficiency: Secondary | ICD-10-CM

## 2022-08-07 NOTE — Progress Notes (Signed)
Cardiology Office Note  Date: 08/07/2022   ID: Saffire Paskins, DOB [birth year], MRN [id number]  PCP:  Ignatius Specking, MD  Cardiologist:  Marjo Bicker, MD Electrophysiologist:  None   Reason for Office Visit: No chief complaint on file.   History of Present Illness: Lori David is a 88 y.o. female  Past Medical History:  Diagnosis Date   Arthritis    "in my back/gastro dr" (03/22/2014)   Atrial flutter ([facility])    a. Pt reports episode 02/2013 noted pre-op cataracts but was in NSR at time of f/u. Saw cardiology and was told she had a leaky heart valve.   Family history of adverse reaction to anesthesia    "some people in my family have; don't know what happened though" (03/22/2014)   GERD (gastroesophageal reflux disease)    GI problem    a. Pt reports workup for abdominal sensitivity with unremarkable endoscopy and CT.   Heart valve disease    leaking heart valve   Hypercholesterolemia    Hypertension    Osteopenia    Seasonal allergies    Valvular heart disease     Past Surgical History:  Procedure Laterality Date   BACTERIAL OVERGROWTH TEST N/A 08/13/2014   Procedure: BACTERIAL OVERGROWTH TEST;  Surgeon: Malissa Hippo, MD;  [facility];  Service: Endoscopy;  Laterality: N/A;  730   CATARACT EXTRACTION W/ INTRAOCULAR LENS  IMPLANT, BILATERAL Bilateral 2015   CATARACT EXTRACTION W/PHACO Right 06/30/2013   Procedure: CATARACT EXTRACTION PHACO AND INTRAOCULAR LENS PLACEMENT ([facility]);  Surgeon: Loraine Leriche T. Nile Riggs, MD;  Location: [facility];  Service: Ophthalmology;  Laterality: Right;  CDE:7.99   CATARACT EXTRACTION W/PHACO Left 07/14/2013   Procedure: CATARACT EXTRACTION PHACO AND INTRAOCULAR LENS PLACEMENT ([facility]);  Surgeon: Loraine Leriche T. Nile Riggs, MD;  Location: [facility];  Service: Ophthalmology;  Laterality: Left;  CDE:  8.25   CHOLECYSTECTOMY  2000?   [facility]   ESOPHAGOGASTRODUODENOSCOPY N/A 01/20/2014   Procedure: ESOPHAGOGASTRODUODENOSCOPY (EGD);  Surgeon: Malissa Hippo, MD;  Location: [facility];  Service: Endoscopy;  Laterality: N/A;  120 - moved to 12:25 - [name] to notify pt   LAPAROSCOPIC CHOLECYSTECTOMY  ~ 2000   TONSILLECTOMY  1943   [facility]-no longer present   TONSILLECTOMY  ~ 1943    Current Outpatient Medications  Medication Sig Dispense Refill   acetaminophen (TYLENOL) 650 MG CR tablet Take 650 mg by mouth every 8 (eight) hours as needed for pain.     calcium carbonate (TUMS - DOSED IN MG ELEMENTAL CALCIUM) 500 MG chewable tablet Chew 1 tablet by mouth daily as needed for indigestion or heartburn.     Cyanocobalamin (VITAMIN B-12 ER PO) Take 1 tablet by mouth daily.     digoxin (LANOXIN) 0.125 MG tablet TAKE 1 TABLET ONCE DAILY. 30 tablet 6   diltiazem (CARDIZEM CD) 120 MG 24 hr capsule TAKE 1 CAPSULE IN THE EVENING. 90 capsule 1   diltiazem (CARDIZEM CD) 240 MG 24 hr capsule Take 1 capsule (240 mg total) by mouth every morning. 90 capsule 1   metFORMIN (GLUCOPHAGE) 500 MG tablet Take 500 mg by mouth daily with breakfast.     metoprolol tartrate (LOPRESSOR) 50 MG tablet TAKE (1) TABLET TWICE DAILY. 60 tablet 6   sennosides-docusate sodium (SENOKOT-S) 8.6-50 MG tablet Take 1 tablet by mouth 2 (two) times daily.     Vitamin D, Ergocalciferol, (DRISDOL) 50000 UNITS CAPS capsule Take 50,000 Units by mouth every 7 (seven)  days. Mondays     warfarin (COUMADIN) 5 MG tablet TAKE 1 TABLET EVERY DAY EXCEPT ON SUNDAYS AND WEDNESDAYS TAKE 1/2 TABLET. 30 tablet 5   No current facility-administered medications for this visit.   Allergies:  Chocolate, Chocolate, Peanuts [peanut oil], Peanuts [peanut oil], Penicillins, and Penicillins   Social History: The patient  reports that she quit smoking about 48 years ago. Her smoking use included cigarettes. She has a 11.25 pack-year smoking history. She has never used smokeless tobacco. She reports that she does not drink alcohol and does not use drugs.   Family History: The patient's family history  includes Asthma in an other family member; Atrial fibrillation in her sister; CAD in her brother, brother, father, and mother; Diabetes in an other family member; Emphysema in her father; Heart disease in her sister.   ROS:  Please see the history of present illness. Otherwise, complete review of systems is positive for none  All other systems are reviewed and negative.   Physical Exam: VS:  BP 134/86   Pulse 68   Ht 5\' 3"  (1.6 m)   Wt 139 lb 6.4 oz (63.2 kg)   SpO2 97%   BMI 24.69 kg/m , BMI Body mass index is 24.69 kg/m.  Wt Readings from Last 3 Encounters:  08/07/22 139 lb 6.4 oz (63.2 kg)  01/30/22 144 lb 9.6 oz (65.6 kg)  09/23/20 141 lb 3.2 oz (64 kg)    General: Patient appears comfortable at rest. HEENT: Conjunctiva and lids normal, oropharynx clear with moist mucosa. Neck: Supple, no elevated JVP or carotid bruits, no thyromegaly. Lungs: Clear to auscultation, nonlabored breathing at rest. Cardiac: Regular rate and rhythm, no S3 or significant systolic murmur, no pericardial rub. Abdomen: Soft, nontender, no hepatomegaly, bowel sounds present, no guarding or rebound. Extremities: No pitting edema, distal pulses 2+. Skin: Warm and dry. Musculoskeletal: No kyphosis. Neuropsychiatric: Alert and oriented x3, affect grossly appropriate.  Recent Labwork: No results found for requested labs within last 365 days.     Component Value Date/Time   CHOL 138 03/23/2014 0442   TRIG 99 03/23/2014 0442   HDL 40 03/23/2014 0442   CHOLHDL 3.5 03/23/2014 0442   VLDL 20 03/23/2014 0442   LDLCALC 78 03/23/2014 0442    Other Studies Reviewed Today:   Assessment and Plan:     I have spent a total of 45 minutes with patient reviewing chart, EKGs, labs and examining patient as well as establishing an assessment and plan that was discussed with the patient.  > 50% of time was spent in direct patient care.    Medication Adjustments/Labs and Tests Ordered: Current medicines are  reviewed at length with the patient today.  Concerns regarding medicines are outlined above.   Tests Ordered: Orders Placed This Encounter  Procedures   EKG 12-Lead    Medication Changes: No orders of the defined types were placed in this encounter.   Disposition:  Follow up {follow up:15908}  Signed Lola Czerwonka Verne Spurr, MD, 08/07/2022 12:58 PM    [facility] at [facility] [address] [place], [place], [place] [address]

## 2022-08-07 NOTE — Patient Instructions (Addendum)
Medication Instructions:  Your physician has recommended you make the following change in your medication:  Stop taking Digoxin  Labwork: None  Testing/Procedures: None  Follow-Up: Your physician recommends that you schedule a follow-up appointment in: 1 year. You will receive a reminder call in the mail in about 10 months reminding you to call and schedule your appointment. If you don't receive this call, please contact our office.   Any Other Special Instructions Will Be Listed Below (If Applicable).  If you need a refill on your cardiac medications before your next appointment, please call your pharmacy.

## 2022-08-09 DIAGNOSIS — I340 Nonrheumatic mitral (valve) insufficiency: Secondary | ICD-10-CM | POA: Insufficient documentation

## 2022-08-09 DIAGNOSIS — I5032 Chronic diastolic (congestive) heart failure: Secondary | ICD-10-CM | POA: Insufficient documentation

## 2022-08-09 DIAGNOSIS — E042 Nontoxic multinodular goiter: Secondary | ICD-10-CM | POA: Diagnosis not present

## 2022-08-09 DIAGNOSIS — E041 Nontoxic single thyroid nodule: Secondary | ICD-10-CM | POA: Diagnosis not present

## 2022-08-29 ENCOUNTER — Other Ambulatory Visit: Payer: Self-pay | Admitting: Student

## 2022-08-29 ENCOUNTER — Other Ambulatory Visit: Payer: Self-pay | Admitting: Internal Medicine

## 2022-09-03 NOTE — Progress Notes (Deleted)
   Lori David, female    DOB: [birth year]    MRN: [id number]   Brief patient profile:  23  yowf  *** referred to pulmonary clinic in [place]  09/04/2022 by *** for ***      History of Present Illness  09/04/2022  Pulmonary/ 1st office eval/ [name] / [place] [facility]  No chief complaint on file.    Dyspnea:  *** Cough: *** Sleep: *** SABA use: *** 02: *** Lung cancer screen: ***   Outpatient Medications Prior to Visit  Medication Sig Dispense Refill   acetaminophen (TYLENOL) 650 MG CR tablet Take 650 mg by mouth every 8 (eight) hours as needed for pain.     calcium carbonate (TUMS - DOSED IN MG ELEMENTAL CALCIUM) 500 MG chewable tablet Chew 1 tablet by mouth daily as needed for indigestion or heartburn.     Cyanocobalamin (VITAMIN B-12 ER PO) Take 1 tablet by mouth daily.     diltiazem (CARDIZEM CD) 120 MG 24 hr capsule TAKE 1 CAPSULE IN THE EVENING. 30 capsule 2   diltiazem (CARDIZEM CD) 240 MG 24 hr capsule TAKE 1 CAPSULE BY MOUTH EVERY MORNING. 90 capsule 1   metFORMIN (GLUCOPHAGE) 500 MG tablet Take 500 mg by mouth daily with breakfast.     metoprolol tartrate (LOPRESSOR) 50 MG tablet take (1) tablet twice daily. 60 tablet 2   sennosides-docusate sodium (SENOKOT-S) 8.6-50 MG tablet Take 1 tablet by mouth 2 (two) times daily.     Vitamin D, Ergocalciferol, (DRISDOL) 50000 UNITS CAPS capsule Take 50,000 Units by mouth every 7 (seven) days. Mondays     warfarin (COUMADIN) 5 MG tablet TAKE 1 TABLET EVERY DAY EXCEPT ON SUNDAYS AND WEDNESDAYS TAKE 1/2 TABLET. 30 tablet 5   No facility-administered medications prior to visit.    Past Medical History:  Diagnosis Date   Arthritis    "in my back/gastro dr" (03/22/2014)   Atrial flutter ([facility])    a. Pt reports episode 02/2013 noted pre-op cataracts but was in NSR at time of f/u. Saw cardiology and was told she had a leaky heart valve.   Family history of adverse reaction to anesthesia    "some people in my family have; don't know  what happened though" (03/22/2014)   GERD (gastroesophageal reflux disease)    GI problem    a. Pt reports workup for abdominal sensitivity with unremarkable endoscopy and CT.   Heart valve disease    leaking heart valve   Hypercholesterolemia    Hypertension    Osteopenia    Seasonal allergies    Valvular heart disease       Objective:     There were no vitals taken for this visit.         Assessment   No problem-specific Assessment & Plan notes found for this encounter.     Sandrea Hughs, MD 09/03/2022

## 2022-09-04 ENCOUNTER — Encounter: Payer: Self-pay | Admitting: Internal Medicine

## 2022-09-04 ENCOUNTER — Institutional Professional Consult (permissible substitution): Payer: Medicare HMO | Admitting: Internal Medicine

## 2022-09-04 ENCOUNTER — Ambulatory Visit: Payer: Medicare HMO | Admitting: Internal Medicine

## 2022-09-04 VITALS — BP 123/77 | HR 80 | Ht 63.0 in | Wt 139.0 lb

## 2022-09-04 DIAGNOSIS — R911 Solitary pulmonary nodule: Secondary | ICD-10-CM | POA: Insufficient documentation

## 2022-09-04 NOTE — Patient Instructions (Signed)
My office will be contacting you by phone for referral for PET scan   - if you don't hear back from my office within one week please call us back or notify us thru MyChart and we'll address it right away.

## 2022-09-04 NOTE — Assessment & Plan Note (Addendum)
Incidental finding s/p smoking cessation 1976 - CT s contrast  07/30/22  Spiculated mixed solid and ground-glass nodule in the right lung apex, measuring 2.1 x 1.4 cm, concerning for primary bronchogenic  carcinoma. Consultation with pulmonary medicine or thoracic surgery  is suggested, as clinically appropriate. Recommend PET-CT, biopsy,  or resection.   Agree with radiology next step should be PET and if pos proceed directly to excisional bx if c/w stage 1 lung ca given pt appears to be a very good candidate for resection for cure.   Discussed in detail all the  indications, usual  risks and alternatives  relative to the benefits with patient who agrees to proceed with w/u as outlined.            Each maintenance medication was reviewed in detail including emphasizing most importantly the difference between maintenance and prns and under what circumstances the prns are to be triggered using an action plan format where appropriate.  Total time for H and P, chart review, counseling,   and generating customized AVS unique to this office visit / same day charting = 40 min new pt eval .

## 2022-09-04 NOTE — Progress Notes (Signed)
Lori David, female    DOB: 1935/01/23    MRN: [id number]   Brief patient profile:  40  yowf  quit smoking 1976 s apparent sequelae  referred to pulmonary clinic in [place]  09/04/2022 by Dr Sherril Croon  for incidentally noted RUL nodule on w/ for L lat cp ? Shingles? Which resolved w/in 4 d of starting antiviral rx       History of Present Illness  09/04/2022  Pulmonary/ 1st office eval/ Lori David / [place] [facility]  Chief Complaint  Patient presents with   Lung Nodule   Dyspnea:  Not limited by breathing from desired activities   Cough: none  Sleep: no resp cc f  SABA use: none  02: none  .No obvious day to day or daytime pattern/variability or assoc excess/ purulent sputum or mucus plugs or hemoptysis or cp or chest tightness, subjective wheeze or overt sinus or hb symptoms.   Sleeping  without nocturnal  or early am exacerbation  of respiratory  c/o's or need for noct saba. Also denies any obvious fluctuation of symptoms with weather or environmental changes or other aggravating or alleviating factors except as outlined above   No unusual exposure hx or h/o childhood pna/ asthma or knowledge of premature birth.  Current Allergies, Complete Past Medical History, Past Surgical History, Family History, and Social History were reviewed in [employer] record.  ROS  The following are not active complaints unless bolded Hoarseness, sore throat, dysphagia, dental problems, itching, sneezing,  nasal congestion or discharge of excess mucus or purulent secretions, ear ache,   fever, chills, sweats, unintended wt loss or wt gain, classically pleuritic or exertional cp,  orthopnea pnd or arm/hand swelling  or leg swelling, presyncope, palpitations, abdominal pain, anorexia, nausea, vomiting, diarrhea  or change in bowel habits or change in bladder habits, change in stools or change in urine, dysuria, hematuria,  rash, arthralgias, visual complaints, headache, numbness, weakness  or ataxia or problems with walking or coordination,  change in mood or  memory.            Outpatient Medications Prior to Visit  Medication Sig Dispense Refill   acetaminophen (TYLENOL) 650 MG CR tablet Take 650 mg by mouth every 8 (eight) hours as needed for pain.     calcium carbonate (TUMS - DOSED IN MG ELEMENTAL CALCIUM) 500 MG chewable tablet Chew 1 tablet by mouth daily as needed for indigestion or heartburn.     cyanocobalamin (VITAMIN B12) 1000 MCG/ML injection Inject 1,000 mcg into the muscle every 30 (thirty) days.     diltiazem (CARDIZEM CD) 120 MG 24 hr capsule TAKE 1 CAPSULE IN THE EVENING. 30 capsule 2   diltiazem (CARDIZEM CD) 240 MG 24 hr capsule TAKE 1 CAPSULE BY MOUTH EVERY MORNING. 90 capsule 1   metFORMIN (GLUCOPHAGE) 500 MG tablet Take 500 mg by mouth daily with breakfast.     metoprolol tartrate (LOPRESSOR) 50 MG tablet take (1) tablet twice daily. 60 tablet 2   sennosides-docusate sodium (SENOKOT-S) 8.6-50 MG tablet Take 1 tablet by mouth 2 (two) times daily.     Vitamin D, Ergocalciferol, (DRISDOL) 50000 UNITS CAPS capsule Take 50,000 Units by mouth every 7 (seven) days. Mondays     warfarin (COUMADIN) 5 MG tablet TAKE 1 TABLET EVERY DAY EXCEPT ON SUNDAYS AND WEDNESDAYS TAKE 1/2 TABLET. 30 tablet 5   Cyanocobalamin (VITAMIN B-12 ER PO) Take 1 tablet by mouth daily.     No facility-administered medications prior to  visit.    Past Medical History:  Diagnosis Date   Arthritis    "in my back/gastro dr" (03/22/2014)   Atrial flutter (HCC)    a. Pt reports episode 02/2013 noted pre-op cataracts but was in NSR at time of f/u. Saw cardiology and was told she had a leaky heart valve.   Family history of adverse reaction to anesthesia    "some people in my family have; don't know what happened though" (03/22/2014)   GERD (gastroesophageal reflux disease)    GI problem    a. Pt reports workup for abdominal sensitivity with unremarkable endoscopy and CT.   Heart valve disease     leaking heart valve   Hypercholesterolemia    Hypertension    Osteopenia    Seasonal allergies    Valvular heart disease       Objective:     BP 123/77   Pulse 80   Ht 5\' 3"  (1.6 m)   Wt 139 lb (63 kg)   SpO2 96%   BMI 24.62 kg/m   SpO2: 96 % somber amb wf nad    HEENT : Oropharynx  clear         NECK :  without  apparent JVD/ palpable Nodes/TM    LUNGS: no acc muscle use,  Nl contour chest which is clear to A and P bilaterally without cough on insp or exp maneuvers   CV: Slt  Irregular  no s3 or murmur or increase in P2, and no edema   ABD:  soft and nontender with nl inspiratory excursion in the supine position. No bruits or organomegaly appreciated   MS:  Nl gait/ ext warm without deformities Or obvious joint restrictions  calf tenderness, cyanosis or clubbing    SKIN: warm and dry without lesions    NEURO:  alert, approp, nl sensorium with  no motor or cerebellar deficits apparent.       Assessment   Solitary pulmonary nodule on lung CT Incidental finding s/p smoking cessation 1976 - CT s contrast  07/30/22  Spiculated mixed solid and ground-glass nodule in the right lung apex, measuring 2.1 x 1.4 cm, concerning for primary bronchogenic  carcinoma. Consultation with pulmonary medicine or thoracic surgery  is suggested, as clinically appropriate. Recommend PET-CT, biopsy,  or resection.   Agree with radiology next step should be PET and if pos proceed directly to excisional bx if c/w stage 1 lung ca given pt appears to be a very good candidate for resection for cure.   Discussed in detail all the  indications, usual  risks and alternatives  relative to the benefits with patient who agrees to proceed with w/u as outlined.            Each maintenance medication was reviewed in detail including emphasizing most importantly the difference between maintenance and prns and under what circumstances the prns are to be triggered using an action plan format  where appropriate.  Total time for H and P, chart review, counseling,   and generating customized AVS unique to this office visit / same day charting = 40 min new pt eval .          Sandrea Hughs, MD 09/04/2022

## 2022-09-13 ENCOUNTER — Other Ambulatory Visit (HOSPITAL_COMMUNITY): Payer: Medicare HMO

## 2022-09-13 DIAGNOSIS — E041 Nontoxic single thyroid nodule: Secondary | ICD-10-CM | POA: Diagnosis not present

## 2022-09-13 DIAGNOSIS — Z7901 Long term (current) use of anticoagulants: Secondary | ICD-10-CM | POA: Diagnosis not present

## 2022-09-14 ENCOUNTER — Other Ambulatory Visit (HOSPITAL_COMMUNITY): Payer: Self-pay | Admitting: Otolaryngology

## 2022-09-14 DIAGNOSIS — E041 Nontoxic single thyroid nodule: Secondary | ICD-10-CM

## 2022-09-17 ENCOUNTER — Telehealth: Payer: Self-pay | Admitting: Internal Medicine

## 2022-09-17 DIAGNOSIS — R0602 Shortness of breath: Secondary | ICD-10-CM

## 2022-09-17 DIAGNOSIS — Z5181 Encounter for therapeutic drug level monitoring: Secondary | ICD-10-CM

## 2022-09-17 MED ORDER — DIGOXIN 125 MCG PO TABS: 0.1250 mg | ORAL_TABLET | Freq: Every day | ORAL | Status: DC

## 2022-09-17 NOTE — Telephone Encounter (Signed)
Patient notified and verbalized understanding. Pt will continue Digoxin 0.125 mg tablets once daily, update echo, obtain Digoxin level, and have a f/u visit with provider in 2-3 weeks. Pt had no further questions or concerns at this time.   Scheduling, please schedule appt for pt- Echo/ 2-3w f/u EP. Pt has specific days in which she is able to come.

## 2022-09-17 NOTE — Telephone Encounter (Signed)
Pt c/o Shortness Of Breath: STAT if SOB developed within the last 24 hours or pt is noticeably SOB on the phone  1. Are you currently SOB (can you hear that pt is SOB on the phone)? No  2. How long have you been experiencing SOB? 2 weeks (off and on) 3. Are you SOB when sitting or when up moving around? both 4.  Are you currently experiencing any other symptoms? No   Patient is having a scan on Thursday for a spot that was found on her lung.   Patient states that she was taken off one of her medications but she went back and took 2 pills due to the shortness of breath.

## 2022-09-17 NOTE — Telephone Encounter (Signed)
Patient stated she has been having SOB for the last 1-2 weeks.  Noticeable with activity and rest.  Pt is having a PET scan- ordered by pulmonology- Dr. [name] Pt denies n/v, cp.   Pt stated that she was taken off of Digoxin 125 mcg at last office visit, but restarted on her own- pt stated that she restarted because she was having sob. Pt stated she took 2 tablets- one in the am, one in the pm.   Pt stated she is compliant with all other medications.   O2 stat by pulse ox- 98% HR- 80 bpm  Pt stated she is not currently having any SOB in office.   Pt advised ER precautions for SOB.

## 2022-09-20 ENCOUNTER — Encounter (HOSPITAL_COMMUNITY)
Admission: RE | Admit: 2022-09-20 | Discharge: 2022-09-20 | Disposition: A | Discharge status: Home / Self Care | Payer: Medicare HMO | Source: Ambulatory Visit | Attending: Internal Medicine | Admitting: Internal Medicine

## 2022-09-20 DIAGNOSIS — R911 Solitary pulmonary nodule: Secondary | ICD-10-CM | POA: Insufficient documentation

## 2022-09-20 MED ORDER — FLUDEOXYGLUCOSE F - 18 (FDG) INJECTION
7.2000 | Freq: Once | INTRAVENOUS | Status: AC | PRN
  Administered 2022-09-20: 7.2 via INTRAVENOUS

## 2022-09-25 ENCOUNTER — Ambulatory Visit: Payer: Medicare HMO

## 2022-09-25 DIAGNOSIS — R0602 Shortness of breath: Secondary | ICD-10-CM

## 2022-09-27 ENCOUNTER — Ambulatory Visit (HOSPITAL_COMMUNITY): Admission: RE | Admit: 2022-09-27 | Payer: Medicare HMO | Source: Ambulatory Visit

## 2022-10-01 ENCOUNTER — Other Ambulatory Visit: Payer: Self-pay | Admitting: Cardiology

## 2022-10-01 NOTE — Telephone Encounter (Signed)
Refill request for warfarin.  Last INR was 2.7 on 07/19/22 Next INR was 08/30/22 (Past Due) LOV was 08/07/22  Message sent to schedulers to make INR appt. Refill approved x 1.

## 2022-10-03 ENCOUNTER — Other Ambulatory Visit (HOSPITAL_COMMUNITY): Payer: Medicare HMO

## 2022-10-04 ENCOUNTER — Ambulatory Visit (HOSPITAL_COMMUNITY): Admission: RE | Admit: 2022-10-04 | Payer: Medicare HMO | Source: Ambulatory Visit

## 2022-10-10 ENCOUNTER — Telehealth: Payer: Self-pay | Admitting: Internal Medicine

## 2022-10-10 DIAGNOSIS — Z1331 Encounter for screening for depression: Secondary | ICD-10-CM | POA: Diagnosis not present

## 2022-10-10 DIAGNOSIS — I10 Essential (primary) hypertension: Secondary | ICD-10-CM | POA: Diagnosis not present

## 2022-10-10 DIAGNOSIS — E7800 Pure hypercholesterolemia, unspecified: Secondary | ICD-10-CM | POA: Diagnosis not present

## 2022-10-10 DIAGNOSIS — Z1339 Encounter for screening examination for other mental health and behavioral disorders: Secondary | ICD-10-CM | POA: Diagnosis not present

## 2022-10-10 DIAGNOSIS — Z79899 Other long term (current) drug therapy: Secondary | ICD-10-CM | POA: Diagnosis not present

## 2022-10-10 DIAGNOSIS — R5383 Other fatigue: Secondary | ICD-10-CM | POA: Diagnosis not present

## 2022-10-10 DIAGNOSIS — Z7189 Other specified counseling: Secondary | ICD-10-CM | POA: Diagnosis not present

## 2022-10-10 DIAGNOSIS — Z0000 Encounter for general adult medical examination without abnormal findings: Secondary | ICD-10-CM | POA: Diagnosis not present

## 2022-10-10 DIAGNOSIS — Z299 Encounter for prophylactic measures, unspecified: Secondary | ICD-10-CM | POA: Diagnosis not present

## 2022-10-10 NOTE — Telephone Encounter (Signed)
Dr.s office calling to try to sched a lung biopsy. Please call PT to advise.

## 2022-10-11 ENCOUNTER — Encounter (HOSPITAL_COMMUNITY): Payer: Self-pay

## 2022-10-11 ENCOUNTER — Ambulatory Visit (HOSPITAL_COMMUNITY): Admission: RE | Admit: 2022-10-11 | Payer: Medicare HMO | Source: Ambulatory Visit

## 2022-10-11 DIAGNOSIS — E7800 Pure hypercholesterolemia, unspecified: Secondary | ICD-10-CM | POA: Diagnosis not present

## 2022-10-11 DIAGNOSIS — E559 Vitamin D deficiency, unspecified: Secondary | ICD-10-CM | POA: Diagnosis not present

## 2022-10-11 DIAGNOSIS — E538 Deficiency of other specified B group vitamins: Secondary | ICD-10-CM | POA: Diagnosis not present

## 2022-10-11 DIAGNOSIS — E1165 Type 2 diabetes mellitus with hyperglycemia: Secondary | ICD-10-CM | POA: Diagnosis not present

## 2022-10-11 DIAGNOSIS — Z79899 Other long term (current) drug therapy: Secondary | ICD-10-CM | POA: Diagnosis not present

## 2022-10-11 DIAGNOSIS — R5383 Other fatigue: Secondary | ICD-10-CM | POA: Diagnosis not present

## 2022-10-11 NOTE — Telephone Encounter (Signed)
I already discussed this with pt and she wanted to wait  - if she wants to proceed with bx I would like to have Dr Tonia Brooms in the office 1st as the case is not straightforward given that the PET is negative

## 2022-10-12 NOTE — Telephone Encounter (Signed)
I spoke with PT and discussed Dr. Thurston Hole comments. She said she is now undecided about the bx and she would talk to her other Dr. About this again. Thank you.

## 2022-10-17 ENCOUNTER — Other Ambulatory Visit (HOSPITAL_COMMUNITY): Payer: Self-pay | Admitting: Otolaryngology

## 2022-10-17 ENCOUNTER — Other Ambulatory Visit: Payer: Self-pay | Admitting: Interventional Radiology

## 2022-10-17 ENCOUNTER — Encounter (HOSPITAL_COMMUNITY): Payer: Self-pay

## 2022-10-17 ENCOUNTER — Ambulatory Visit (HOSPITAL_COMMUNITY)
Admission: RE | Admit: 2022-10-17 | Discharge: 2022-10-17 | Disposition: A | Discharge status: Home / Self Care | Payer: Medicare HMO | Source: Ambulatory Visit | Attending: Otolaryngology | Admitting: Otolaryngology

## 2022-10-17 DIAGNOSIS — E042 Nontoxic multinodular goiter: Secondary | ICD-10-CM | POA: Diagnosis not present

## 2022-10-17 DIAGNOSIS — E041 Nontoxic single thyroid nodule: Secondary | ICD-10-CM

## 2022-10-17 DIAGNOSIS — E0789 Other specified disorders of thyroid: Secondary | ICD-10-CM | POA: Diagnosis not present

## 2022-10-17 MED ORDER — LIDOCAINE HCL (PF) 2 % IJ SOLN
10.0000 mL | Freq: Once | INTRAMUSCULAR | Status: AC
  Administered 2022-10-17: 10 mL

## 2022-10-17 NOTE — Progress Notes (Signed)
PT tolerated 2 thyroid biopsies procedure well today. Labs and afirmas obtained and sent for pathology by Richard from Ultrasound. PT ambulatory at discharge with no acute distress noted and verbalized understanding of discharge instructions.

## 2022-10-21 DIAGNOSIS — R911 Solitary pulmonary nodule: Secondary | ICD-10-CM

## 2022-10-21 HISTORY — DX: Solitary pulmonary nodule: R91.1

## 2022-10-25 LAB — CYTOLOGY - NON PAP

## 2022-10-26 ENCOUNTER — Encounter: Payer: Self-pay | Admitting: Pulmonary Disease

## 2022-10-26 ENCOUNTER — Ambulatory Visit: Payer: Medicare HMO | Admitting: Pulmonary Disease

## 2022-10-26 VITALS — BP 140/90 | HR 51 | Ht 63.0 in | Wt 144.8 lb

## 2022-10-26 DIAGNOSIS — R911 Solitary pulmonary nodule: Secondary | ICD-10-CM | POA: Diagnosis not present

## 2022-10-26 DIAGNOSIS — I4891 Unspecified atrial fibrillation: Secondary | ICD-10-CM | POA: Diagnosis not present

## 2022-10-26 NOTE — H&P (View-Only) (Signed)
Synopsis: Referred in September 2024 for pulmonary nodule by Ignatius Specking, MD  Subjective:   PATIENT ID: Lori David GENDER: female DOB: [birth year], MRN: [id number]  Chief Complaint  Patient presents with   Consult    Consult on lung nodule.    This is a 88 year old female, past medical history of arthritis, atrial flutter, gastroesophageal reflux, hypercholesterolemia, hypertension.  Patient had a CT scan of the chest which found a left upper lobe pulmonary nodule.  Lori David was seen by Dr. [name] in pulmonary clinic and sent for nuclear medicine PET scan.  Nuclear medicine pet imaging did not have any significant uptake within the lesion and due to its size location and morphology it still concerning for an indolent carcinoma.  And so Lori David was referred here for consideration of bronchoscopy and biopsy.  Lori David has talked with her primary care provider as well as family and Dr. [name] and Lori David feels like Lori David would like to proceed with biopsy versus watchful waiting with repeat imaging.  We discussed this in detail today.    Past Medical History:  Diagnosis Date   Arthritis    "in my back/gastro dr" (03/22/2014)   Atrial flutter (HCC)    a. Pt reports episode 02/2013 noted pre-op cataracts but was in NSR at time of f/u. Saw cardiology and was told Lori David had a leaky heart valve.   Family history of adverse reaction to anesthesia    "some people in my family have; don't know what happened though" (03/22/2014)   GERD (gastroesophageal reflux disease)    GI problem    a. Pt reports workup for abdominal sensitivity with unremarkable endoscopy and CT.   Heart valve disease    leaking heart valve   Hypercholesterolemia    Hypertension    Osteopenia    Seasonal allergies    Valvular heart disease      Family History  Problem Relation Age of Onset   CAD Mother        MI age 40   CAD Father        MI age 73   Emphysema Father    Diabetes Other    Asthma Other    CAD Brother        MI age 30    CAD Brother        MI age 58   Heart disease Sister        One sister - unsure of what kind of heart issues   Atrial fibrillation Sister      Past Surgical History:  Procedure Laterality Date   BACTERIAL OVERGROWTH TEST N/A 08/13/2014   Procedure: BACTERIAL OVERGROWTH TEST;  Surgeon: Malissa Hippo, MD;  Location: [facility];  Service: Endoscopy;  Laterality: N/A;  730   CATARACT EXTRACTION W/ INTRAOCULAR LENS  IMPLANT, BILATERAL Bilateral 2015   CATARACT EXTRACTION W/PHACO Right 06/30/2013   Procedure: CATARACT EXTRACTION PHACO AND INTRAOCULAR LENS PLACEMENT (IOC);  Surgeon: Loraine Leriche T. Nile Riggs, MD;  Location: [facility];  Service: Ophthalmology;  Laterality: Right;  CDE:7.99   CATARACT EXTRACTION W/PHACO Left 07/14/2013   Procedure: CATARACT EXTRACTION PHACO AND INTRAOCULAR LENS PLACEMENT (IOC);  Surgeon: Loraine Leriche T. Nile Riggs, MD;  Location: [facility];  Service: Ophthalmology;  Laterality: Left;  CDE:  8.25   CHOLECYSTECTOMY  2000?   [facility]   ESOPHAGOGASTRODUODENOSCOPY N/A 01/20/2014   Procedure: ESOPHAGOGASTRODUODENOSCOPY (EGD);  Surgeon: Malissa Hippo, MD;  Location: [facility];  Service: Endoscopy;  Laterality: N/A;  120 - moved  to 12:25 - [name] to notify pt   LAPAROSCOPIC CHOLECYSTECTOMY  ~ 2000   TONSILLECTOMY  1943   [facility]-no longer present   TONSILLECTOMY  ~ 1943    Social History   Socioeconomic History   Marital status: Single    Spouse name: Not on file   Number of children: Not on file   Years of education: Not on file   Highest education level: Not on file  Occupational History   Not on file  Tobacco Use   Smoking status: Former    Current packs/day: 0.00    Average packs/day: 0.8 packs/day for 15.0 years (11.3 ttl pk-yrs)    Types: Cigarettes    Start date: 03/19/1959    Quit date: 03/18/1974    Years since quitting: 48.6   Smokeless tobacco: Never   Tobacco comments:    "quit smoking cigarettes in the 1970's"  Vaping Use   Vaping status: Never Used   Substance and Sexual Activity   Alcohol use: No    Alcohol/week: 0.0 standard drinks of alcohol   Drug use: No   Sexual activity: Never  Other Topics Concern   Not on file  Social History Narrative   ** Merged History Encounter **       Social Determinants of Health   Financial Resource Strain: Not on file  Food Insecurity: Low Risk  (09/13/2022)   Received from [facility]   Hunger Vital Sign    Worried About Running Out of Food in the Last Year: Never true    Ran Out of Food in the Last Year: Never true  Transportation Needs: Not on file  Physical Activity: Not on file  Stress: Not on file  Social Connections: Not on file  Intimate Partner Violence: Not on file     Allergies  Allergen Reactions   Chocolate     Numb feeling around mouth    Peanuts [Peanut Oil]     hives   Penicillins     Unknown      Outpatient Medications Prior to Visit  Medication Sig Dispense Refill   acetaminophen (TYLENOL) 650 MG CR tablet Take 650 mg by mouth every 8 (eight) hours as needed for pain.     calcium carbonate (TUMS - DOSED IN MG ELEMENTAL CALCIUM) 500 MG chewable tablet Chew 1 tablet by mouth daily as needed for indigestion or heartburn.     cyanocobalamin (VITAMIN B12) 1000 MCG/ML injection Inject 1,000 mcg into the muscle every 30 (thirty) days.     digoxin (LANOXIN) 0.125 MG tablet Take 1 tablet (0.125 mg total) by mouth daily.     diltiazem (CARDIZEM CD) 120 MG 24 hr capsule TAKE 1 CAPSULE IN THE EVENING. 30 capsule 2   diltiazem (CARDIZEM CD) 240 MG 24 hr capsule TAKE 1 CAPSULE BY MOUTH EVERY MORNING. 90 capsule 1   metFORMIN (GLUCOPHAGE) 500 MG tablet Take 500 mg by mouth daily with breakfast.     metoprolol tartrate (LOPRESSOR) 50 MG tablet take (1) tablet twice daily. 60 tablet 2   sennosides-docusate sodium (SENOKOT-S) 8.6-50 MG tablet Take 1 tablet by mouth 2 (two) times daily.     Vitamin D, Ergocalciferol, (DRISDOL) 50000 UNITS CAPS capsule Take 50,000 Units by mouth  every 7 (seven) days. Mondays     warfarin (COUMADIN) 5 MG tablet Take 1 tablet daily except 1/2 tablet on Sundays.  NEEDS INR APPT 30 tablet 0   No facility-administered medications prior to visit.    Review of  Systems  Constitutional:  Negative for chills, fever, malaise/fatigue and weight loss.  HENT:  Negative for hearing loss, sore throat and tinnitus.   Eyes:  Negative for blurred vision and double vision.  Respiratory:  Negative for cough, hemoptysis, sputum production, shortness of breath, wheezing and stridor.   Cardiovascular:  Negative for chest pain, palpitations, orthopnea, leg swelling and PND.  Gastrointestinal:  Negative for abdominal pain, constipation, diarrhea, heartburn, nausea and vomiting.  Genitourinary:  Negative for dysuria, hematuria and urgency.  Musculoskeletal:  Negative for joint pain and myalgias.  Skin:  Negative for itching and rash.  Neurological:  Negative for dizziness, tingling, weakness and headaches.  Endo/Heme/Allergies:  Negative for environmental allergies. Does not bruise/bleed easily.  Psychiatric/Behavioral:  Negative for depression. The patient is not nervous/anxious and does not have insomnia.   All other systems reviewed and are negative.    Objective:  Physical Exam Vitals reviewed.  Constitutional:      General: Lori David is not in acute distress.    Appearance: Lori David is well-developed.  HENT:     Head: Normocephalic and atraumatic.  Eyes:     General: No scleral icterus.    Conjunctiva/sclera: Conjunctivae normal.     Pupils: Pupils are equal, round, and reactive to light.  Neck:     Vascular: No JVD.     Trachea: No tracheal deviation.  Cardiovascular:     Rate and Rhythm: Normal rate and regular rhythm.     Heart sounds: Normal heart sounds. No murmur heard. Pulmonary:     Effort: Pulmonary effort is normal. No tachypnea, accessory muscle usage or respiratory distress.     Breath sounds: No stridor. No wheezing, rhonchi or rales.   Abdominal:     General: Bowel sounds are normal. There is no distension.     Palpations: Abdomen is soft.     Tenderness: There is no abdominal tenderness.  Musculoskeletal:        General: No tenderness.     Cervical back: Neck supple.  Lymphadenopathy:     Cervical: No cervical adenopathy.  Skin:    General: Skin is warm and dry.     Capillary Refill: Capillary refill takes less than 2 seconds.     Findings: No rash.  Neurological:     Mental Status: Lori David is alert and oriented to person, place, and time.  Psychiatric:        Behavior: Behavior normal.      Vitals:   10/26/22 1519  BP: (!) 140/90  Pulse: (!) 51  SpO2: 97%  Weight: 144 lb 12.8 oz (65.7 kg)  Height: 5\' 3"  (1.6 m)   97% on RA BMI Readings from Last 3 Encounters:  10/26/22 25.65 kg/m  09/04/22 24.62 kg/m  08/07/22 24.69 kg/m   Wt Readings from Last 3 Encounters:  10/26/22 144 lb 12.8 oz (65.7 kg)  09/04/22 139 lb (63 kg)  08/07/22 139 lb 6.4 oz (63.2 kg)     CBC    Component Value Date/Time   WBC 4.8 03/23/2014 0442   RBC 4.53 03/23/2014 0442   HGB 13.1 03/23/2014 0442   HCT 40.2 03/23/2014 0442   PLT 229 03/23/2014 0442   MCV 88.7 03/23/2014 0442   MCH 28.9 03/23/2014 0442   MCHC 32.6 03/23/2014 0442   RDW 13.9 03/23/2014 0442   LYMPHSABS 1.2 06/23/2013 1300   MONOABS 0.6 06/23/2013 1300   EOSABS 0.1 06/23/2013 1300   BASOSABS 0.1 06/23/2013 1300     Chest Imaging:  09/20/2022 nuclear medicine pet imaging: Reviewed images with patient today. There is no real significant uptake within the upper lobe lesion.  Its morphology however in shape is concerning for malignancy. We talked about various options  Pulmonary Functions Testing Results:     No data to display          FeNO: ***  Pathology: ***  Echocardiogram: ***  Heart Catheterization: ***    Assessment & Plan:   No diagnosis found.  Discussion: ***   Current Outpatient Medications:    acetaminophen  (TYLENOL) 650 MG CR tablet, Take 650 mg by mouth every 8 (eight) hours as needed for pain., Disp: , Rfl:    calcium carbonate (TUMS - DOSED IN MG ELEMENTAL CALCIUM) 500 MG chewable tablet, Chew 1 tablet by mouth daily as needed for indigestion or heartburn., Disp: , Rfl:    cyanocobalamin (VITAMIN B12) 1000 MCG/ML injection, Inject 1,000 mcg into the muscle every 30 (thirty) days., Disp: , Rfl:    digoxin (LANOXIN) 0.125 MG tablet, Take 1 tablet (0.125 mg total) by mouth daily., Disp: , Rfl:    diltiazem (CARDIZEM CD) 120 MG 24 hr capsule, TAKE 1 CAPSULE IN THE EVENING., Disp: 30 capsule, Rfl: 2   diltiazem (CARDIZEM CD) 240 MG 24 hr capsule, TAKE 1 CAPSULE BY MOUTH EVERY MORNING., Disp: 90 capsule, Rfl: 1   metFORMIN (GLUCOPHAGE) 500 MG tablet, Take 500 mg by mouth daily with breakfast., Disp: , Rfl:    metoprolol tartrate (LOPRESSOR) 50 MG tablet, take (1) tablet twice daily., Disp: 60 tablet, Rfl: 2   sennosides-docusate sodium (SENOKOT-S) 8.6-50 MG tablet, Take 1 tablet by mouth 2 (two) times daily., Disp: , Rfl:    Vitamin D, Ergocalciferol, (DRISDOL) 50000 UNITS CAPS capsule, Take 50,000 Units by mouth every 7 (seven) days. Mondays, Disp: , Rfl:    warfarin (COUMADIN) 5 MG tablet, Take 1 tablet daily except 1/2 tablet on Sundays.  NEEDS INR APPT, Disp: 30 tablet, Rfl: 0  I spent *** minutes dedicated to the care of this patient on the date of this encounter to include pre-visit review of records, face-to-face time with the patient discussing conditions above, post visit ordering of testing, clinical documentation with the electronic health record, making appropriate referrals as documented, and communicating necessary findings to members of the patients care team.   Josephine Igo, DO [facility] 10/26/2022 3:43 PM

## 2022-10-26 NOTE — Patient Instructions (Signed)
Thank you for visiting Dr. Tonia Brooms at [facility]. Today we recommend the following:  Orders Placed This Encounter  Procedures   Procedural/ Surgical Case Request: ROBOTIC ASSISTED NAVIGATIONAL BRONCHOSCOPY   Ambulatory referral to Pulmonology   Bronchoscopy 11/20/2022 Will need cardiac clearance and stop coumadin 5 days prior   Return in about 1 month (around 11/27/2022) for with Kandice Robinsons, NP, after Bronchoscopy.    Please do your part to reduce the spread of COVID-19.

## 2022-10-26 NOTE — Progress Notes (Unsigned)
Synopsis: Referred in September 2024 for pulmonary nodule by Ignatius Specking, MD  Subjective:   PATIENT ID: Lori David GENDER: female DOB: [birth year], MRN: [id number]  Chief Complaint  Patient presents with   Consult    Consult on lung nodule.    This is a 88 year old female, past medical history of arthritis, atrial flutter, gastroesophageal reflux, hypercholesterolemia, hypertension.  Patient had a CT scan of the chest which found a left upper lobe pulmonary nodule.  She was seen by Dr. [name] in pulmonary clinic and sent for nuclear medicine PET scan.  Nuclear medicine pet imaging did not have any significant uptake within the lesion and due to its size location and morphology it still concerning for an indolent carcinoma.  And so she was referred here for consideration of bronchoscopy and biopsy.  She has talked with her primary care provider as well as family and Dr. [name] and she feels like she would like to proceed with biopsy versus watchful waiting with repeat imaging.  We discussed this in detail today.    Past Medical History:  Diagnosis Date   Arthritis    "in my back/gastro dr" (03/22/2014)   Atrial flutter (HCC)    a. Pt reports episode 02/2013 noted pre-op cataracts but was in NSR at time of f/u. Saw cardiology and was told she had a leaky heart valve.   Family history of adverse reaction to anesthesia    "some people in my family have; don't know what happened though" (03/22/2014)   GERD (gastroesophageal reflux disease)    GI problem    a. Pt reports workup for abdominal sensitivity with unremarkable endoscopy and CT.   Heart valve disease    leaking heart valve   Hypercholesterolemia    Hypertension    Osteopenia    Seasonal allergies    Valvular heart disease      Family History  Problem Relation Age of Onset   CAD Mother        MI age 40   CAD Father        MI age 73   Emphysema Father    Diabetes Other    Asthma Other    CAD Brother        MI age 30    CAD Brother        MI age 58   Heart disease Sister        One sister - unsure of what kind of heart issues   Atrial fibrillation Sister      Past Surgical History:  Procedure Laterality Date   BACTERIAL OVERGROWTH TEST N/A 08/13/2014   Procedure: BACTERIAL OVERGROWTH TEST;  Surgeon: Malissa Hippo, MD;  Location: [facility];  Service: Endoscopy;  Laterality: N/A;  730   CATARACT EXTRACTION W/ INTRAOCULAR LENS  IMPLANT, BILATERAL Bilateral 2015   CATARACT EXTRACTION W/PHACO Right 06/30/2013   Procedure: CATARACT EXTRACTION PHACO AND INTRAOCULAR LENS PLACEMENT (IOC);  Surgeon: Loraine Leriche T. Nile Riggs, MD;  Location: [facility];  Service: Ophthalmology;  Laterality: Right;  CDE:7.99   CATARACT EXTRACTION W/PHACO Left 07/14/2013   Procedure: CATARACT EXTRACTION PHACO AND INTRAOCULAR LENS PLACEMENT (IOC);  Surgeon: Loraine Leriche T. Nile Riggs, MD;  Location: [facility];  Service: Ophthalmology;  Laterality: Left;  CDE:  8.25   CHOLECYSTECTOMY  2000?   [facility]   ESOPHAGOGASTRODUODENOSCOPY N/A 01/20/2014   Procedure: ESOPHAGOGASTRODUODENOSCOPY (EGD);  Surgeon: Malissa Hippo, MD;  Location: [facility];  Service: Endoscopy;  Laterality: N/A;  120 - moved  to 12:25 - [name] to notify pt   LAPAROSCOPIC CHOLECYSTECTOMY  ~ 2000   TONSILLECTOMY  1943   [facility]-no longer present   TONSILLECTOMY  ~ 1943    Social History   Socioeconomic History   Marital status: Single    Spouse name: Not on file   Number of children: Not on file   Years of education: Not on file   Highest education level: Not on file  Occupational History   Not on file  Tobacco Use   Smoking status: Former    Current packs/day: 0.00    Average packs/day: 0.8 packs/day for 15.0 years (11.3 ttl pk-yrs)    Types: Cigarettes    Start date: 03/19/1959    Quit date: 03/18/1974    Years since quitting: 48.6   Smokeless tobacco: Never   Tobacco comments:    "quit smoking cigarettes in the 1970's"  Vaping Use   Vaping status: Never Used   Substance and Sexual Activity   Alcohol use: No    Alcohol/week: 0.0 standard drinks of alcohol   Drug use: No   Sexual activity: Never  Other Topics Concern   Not on file  Social History Narrative   ** Merged History Encounter **       Social Determinants of Health   Financial Resource Strain: Not on file  Food Insecurity: Low Risk  (09/13/2022)   Received from [facility]   Hunger Vital Sign    Worried About Running Out of Food in the Last Year: Never true    Ran Out of Food in the Last Year: Never true  Transportation Needs: Not on file  Physical Activity: Not on file  Stress: Not on file  Social Connections: Not on file  Intimate Partner Violence: Not on file     Allergies  Allergen Reactions   Chocolate     Numb feeling around mouth    Peanuts [Peanut Oil]     hives   Penicillins     Unknown      Outpatient Medications Prior to Visit  Medication Sig Dispense Refill   acetaminophen (TYLENOL) 650 MG CR tablet Take 650 mg by mouth every 8 (eight) hours as needed for pain.     calcium carbonate (TUMS - DOSED IN MG ELEMENTAL CALCIUM) 500 MG chewable tablet Chew 1 tablet by mouth daily as needed for indigestion or heartburn.     cyanocobalamin (VITAMIN B12) 1000 MCG/ML injection Inject 1,000 mcg into the muscle every 30 (thirty) days.     digoxin (LANOXIN) 0.125 MG tablet Take 1 tablet (0.125 mg total) by mouth daily.     diltiazem (CARDIZEM CD) 120 MG 24 hr capsule TAKE 1 CAPSULE IN THE EVENING. 30 capsule 2   diltiazem (CARDIZEM CD) 240 MG 24 hr capsule TAKE 1 CAPSULE BY MOUTH EVERY MORNING. 90 capsule 1   metFORMIN (GLUCOPHAGE) 500 MG tablet Take 500 mg by mouth daily with breakfast.     metoprolol tartrate (LOPRESSOR) 50 MG tablet take (1) tablet twice daily. 60 tablet 2   sennosides-docusate sodium (SENOKOT-S) 8.6-50 MG tablet Take 1 tablet by mouth 2 (two) times daily.     Vitamin D, Ergocalciferol, (DRISDOL) 50000 UNITS CAPS capsule Take 50,000 Units by mouth  every 7 (seven) days. Mondays     warfarin (COUMADIN) 5 MG tablet Take 1 tablet daily except 1/2 tablet on Sundays.  NEEDS INR APPT 30 tablet 0   No facility-administered medications prior to visit.    Review of  Systems  Constitutional:  Negative for chills, fever, malaise/fatigue and weight loss.  HENT:  Negative for hearing loss, sore throat and tinnitus.   Eyes:  Negative for blurred vision and double vision.  Respiratory:  Negative for cough, hemoptysis, sputum production, shortness of breath, wheezing and stridor.   Cardiovascular:  Negative for chest pain, palpitations, orthopnea, leg swelling and PND.  Gastrointestinal:  Negative for abdominal pain, constipation, diarrhea, heartburn, nausea and vomiting.  Genitourinary:  Negative for dysuria, hematuria and urgency.  Musculoskeletal:  Negative for joint pain and myalgias.  Skin:  Negative for itching and rash.  Neurological:  Negative for dizziness, tingling, weakness and headaches.  Endo/Heme/Allergies:  Negative for environmental allergies. Does not bruise/bleed easily.  Psychiatric/Behavioral:  Negative for depression. The patient is not nervous/anxious and does not have insomnia.   All other systems reviewed and are negative.    Objective:  Physical Exam Vitals reviewed.  Constitutional:      General: She is not in acute distress.    Appearance: She is well-developed.  HENT:     Head: Normocephalic and atraumatic.  Eyes:     General: No scleral icterus.    Conjunctiva/sclera: Conjunctivae normal.     Pupils: Pupils are equal, round, and reactive to light.  Neck:     Vascular: No JVD.     Trachea: No tracheal deviation.  Cardiovascular:     Rate and Rhythm: Normal rate and regular rhythm.     Heart sounds: Normal heart sounds. No murmur heard. Pulmonary:     Effort: Pulmonary effort is normal. No tachypnea, accessory muscle usage or respiratory distress.     Breath sounds: No stridor. No wheezing, rhonchi or rales.   Abdominal:     General: Bowel sounds are normal. There is no distension.     Palpations: Abdomen is soft.     Tenderness: There is no abdominal tenderness.  Musculoskeletal:        General: No tenderness.     Cervical back: Neck supple.  Lymphadenopathy:     Cervical: No cervical adenopathy.  Skin:    General: Skin is warm and dry.     Capillary Refill: Capillary refill takes less than 2 seconds.     Findings: No rash.  Neurological:     Mental Status: She is alert and oriented to person, place, and time.  Psychiatric:        Behavior: Behavior normal.      Vitals:   10/26/22 1519  BP: (!) 140/90  Pulse: (!) 51  SpO2: 97%  Weight: 144 lb 12.8 oz (65.7 kg)  Height: 5\' 3"  (1.6 m)   97% on RA BMI Readings from Last 3 Encounters:  10/26/22 25.65 kg/m  09/04/22 24.62 kg/m  08/07/22 24.69 kg/m   Wt Readings from Last 3 Encounters:  10/26/22 144 lb 12.8 oz (65.7 kg)  09/04/22 139 lb (63 kg)  08/07/22 139 lb 6.4 oz (63.2 kg)     CBC    Component Value Date/Time   WBC 4.8 03/23/2014 0442   RBC 4.53 03/23/2014 0442   HGB 13.1 03/23/2014 0442   HCT 40.2 03/23/2014 0442   PLT 229 03/23/2014 0442   MCV 88.7 03/23/2014 0442   MCH 28.9 03/23/2014 0442   MCHC 32.6 03/23/2014 0442   RDW 13.9 03/23/2014 0442   LYMPHSABS 1.2 06/23/2013 1300   MONOABS 0.6 06/23/2013 1300   EOSABS 0.1 06/23/2013 1300   BASOSABS 0.1 06/23/2013 1300     Chest Imaging:  09/20/2022 nuclear medicine pet imaging: Reviewed images with patient today. There is no real significant uptake within the upper lobe lesion.  Its morphology however in shape is concerning for malignancy. We talked about various options  Pulmonary Functions Testing Results:     No data to display          FeNO: ***  Pathology: ***  Echocardiogram: ***  Heart Catheterization: ***    Assessment & Plan:   No diagnosis found.  Discussion: ***   Current Outpatient Medications:    acetaminophen  (TYLENOL) 650 MG CR tablet, Take 650 mg by mouth every 8 (eight) hours as needed for pain., Disp: , Rfl:    calcium carbonate (TUMS - DOSED IN MG ELEMENTAL CALCIUM) 500 MG chewable tablet, Chew 1 tablet by mouth daily as needed for indigestion or heartburn., Disp: , Rfl:    cyanocobalamin (VITAMIN B12) 1000 MCG/ML injection, Inject 1,000 mcg into the muscle every 30 (thirty) days., Disp: , Rfl:    digoxin (LANOXIN) 0.125 MG tablet, Take 1 tablet (0.125 mg total) by mouth daily., Disp: , Rfl:    diltiazem (CARDIZEM CD) 120 MG 24 hr capsule, TAKE 1 CAPSULE IN THE EVENING., Disp: 30 capsule, Rfl: 2   diltiazem (CARDIZEM CD) 240 MG 24 hr capsule, TAKE 1 CAPSULE BY MOUTH EVERY MORNING., Disp: 90 capsule, Rfl: 1   metFORMIN (GLUCOPHAGE) 500 MG tablet, Take 500 mg by mouth daily with breakfast., Disp: , Rfl:    metoprolol tartrate (LOPRESSOR) 50 MG tablet, take (1) tablet twice daily., Disp: 60 tablet, Rfl: 2   sennosides-docusate sodium (SENOKOT-S) 8.6-50 MG tablet, Take 1 tablet by mouth 2 (two) times daily., Disp: , Rfl:    Vitamin D, Ergocalciferol, (DRISDOL) 50000 UNITS CAPS capsule, Take 50,000 Units by mouth every 7 (seven) days. Mondays, Disp: , Rfl:    warfarin (COUMADIN) 5 MG tablet, Take 1 tablet daily except 1/2 tablet on Sundays.  NEEDS INR APPT, Disp: 30 tablet, Rfl: 0  I spent *** minutes dedicated to the care of this patient on the date of this encounter to include pre-visit review of records, face-to-face time with the patient discussing conditions above, post visit ordering of testing, clinical documentation with the electronic health record, making appropriate referrals as documented, and communicating necessary findings to members of the patients care team.   Josephine Igo, DO [facility] 10/26/2022 3:43 PM

## 2022-10-31 ENCOUNTER — Other Ambulatory Visit: Payer: Self-pay | Admitting: Otolaryngology

## 2022-10-31 DIAGNOSIS — E041 Nontoxic single thyroid nodule: Secondary | ICD-10-CM

## 2022-11-01 ENCOUNTER — Other Ambulatory Visit: Payer: Self-pay | Admitting: Internal Medicine

## 2022-11-01 ENCOUNTER — Encounter: Payer: Self-pay | Admitting: Nurse Practitioner

## 2022-11-01 ENCOUNTER — Ambulatory Visit: Payer: Medicare HMO | Attending: Nurse Practitioner | Admitting: Nurse Practitioner

## 2022-11-01 ENCOUNTER — Ambulatory Visit: Payer: Medicare HMO | Attending: Internal Medicine | Admitting: *Deleted

## 2022-11-01 VITALS — BP 110/80 | HR 82 | Ht 63.0 in | Wt 143.8 lb

## 2022-11-01 DIAGNOSIS — Z79899 Other long term (current) drug therapy: Secondary | ICD-10-CM | POA: Diagnosis not present

## 2022-11-01 DIAGNOSIS — I10 Essential (primary) hypertension: Secondary | ICD-10-CM | POA: Diagnosis not present

## 2022-11-01 DIAGNOSIS — I4891 Unspecified atrial fibrillation: Secondary | ICD-10-CM

## 2022-11-01 DIAGNOSIS — I428 Other cardiomyopathies: Secondary | ICD-10-CM | POA: Diagnosis not present

## 2022-11-01 DIAGNOSIS — I071 Rheumatic tricuspid insufficiency: Secondary | ICD-10-CM

## 2022-11-01 DIAGNOSIS — Z01810 Encounter for preprocedural cardiovascular examination: Secondary | ICD-10-CM | POA: Diagnosis not present

## 2022-11-01 DIAGNOSIS — I4821 Permanent atrial fibrillation: Secondary | ICD-10-CM

## 2022-11-01 DIAGNOSIS — Z5181 Encounter for therapeutic drug level monitoring: Secondary | ICD-10-CM

## 2022-11-01 DIAGNOSIS — I77819 Aortic ectasia, unspecified site: Secondary | ICD-10-CM | POA: Diagnosis not present

## 2022-11-01 DIAGNOSIS — I5032 Chronic diastolic (congestive) heart failure: Secondary | ICD-10-CM

## 2022-11-01 DIAGNOSIS — I340 Nonrheumatic mitral (valve) insufficiency: Secondary | ICD-10-CM | POA: Diagnosis not present

## 2022-11-01 LAB — POCT INR: INR: 3.6 — AB (ref 2.0–3.0)

## 2022-11-01 NOTE — Patient Instructions (Addendum)
Medication Instructions:  Your physician recommends that you continue on your current medications as directed. Please refer to the Current Medication list given to you today.  Labwork: Digoxin Level this week at [facility]   Testing/Procedures: None  Follow-Up: Your physician recommends that you schedule a follow-up appointment in: 6 Months   Any Other Special Instructions Will Be Listed Below (If Applicable).  If you need a refill on your cardiac medications before your next appointment, please call your pharmacy.

## 2022-11-01 NOTE — Patient Instructions (Signed)
Hold warfarin tonight then resume 1 tablet daily except 1/2 tablet only on Sundays Pending bronchoscopy on 10/1.  Was told to hold warfarin 5 days before procedure and restart night of procedure if OK with MD Recheck 1 week after procedure.

## 2022-11-01 NOTE — Progress Notes (Addendum)
Cardiology Office Note:    Date: 11/01/2022 ID:  Lori David, DOB [birth year], MRN [id number] PCP:  Ignatius Specking, MD  HeartCare Providers Cardiologist:  Marjo Bicker, MD    Referring MD: Ignatius Specking, MD   CC: Here for follow-up  History of Present Illness:    Lori David is a 88 y.o. female with a PMH of permanent A-fib, hypertension, nonischemic cardiomyopathy, valvular insufficiency, type 2 diabetes, pulmonary nodule, and GERD, who presents today for scheduled office visit.  NST in 2016 was negative for any ischemia, EF 51%.  TTE in 2016 revealed EF 40 to 45%, global hypokinesis and a corner septal wall motion of left ventricle, moderate MR, there was increased thickness of atrial septum that was consistent with lipomatous hypertrophy, no other significant valvular abnormalities.  Repeat TTE in 2017 revealed EF improved to 50 to 55%, mildly calcified aortic valve without stenosis, moderate MR with mild TR, severely dilated left atrium, no other significant valvular abnormalities.  Last seen by Dr. Jenene Slicker on August 07, 2022. Was overall doing well at the time. Recent digoxin level checked prior to office visit was sub-therapeutic.   Our office was contacted on September 17, 2022 by patient noting shortness of breath - see telephone note. Patient reported restarting Digoxin on her own. Vital signs provided by patient were WNL. Denied SOB in office. TTE was arranged and revealed normal EF, BAE, mild MR, mild TR, mildly dilated ascending aorta at 36 mm. Digoxin level order was arranged.   Today she presents for office evaluation. Says she is feeling better and breathing better since she has returned to taking Digoxin on her own again. Says she restarted it as her shortness of breath concerned her greatly, says her shortness of breath almost prompted her to go to the ED. She is being followed by Pulmonology and scheduled for a robotic assisted bronchoscopy for biopsy.  Previous  CT scan of chest found a left upper lobe pulmonary nodule. Denies any chest pain, shortness of breath, palpitations, syncope, presyncope, dizziness, orthopnea, PND, swelling or significant weight changes, acute bleeding, or claudication.  SH: Lives independently, does all her own housework.   Please see the history of present illness.    All other systems reviewed and are negative.  EKGs/Labs/Other Studies Reviewed:    The following studies were reviewed today:   EKG:  EKG is not ordered today.   Echo 09/2022:  1. Left ventricular ejection fraction, by estimation, is 50 to 55%. The  left ventricle has low normal function. The left ventricle has no regional  wall motion abnormalities. Left ventricular diastolic parameters are  indeterminate.   2. Right ventricular systolic function is normal. The right ventricular  size is normal.   3. Left atrial size was moderately dilated.   4. Right atrial size was moderately dilated.   5. The mitral valve is abnormal. Mild mitral valve regurgitation. No  evidence of mitral stenosis.   6. The tricuspid valve is abnormal.   7. The aortic valve is tricuspid. Aortic valve regurgitation is not  visualized. No aortic stenosis is present.   8. Aortic dilatation noted. There is mild dilatation of the ascending  aorta, measuring 36 mm.   Comparison(s): EF 50-55%. Moderate mitral regurgitation. Left atrium was  severely dilated. Mild tricuspid regurgitiation.   2D Echocardiogram on 02/16/2016: - Left ventricle: The cavity size was normal. Wall thickness was    normal. Systolic function was normal. The estimated ejection    fraction  was in the range of 50% to 55%. Wall motion was normal;    there were no regional wall motion abnormalities. The study is    not technically sufficient to allow evaluation of LV diastolic    function.  - Aortic valve: Trileaflet; mildly calcified leaflets. There was no    stenosis.  - Mitral valve: There was moderate  regurgitation.  - Left atrium: The atrium was severely dilated.  - Tricuspid valve: There was mild regurgitation.   Lexiscan on 03/23/2014: Normal stress nuclear study with no chest pain, no ST changes and no evidence of ischemia or infarction in any vascular territory. The gated ejection fraction was 51% and the wall motion was normal.  Recent Labs: No results found for requested labs within last 365 days.  Recent Lipid Panel    Component Value Date/Time   CHOL 138 03/23/2014 0442   TRIG 99 03/23/2014 0442   HDL 40 03/23/2014 0442   CHOLHDL 3.5 03/23/2014 0442   VLDL 20 03/23/2014 0442   LDLCALC 78 03/23/2014 0442     Risk Assessment/Calculations:    CHA2DS2-VASc Score = 5  This indicates a 7.2% annual risk of stroke. The patient's score is based upon: CHF History: 1 HTN History: 1 Diabetes History: 0 Stroke History: 0 Vascular Disease History: 0 Age Score: 2 Gender Score: 1    Physical Exam:    VS:  BP 110/80   Pulse 82   Ht 5\' 3"  (1.6 m)   Wt 143 lb 12.8 oz (65.2 kg)   SpO2 97%   BMI 25.47 kg/m     Wt Readings from Last 3 Encounters:  11/01/22 143 lb 12.8 oz (65.2 kg)  10/26/22 144 lb 12.8 oz (65.7 kg)  09/04/22 139 lb (63 kg)     GEN: Thin 88 y.o. female in no acute distress HEENT: Normal NECK: No JVD; No carotid bruits CARDIAC: S1/S2, irregularly irregular, no murmurs, rubs, gallops; 2+ peripheral pulses throughout, strong and equal bilaterally RESPIRATORY:  Clear and diminished to auscultation without rales, wheezing or rhonchi  MUSCULOSKELETAL:  No edema; No deformity  SKIN: Warm and dry NEUROLOGIC:  Alert and oriented x 3 PSYCHIATRIC:  Normal affect   ASSESSMENT:    1. Permanent atrial fibrillation (HCC)   2. Encounter for monitoring digoxin therapy   3. Medication management   4. Nonischemic cardiomyopathy (HCC)   5. Heart failure with improved ejection fraction (HFimpEF) (HCC)   6. Mitral valve insufficiency, unspecified etiology   7.  Tricuspid valve insufficiency, unspecified etiology   8. Essential hypertension, benign   9. Aortic dilatation (HCC)   10. Pre-operative cardiovascular examination     PLAN:    In order of problems listed above:  Permanent A-fib, long term anticoagulation, digoxin therapy monitoring, medication management Denies any palpitations or tachycardia. Heart rate well controlled. Patient restarted Digoxin on her own, will repeat Digoxin level at this time. Currently tolerating medications well.  Continue digoxin, Cardizem, Lopressor, and Coumadin.  CHA2DS2-VASc score 5.  Continue to follow-up with Coumadin clinic.  2. History of nonischemic cardiomyopathy, HFimpEF EF mildly reduced in 2016 at 40-45%, recent EF 50-55%. No RWMA's.  Euvolemic and well compensated on exam. Continue current medication regimen. Heart healthy diet, low salt diet and regular cardiovascular exercise encouraged.   3. Mitral valve regurgitation, mild TR Echocardiogram in 2017 revealed moderate MR with mild TR, recent Echo revealed mild MR with mild TR. Heart healthy diet and regular cardiovascular exercise encouraged. Recommend updating Echo in 5 years  or sooner if clinically indicated.   4. HTN BP today stable. BP well controlled at home. Continue current medication regimen. Heart healthy diet and regular cardiovascular exercise encouraged.   5. Aortic dilatation Mild dilatation of ascending aorta noted on recent echocardiogram, measuring 36 mm.  Will continue to monitor over time.   6. Pre-op cardiovascular risk assessment [id number]} Lori David's perioperative risk of a major cardiac event is 6.6% according to the Revised Cardiac Risk Index (RCRI).  Therefore, she is at high risk for perioperative complications.   Her functional capacity is excellent at 6.05 METs according to the Duke Activity Status Index (DASI). Recommendations: According to ACC/AHA guidelines, no further cardiovascular testing needed.  The patient may  proceed to surgery at acceptable risk.   Antiplatelet and/or Anticoagulation Recommendations: Per office protocol, patient can hold warfarin for 5 days prior to procedure.  Patient will not need bridging with Lovenox (enoxaparin) around procedure. Will route note to requesting party.  7. Disposition: Follow-up with Dr. Jenene Slicker or APP  in 6 months or sooner if anything changes.     Medication Adjustments/Labs and Tests Ordered: Current medicines are reviewed at length with the patient today.  Concerns regarding medicines are outlined above.  Orders Placed This Encounter  Procedures   Digoxin level   No orders of the defined types were placed in this encounter.   Patient Instructions  Medication Instructions:  Your physician recommends that you continue on your current medications as directed. Please refer to the Current Medication list given to you today.  Labwork: Digoxin Level this week! At [facility]   Testing/Procedures: None  Follow-Up: Your physician recommends that you schedule a follow-up appointment in: 6 Months   Any Other Special Instructions Will Be Listed Below (If Applicable).  If you need a refill on your cardiac medications before your next appointment, please call your pharmacy.   Signed, Sharlene Dory, NP  11/01/2022 1:10 PM    [facility]

## 2022-11-02 NOTE — Telephone Encounter (Signed)
Warfarin 5mg  refill Afib Last INR 11/01/22 Last OV 11/01/22

## 2022-11-08 NOTE — Progress Notes (Signed)
Patient with diagnosis of atrial fibrillation on warfarin for anticoagulation.    Procedure: bronchoscopy Date of procedure: 11/20/22   CHA2DS2-VASc Score = 5   This indicates a 7.2% annual risk of stroke. The patient's score is based upon: CHF History: 1 HTN History: 1 Diabetes History: 0 Stroke History: 0 Vascular Disease History: 0 Age Score: 2 Gender Score: 1   CrCl 54 Platelet count 247  Per office protocol, patient can hold warfarin for 5 days prior to procedure.   Patient will not need bridging with Lovenox (enoxaparin) around procedure.  **This guidance is not considered finalized until pre-operative APP has relayed final recommendations.**  Patient followed at [place] office.  Is already aware to hold warfarin per most recent Anti-coag note

## 2022-11-09 NOTE — Telephone Encounter (Signed)
c 

## 2022-11-14 ENCOUNTER — Ambulatory Visit (HOSPITAL_COMMUNITY)
Admission: RE | Admit: 2022-11-14 | Discharge: 2022-11-14 | Disposition: A | Discharge status: Home / Self Care | Payer: Medicare HMO | Source: Ambulatory Visit | Attending: Internal Medicine | Admitting: Internal Medicine

## 2022-11-14 DIAGNOSIS — R911 Solitary pulmonary nodule: Secondary | ICD-10-CM

## 2022-11-14 DIAGNOSIS — R918 Other nonspecific abnormal finding of lung field: Secondary | ICD-10-CM | POA: Diagnosis not present

## 2022-11-16 ENCOUNTER — Other Ambulatory Visit: Payer: Self-pay

## 2022-11-16 ENCOUNTER — Encounter (HOSPITAL_COMMUNITY): Payer: Self-pay | Admitting: Pulmonary Disease

## 2022-11-16 NOTE — Progress Notes (Signed)
PCP - Dr Doreen Beam Cardiologist - Dr Mikal Plane Vishnu (Clearance in EPIC - See Progress Note on 11/01/22)  CT Chest x-ray - 11/14/22 EKG - 08/07/22 Stress Test - 03/23/14 ECHO - 09/25/22 Cardiac Cath - n/a  ICD Pacemaker/Loop - n/a  Sleep Study -  n/a CPAP - none  Diabetes Type 2 - Do not take Metformin on DOS.   Blood Thinner Instructions:  Hold Coumadin 5 days prior to surgery.  Last dose was on 11/15/22.  NPO  Anesthesia review: Yes  STOP now taking any Aspirin (unless otherwise instructed by your surgeon), Aleve, Naproxen, Ibuprofen, Motrin, Advil, Goody's, BC's, all herbal medications, fish oil, and all vitamins.   Coronavirus Screening Do you have any of the following symptoms:  Cough yes/no: No Fever (>100.73F)  yes/no: No Runny nose occasional - r/t allergies Sore throat yes/no: No Difficulty breathing/shortness of breath  Yes  Have you traveled in the last 14 days and where? yes/no: No  Patient verbalized understanding of instructions that were given via phone.

## 2022-11-20 ENCOUNTER — Ambulatory Visit (HOSPITAL_COMMUNITY): Payer: Medicare HMO | Admitting: Anesthesiology

## 2022-11-20 ENCOUNTER — Encounter (HOSPITAL_COMMUNITY): Payer: Self-pay | Admitting: Pulmonary Disease

## 2022-11-20 ENCOUNTER — Ambulatory Visit (HOSPITAL_BASED_OUTPATIENT_CLINIC_OR_DEPARTMENT_OTHER): Payer: Medicare HMO | Admitting: Anesthesiology

## 2022-11-20 ENCOUNTER — Encounter (HOSPITAL_COMMUNITY)
Admission: RE | Disposition: A | Discharge status: Home / Self Care | Payer: Self-pay | Source: Home / Self Care | Attending: Pulmonary Disease

## 2022-11-20 ENCOUNTER — Ambulatory Visit (HOSPITAL_COMMUNITY): Payer: Medicare HMO

## 2022-11-20 ENCOUNTER — Ambulatory Visit (HOSPITAL_COMMUNITY)
Admission: RE | Admit: 2022-11-20 | Discharge: 2022-11-20 | Disposition: A | Discharge status: Home / Self Care | Payer: Medicare HMO | Attending: Pulmonary Disease | Admitting: Pulmonary Disease

## 2022-11-20 DIAGNOSIS — Z7901 Long term (current) use of anticoagulants: Secondary | ICD-10-CM | POA: Insufficient documentation

## 2022-11-20 DIAGNOSIS — I5022 Chronic systolic (congestive) heart failure: Secondary | ICD-10-CM | POA: Diagnosis not present

## 2022-11-20 DIAGNOSIS — Z7984 Long term (current) use of oral hypoglycemic drugs: Secondary | ICD-10-CM | POA: Insufficient documentation

## 2022-11-20 DIAGNOSIS — I509 Heart failure, unspecified: Secondary | ICD-10-CM | POA: Diagnosis not present

## 2022-11-20 DIAGNOSIS — E7800 Pure hypercholesterolemia, unspecified: Secondary | ICD-10-CM | POA: Diagnosis not present

## 2022-11-20 DIAGNOSIS — R911 Solitary pulmonary nodule: Secondary | ICD-10-CM

## 2022-11-20 DIAGNOSIS — I10 Essential (primary) hypertension: Secondary | ICD-10-CM | POA: Diagnosis not present

## 2022-11-20 DIAGNOSIS — Z833 Family history of diabetes mellitus: Secondary | ICD-10-CM | POA: Insufficient documentation

## 2022-11-20 DIAGNOSIS — I517 Cardiomegaly: Secondary | ICD-10-CM | POA: Diagnosis not present

## 2022-11-20 DIAGNOSIS — E119 Type 2 diabetes mellitus without complications: Secondary | ICD-10-CM | POA: Diagnosis not present

## 2022-11-20 DIAGNOSIS — K219 Gastro-esophageal reflux disease without esophagitis: Secondary | ICD-10-CM | POA: Insufficient documentation

## 2022-11-20 DIAGNOSIS — Z48813 Encounter for surgical aftercare following surgery on the respiratory system: Secondary | ICD-10-CM | POA: Diagnosis not present

## 2022-11-20 DIAGNOSIS — Z87891 Personal history of nicotine dependence: Secondary | ICD-10-CM | POA: Diagnosis not present

## 2022-11-20 DIAGNOSIS — C3411 Malignant neoplasm of upper lobe, right bronchus or lung: Secondary | ICD-10-CM | POA: Insufficient documentation

## 2022-11-20 DIAGNOSIS — I4891 Unspecified atrial fibrillation: Secondary | ICD-10-CM | POA: Diagnosis not present

## 2022-11-20 DIAGNOSIS — Z8249 Family history of ischemic heart disease and other diseases of the circulatory system: Secondary | ICD-10-CM | POA: Insufficient documentation

## 2022-11-20 HISTORY — DX: Dyspnea, unspecified: R06.00

## 2022-11-20 HISTORY — DX: Type 2 diabetes mellitus without complications: E11.9

## 2022-11-20 HISTORY — PX: BRONCHIAL BIOPSY: SHX5109

## 2022-11-20 HISTORY — PX: FIDUCIAL MARKER PLACEMENT: SHX6858

## 2022-11-20 LAB — BASIC METABOLIC PANEL
Anion gap: 11 (ref 5–15)
BUN: 13 mg/dL (ref 8–23)
CO2: 20 mmol/L — ABNORMAL LOW (ref 22–32)
Calcium: 9 mg/dL (ref 8.9–10.3)
Chloride: 104 mmol/L (ref 98–111)
Creatinine, Ser: 0.85 mg/dL (ref 0.44–1.00)
GFR, Estimated: >60 mL/min (ref >60)
Glucose, Bld: 112 mg/dL — ABNORMAL HIGH (ref 70–99)
Potassium: 3.9 mmol/L (ref 3.5–5.1)
Sodium: 135 mmol/L (ref 135–145)

## 2022-11-20 LAB — GLUCOSE, CAPILLARY
Glucose-Capillary: 109 mg/dL — ABNORMAL HIGH (ref 70–99)
Glucose-Capillary: 103 mg/dL — ABNORMAL HIGH (ref 70–99)
Glucose-Capillary: 90 mg/dL (ref 70–99)

## 2022-11-20 LAB — CBC
HCT: 44.1 % (ref 36.0–46.0)
Hemoglobin: 13.9 g/dL (ref 12.0–15.0)
MCH: 26.7 pg (ref 26.0–34.0)
MCHC: 31.5 g/dL (ref 30.0–36.0)
MCV: 84.8 fL (ref 80.0–100.0)
Platelets: 230 10*3/uL (ref 150–400)
RBC: 5.2 MIL/uL — ABNORMAL HIGH (ref 3.87–5.11)
RDW: 15 % (ref 11.5–15.5)
WBC: 5.8 10*3/uL (ref 4.0–10.5)
nRBC: 0 % (ref 0.0–0.2)

## 2022-11-20 LAB — PROTIME-INR
INR: 1.2 (ref 0.8–1.2)
Prothrombin Time: 15.6 s — ABNORMAL HIGH (ref 11.4–15.2)

## 2022-11-20 SURGERY — BRONCHOSCOPY, WITH BIOPSY USING ELECTROMAGNETIC NAVIGATION
Anesthesia: General | Laterality: Left

## 2022-11-20 MED ORDER — LIDOCAINE 2% (20 MG/ML) 5 ML SYRINGE
INTRAMUSCULAR | Status: DC | PRN
  Administered 2022-11-20: 60 mg via INTRAVENOUS

## 2022-11-20 MED ORDER — AMISULPRIDE (ANTIEMETIC) 5 MG/2ML IV SOLN: 10.0000 mg | Freq: Once | INTRAVENOUS | Status: DC | PRN

## 2022-11-20 MED ORDER — CHLORHEXIDINE GLUCONATE 0.12 % MT SOLN: 15.0000 mL | Freq: Once | OROMUCOSAL | Status: AC

## 2022-11-20 MED ORDER — FENTANYL CITRATE (PF) 250 MCG/5ML IJ SOLN
INTRAMUSCULAR | Status: AC
  Filled 2022-11-20: qty 5

## 2022-11-20 MED ORDER — FENTANYL CITRATE (PF) 100 MCG/2ML IJ SOLN: 25.0000 ug | INTRAMUSCULAR | Status: DC | PRN

## 2022-11-20 MED ORDER — PHENYLEPHRINE 80 MCG/ML (10ML) SYRINGE FOR IV PUSH (FOR BLOOD PRESSURE SUPPORT)
PREFILLED_SYRINGE | INTRAVENOUS | Status: DC | PRN
  Administered 2022-11-20: 240 ug via INTRAVENOUS

## 2022-11-20 MED ORDER — ONDANSETRON HCL 4 MG/2ML IJ SOLN
INTRAMUSCULAR | Status: DC | PRN
  Administered 2022-11-20: 4 mg via INTRAVENOUS

## 2022-11-20 MED ORDER — FENTANYL CITRATE (PF) 250 MCG/5ML IJ SOLN
INTRAMUSCULAR | Status: DC | PRN
  Administered 2022-11-20: 100 ug via INTRAVENOUS

## 2022-11-20 MED ORDER — LACTATED RINGERS IV SOLN: INTRAVENOUS | Status: DC

## 2022-11-20 MED ORDER — ACETAMINOPHEN 10 MG/ML IV SOLN
1000.0000 mg | Freq: Once | INTRAVENOUS | Status: DC | PRN
  Filled 2022-11-20: qty 100

## 2022-11-20 MED ORDER — PROPOFOL 10 MG/ML IV BOLUS
INTRAVENOUS | Status: DC | PRN
  Administered 2022-11-20: 150 mg via INTRAVENOUS

## 2022-11-20 MED ORDER — INSULIN ASPART 100 UNIT/ML IJ SOLN: 0.0000 [IU] | INTRAMUSCULAR | Status: DC | PRN

## 2022-11-20 MED ORDER — DEXAMETHASONE SODIUM PHOSPHATE 10 MG/ML IJ SOLN
INTRAMUSCULAR | Status: DC | PRN
  Administered 2022-11-20: 10 mg via INTRAVENOUS

## 2022-11-20 MED ORDER — CHLORHEXIDINE GLUCONATE 0.12 % MT SOLN
OROMUCOSAL | Status: AC
  Administered 2022-11-20: 15 mL via OROMUCOSAL
  Filled 2022-11-20: qty 15

## 2022-11-20 MED ORDER — ROCURONIUM BROMIDE 10 MG/ML (PF) SYRINGE
PREFILLED_SYRINGE | INTRAVENOUS | Status: DC | PRN
  Administered 2022-11-20: 30 mg via INTRAVENOUS

## 2022-11-20 MED ORDER — SUGAMMADEX SODIUM 200 MG/2ML IV SOLN
INTRAVENOUS | Status: DC | PRN
  Administered 2022-11-20: 200 mg via INTRAVENOUS

## 2022-11-20 MED ORDER — ONDANSETRON HCL 4 MG/2ML IJ SOLN: 4.0000 mg | Freq: Once | INTRAMUSCULAR | Status: DC | PRN

## 2022-11-20 SURGICAL SUPPLY — 1 items: superlock fiducial marker IMPLANT

## 2022-11-20 NOTE — Discharge Instructions (Signed)

## 2022-11-20 NOTE — Anesthesia Procedure Notes (Signed)
Procedure Name: Intubation Date/Time: 11/20/2022 3:54 PM  Performed by: April Holding, CRNAPre-anesthesia Checklist: Patient identified, Emergency Drugs available, Suction available and Patient being monitored Patient Re-evaluated:Patient Re-evaluated prior to induction Oxygen Delivery Method: Circle System Utilized Preoxygenation: Pre-oxygenation with 100% oxygen Induction Type: IV induction Ventilation: Mask ventilation without difficulty Laryngoscope Size: 2 and Miller Grade View: Grade I Tube type: Oral Tube size: 8.5 mm Number of attempts: 1 Airway Equipment and Method: Stylet and Oral airway Placement Confirmation: ETT inserted through vocal cords under direct vision, positive ETCO2 and breath sounds checked- equal and bilateral Secured at: 21 cm Tube secured with: Tape Dental Injury: Teeth and Oropharynx as per pre-operative assessment

## 2022-11-20 NOTE — Op Note (Signed)
Video Bronchoscopy with Robotic Assisted Bronchoscopic Navigation   Date of Operation: 11/20/2022   Pre-op Diagnosis: RUL lung nodule   Post-op Diagnosis: RUL lung nodule   Surgeon: Josephine Igo, DO   Assistants: None   Anesthesia: General endotracheal anesthesia  Operation: Flexible video fiberoptic bronchoscopy with robotic assistance and biopsies.  Estimated Blood Loss: Minimal  Complications: None  Indications and History: Lori David is a 88 y.o. female with history of RUL lung nodule. The risks, benefits, complications, treatment options and expected outcomes were discussed with the patient.  The possibilities of pneumothorax, pneumonia, reaction to medication, pulmonary aspiration, perforation of a viscus, bleeding, failure to diagnose a condition and creating a complication requiring transfusion or operation were discussed with the patient who freely signed the consent.    Description of Procedure: The patient was seen in the Preoperative Area, was examined and was deemed appropriate to proceed.  The patient was taken to [facility] endoscopy room 3, identified as [name] and the procedure verified as Flexible Video Fiberoptic Bronchoscopy.  A Time Out was held and the above information confirmed.   Prior to the date of the procedure a high-resolution CT scan of the chest was performed. Utilizing ION software program a virtual tracheobronchial tree was generated to allow the creation of distinct navigation pathways to the patient's parenchymal abnormalities. After being taken to the operating room general anesthesia was initiated and the patient  was orally intubated. The video fiberoptic bronchoscope was introduced via the endotracheal tube and a general inspection was performed which showed normal right and left lung anatomy, aspiration of the bilateral mainstems was completed to remove any remaining secretions. Robotic catheter inserted into patient's endotracheal tube.    Target #1 RUL lung nodule: The distinct navigation pathways prepared prior to this procedure were then utilized to navigate to patient's lesion identified on CT scan. The robotic catheter was secured into place and the vision probe was withdrawn.  Lesion location was approximated using fluoroscopy and 3D CBCT for ct guided needle bx for peripheral targeting. Under fluoroscopic guidance transbronchial forceps biopsies were performed to be sent for cytology and pathology. Following biopsies a single fiducial was placed within proximity of the lesion using the fiducial catheter wire and delivery kit.   At the end of the procedure a general airway inspection was performed and there was no evidence of active bleeding. The bronchoscope was removed.  The patient tolerated the procedure well. There was no significant blood loss and there were no obvious complications. A post-procedural chest x-ray is pending.  Samples Target #1: 1. Transbronchial forceps biopsies from RUL  Plans:  The patient will be discharged from the PACU to home when recovered from anesthesia and after chest x-ray is reviewed. We will review the cytology, pathology results with the patient when they become available. Outpatient followup will be with Josephine Igo, DO.  Josephine Igo, DO [facility] 11/20/2022 4:28 PM

## 2022-11-20 NOTE — Anesthesia Preprocedure Evaluation (Addendum)
Anesthesia Evaluation  Patient identified by MRN, date of birth, ID band Patient awake    Reviewed: Allergy & Precautions, NPO status , Patient's Chart, lab work & pertinent test results  Airway Mallampati: III  TM Distance: >3 FB Neck ROM: Full    Dental  (+) Missing, Partial Upper   Pulmonary former smoker   Pulmonary exam normal        Cardiovascular hypertension, Pt. on home beta blockers Normal cardiovascular exam+ dysrhythmias Atrial Fibrillation + Valvular Problems/Murmurs MR   ECHO: 1. Left ventricular ejection fraction, by estimation, is 50 to 55%. The  left ventricle has low normal function. The left ventricle has no regional  wall motion abnormalities. Left ventricular diastolic parameters are  indeterminate.   2. Right ventricular systolic function is normal. The right ventricular  size is normal.   3. Left atrial size was moderately dilated.   4. Right atrial size was moderately dilated.   5. The mitral valve is abnormal. Mild mitral valve regurgitation. No  evidence of mitral stenosis.   6. The tricuspid valve is abnormal.   7. The aortic valve is tricuspid. Aortic valve regurgitation is not  visualized. No aortic stenosis is present.   8. Aortic dilatation noted. There is mild dilatation of the ascending  aorta, measuring 36 mm     Neuro/Psych negative neurological ROS  negative psych ROS   GI/Hepatic negative GI ROS, Neg liver ROS,,,  Endo/Other  diabetes, Oral Hypoglycemic Agents    Renal/GU negative Renal ROS     Musculoskeletal  (+) Arthritis ,    Abdominal   Peds  Hematology  (+) Blood dyscrasia (Coumadin)   Anesthesia Other Findings lung nodule  Reproductive/Obstetrics                             Anesthesia Physical Anesthesia Plan  ASA: 3  Anesthesia Plan: General   Post-op Pain Management:    Induction: Intravenous  PONV Risk Score and Plan: 3 and  Ondansetron, Dexamethasone, Propofol infusion and Treatment may vary due to age or medical condition  Airway Management Planned: Oral ETT  Additional Equipment:   Intra-op Plan:   Post-operative Plan: Extubation in OR  Informed Consent: I have reviewed the patients History and Physical, chart, labs and discussed the procedure including the risks, benefits and alternatives for the proposed anesthesia with the patient or authorized representative who has indicated his/her understanding and acceptance.     Dental advisory given  Plan Discussed with: CRNA  Anesthesia Plan Comments:        Anesthesia Quick Evaluation

## 2022-11-20 NOTE — Interval H&P Note (Signed)
History and Physical Interval Note:  11/20/2022 1:14 PM  Lori David  has presented today for surgery, with the diagnosis of lung nodule.  The various methods of treatment have been discussed with the patient and family. After consideration of risks, benefits and other options for treatment, the patient has consented to  Procedure(s): ROBOTIC ASSISTED NAVIGATIONAL BRONCHOSCOPY (Left) as a surgical intervention.  The patient's history has been reviewed, patient examined, no change in status, stable for surgery.  I have reviewed the patient's chart and labs.  Questions were answered to the patient's satisfaction.     [name]

## 2022-11-20 NOTE — Transfer of Care (Signed)
Immediate Anesthesia Transfer of Care Note  Patient: Lori David  Procedure(s) Performed: ROBOTIC ASSISTED NAVIGATIONAL BRONCHOSCOPY (Left) BRONCHIAL BIOPSIES FIDUCIAL MARKER PLACEMENT  Patient Location: PACU  Anesthesia Type:General  Level of Consciousness: awake and alert   Airway & Oxygen Therapy: Patient Spontanous Breathing and Patient connected to face mask oxygen  Post-op Assessment: Report given to RN and Post -op Vital signs reviewed and stable  Post vital signs: Reviewed and stable  Last Vitals:  Vitals Value Taken Time  BP 127/83 11/20/22 1630  Temp    Pulse 81 11/20/22 1632  Resp 18 11/20/22 1632  SpO2 99 % 11/20/22 1632  Vitals shown include unfiled device data.  Last Pain:  Vitals:   11/20/22 1147  PainSc: 0-No pain         Complications: No notable events documented.

## 2022-11-21 NOTE — Anesthesia Postprocedure Evaluation (Signed)
Anesthesia Post Note  Patient: Lori David  Procedure(s) Performed: ROBOTIC ASSISTED NAVIGATIONAL BRONCHOSCOPY (Left) BRONCHIAL BIOPSIES FIDUCIAL MARKER PLACEMENT     Patient location during evaluation: PACU Anesthesia Type: General Level of consciousness: awake Pain management: pain level controlled Vital Signs Assessment: post-procedure vital signs reviewed and stable Respiratory status: spontaneous breathing, nonlabored ventilation and respiratory function stable Cardiovascular status: blood pressure returned to baseline and stable Postop Assessment: no apparent nausea or vomiting Anesthetic complications: no   No notable events documented.  Last Vitals:  Vitals:   11/20/22 1700 11/20/22 1715  BP: 136/69 (!) 150/75  Pulse: 87 79  Resp: 11 15  Temp:  37.1 C  SpO2: 96% 97%    Last Pain:  Vitals:   11/20/22 1715  PainSc: 0-No pain                 Tanasha Menees P Arshia Spellman

## 2022-11-26 ENCOUNTER — Encounter (HOSPITAL_COMMUNITY): Payer: Self-pay | Admitting: Pulmonary Disease

## 2022-11-26 LAB — CYTOLOGY - NON PAP

## 2022-11-28 ENCOUNTER — Ambulatory Visit: Payer: Medicare HMO | Admitting: Acute Care

## 2022-11-28 ENCOUNTER — Telehealth: Payer: Self-pay | Admitting: Radiation Oncology

## 2022-11-28 ENCOUNTER — Telehealth: Payer: Self-pay | Admitting: Pulmonary Disease

## 2022-11-28 ENCOUNTER — Encounter: Payer: Self-pay | Admitting: Acute Care

## 2022-11-28 VITALS — BP 146/82 | HR 86 | Ht 63.0 in | Wt 143.2 lb

## 2022-11-28 DIAGNOSIS — Z87891 Personal history of nicotine dependence: Secondary | ICD-10-CM

## 2022-11-28 DIAGNOSIS — C3411 Malignant neoplasm of upper lobe, right bronchus or lung: Secondary | ICD-10-CM

## 2022-11-28 DIAGNOSIS — Z801 Family history of malignant neoplasm of trachea, bronchus and lung: Secondary | ICD-10-CM | POA: Diagnosis not present

## 2022-11-28 DIAGNOSIS — Z9889 Other specified postprocedural states: Secondary | ICD-10-CM

## 2022-11-28 DIAGNOSIS — D34 Benign neoplasm of thyroid gland: Secondary | ICD-10-CM

## 2022-11-28 NOTE — Progress Notes (Signed)
History of Present Illness Lori David is a 88 y.o. female with left upper lobe pulmonary nodule , and PMH of arthritis, atrial flutter, gastroesophageal reflux, hypercholesterolemia, hypertension. She was referred to see Dr. Tonia Brooms September 2024 for consideration of biopsy of the pulmonary nodule.  Synopsis 88 year old female, past medical history of arthritis, atrial flutter, gastroesophageal reflux, hypercholesterolemia, hypertension. Patient had a CT scan of the chest which found a left upper lobe pulmonary nodule. She was seen by Dr. [name] in pulmonary clinic and sent for nuclear medicine PET scan. Nuclear medicine pet imaging did not have any significant uptake within the lesion and due to its size location and morphology it still concerning for an indolent carcinoma. And so she was referred here for consideration of bronchoscopy and biopsy. She has talked with her primary care provider as well as family and Dr. [name] and she feels like she would like to proceed with biopsy versus watchful waiting with repeat imaging. She was seen by Dr. Tonia Brooms 10/26/2022. She underwent Flexible video fiberoptic bronchoscopy with robotic assistance and biopsies on 11/20/2022. She presents today for post procedure follow up and to review cytology.    11/28/2022 Pt. Presents for follow up after Flexible video fiberoptic bronchoscopy with robotic assistance and biopsies on 11/20/2022. She states she has done well since the procedure. No bleeding, infection, fever, or adverse reaction to anesthesia.She did have a sore throat for a day or two.  We have discussed her cytology. It was positive for adenocarcinoma.I have referred her to radiation oncology and medical oncology. She is 53 and we did discuss surgery. She did not want a referral for surgery until she has seen rad onc and medical onc.  I have also reviewed her thyroid biopsy results with her as she had not been given the results. The biopsy was benign. Bethesda  category II  Test Results: Cytology 11/20/2022 A. LUNG, RUL NODULE, FINE NEEDLE ASPIRATION AND BX FORCEPS:  - Adenocarcinoma   COMMENT:   Dr. [place] Callas peer reviewed the case and agrees with the interpretation.   10/17/2022 Clinical History: 2.4 cm RLL, 1.8 x 2.2 cm; TI-RADS risk category: TR4  (4-6 pts.)  Specimen Submitted:  B. THYROID, RLL, FINE NEEDLE ASPIRATION:    FINAL MICROSCOPIC DIAGNOSIS:  - Benign follicular nodule (Bethesda category II)   Super D CT Chest 11/14/2022 Stable part solid nodule of the right upper lobe, remains concerning for indolent primary lung adenocarcinoma. Correlate with biopsy results. Stable small solid nodules of the left lower lobe measuring 4 mm. Recommend attention on follow-up. Coronary artery calcifications and aortic Atherosclerosis (ICD10-I70.0). Cardiomegaly.  PET Scan 09/20/2022  Spiculated apical segment right upper lobe nodule measures 1.5 x 2.0 cm (203/39), SUV max 1.9. No additional abnormal hypermetabolism.      Latest Ref Rng & Units 11/20/2022   11:40 AM 03/23/2014    4:42 AM 02/09/2014    3:11 PM  CBC  WBC 4.0 - 10.5 K/uL 5.8  4.8    Hemoglobin 12.0 - 15.0 g/dL 21.3  08.6  57.8   Hematocrit 36.0 - 46.0 % 44.1  40.2  46.0   Platelets 150 - 400 K/uL 230  229         Latest Ref Rng & Units 11/20/2022   11:40 AM 03/23/2014    4:42 AM 02/09/2014    3:11 PM  BMP  Glucose 70 - 99 mg/dL 469  93  80   BUN 8 - 23 mg/dL 13  8  10  Creatinine 0.44 - 1.00 mg/dL 1.61  0.96  0.45   Sodium 135 - 145 mmol/L 135  140  139   Potassium 3.5 - 5.1 mmol/L 3.9  3.6  3.6   Chloride 98 - 111 mmol/L 104  110  103   CO2 22 - 32 mmol/L 20  24    Calcium 8.9 - 10.3 mg/dL 9.0  8.7      BNP No results found for: "BNP"  ProBNP No results found for: "PROBNP"  PFT No results found for: "FEV1PRE", "FEV1POST", "FVCPRE", "FVCPOST", "TLC", "DLCOUNC", "PREFEV1FVCRT", "PSTFEV1FVCRT"  CT Super D Chest Wo Contrast  Result Date: 11/26/2022 CLINICAL  DATA:  Follow-up lung nodule EXAM: CT CHEST WITHOUT CONTRAST TECHNIQUE: Multidetector CT imaging of the chest was performed using thin slice collimation for electromagnetic bronchoscopy planning purposes, without intravenous contrast. RADIATION DOSE REDUCTION: This exam was performed according to the departmental dose-optimization program which includes automated exposure control, adjustment of the mA and/or kV according to patient size and/or use of iterative reconstruction technique. COMPARISON:  PET-CT dated September 20, 2022; chest CT dated July 30, 2022 FINDINGS: Cardiovascular: Cardiomegaly. No No pericardial effusion. Normal caliber thoracic aorta with moderate calcified plaque. Moderate coronary artery calcifications. Mediastinum/Nodes: Esophagus and thyroid are unremarkable. No enlarged lymph nodes seen in the chest. Lungs/Pleura: Central airways are patent. Part solid nodule of the right upper lobe measuring 2.3 x 1.7 cm in overall diameter with solid component measuring 1.7 x 0.9 cm, unchanged when compared with the prior exam. Stable small solid nodules of the left lower lobe measuring 4 mm on series 4, image 101 and 4 mm on image 59. Upper Abdomen: Small low-attenuation lesion of the left lobe of the liver, unchanged compared with the prior and likely a simple cysts. Bilateral adrenal gland adenomas, unchanged, no specific follow-up imaging is necessary. Musculoskeletal: No chest wall mass or suspicious bone lesions identified. IMPRESSION: 1. Stable part solid nodule of the right upper lobe, remains concerning for indolent primary lung adenocarcinoma. Correlate with biopsy results. 2. Stable small solid nodules of the left lower lobe measuring 4 mm. Recommend attention on follow-up. 3. Coronary artery calcifications and aortic Atherosclerosis (ICD10-I70.0). 4. Cardiomegaly. Electronically Signed   By: Allegra Lai M.D.   On: 11/26/2022 15:49   DG Chest Port 1 View  Result Date: 11/20/2022 CLINICAL  DATA:  Status post bronchoscopy with biopsy. EXAM: PORTABLE CHEST 1 VIEW COMPARISON:  July 27, 2022. FINDINGS: Stable cardiomegaly. No pneumothorax or pleural effusion is noted. Biopsy clip is seen in right upper lobe near probable right apical nodule. Bony thorax is unremarkable. IMPRESSION: No pneumothorax or pleural effusion status post bronchoscopy with biopsy. Electronically Signed   By: Lupita Raider M.D.   On: 11/20/2022 18:36   DG C-ARM BRONCHOSCOPY  Result Date: 11/20/2022 C-ARM BRONCHOSCOPY: Fluoroscopy was utilized by the requesting physician.  No radiographic interpretation.     Past medical hx Past Medical History:  Diagnosis Date   Arthritis    "in my back/gastro dr" (03/22/2014)   Atrial flutter (HCC)    a. Pt reports episode 02/2013 noted pre-op cataracts but was in NSR at time of f/u. Saw cardiology and was told she had a leaky heart valve.   Diabetes mellitus without complication (HCC)    type 2   Dyspnea    occasional   Family history of adverse reaction to anesthesia    "some people in my family have; don't know what happened though" (03/22/2014)   GERD (gastroesophageal reflux  disease)    GI problem    a. Pt reports workup for abdominal sensitivity with unremarkable endoscopy and CT.   Heart valve disease    leaking heart valve   Hypercholesterolemia    Hypertension    Lung nodule 10/2022   upper left lobe   Osteopenia    Seasonal allergies    Valvular heart disease      Social History   Tobacco Use   Smoking status: Former    Current packs/day: 0.00    Average packs/day: 0.8 packs/day for 15.0 years (11.3 ttl pk-yrs)    Types: Cigarettes    Start date: 03/19/1959    Quit date: 03/18/1974    Years since quitting: 48.7   Smokeless tobacco: Never   Tobacco comments:    "quit smoking cigarettes in the 1970's"  Vaping Use   Vaping status: Never Used  Substance Use Topics   Alcohol use: No    Alcohol/week: 0.0 standard drinks of alcohol   Drug use: No     Ms.Salvador reports that she quit smoking about 48 years ago. Her smoking use included cigarettes. She started smoking about 63 years ago. She has a 11.3 pack-year smoking history. She has never used smokeless tobacco. She reports that she does not drink alcohol and does not use drugs.  Tobacco Cessation: Former smoker , quit 1976 with a 15 pack smoking history  Past surgical hx, Family hx, Social hx all reviewed.  Current Outpatient Medications on File Prior to Visit  Medication Sig   acetaminophen (TYLENOL) 650 MG CR tablet Take 650 mg by mouth every 8 (eight) hours as needed for pain.   calcium carbonate (TUMS - DOSED IN MG ELEMENTAL CALCIUM) 500 MG chewable tablet Chew 1 tablet by mouth daily as needed for indigestion or heartburn.   cyanocobalamin (VITAMIN B12) 1000 MCG/ML injection Inject 1,000 mcg into the muscle every 30 (thirty) days.   digoxin (LANOXIN) 0.125 MG tablet Take 1 tablet (0.125 mg total) by mouth daily.   diltiazem (CARDIZEM CD) 120 MG 24 hr capsule TAKE 1 CAPSULE IN THE EVENING.   diltiazem (CARDIZEM CD) 240 MG 24 hr capsule TAKE 1 CAPSULE BY MOUTH EVERY MORNING.   metFORMIN (GLUCOPHAGE) 500 MG tablet Take 500 mg by mouth daily with breakfast.   metoprolol tartrate (LOPRESSOR) 50 MG tablet take (1) tablet twice daily.   sennosides-docusate sodium (SENOKOT-S) 8.6-50 MG tablet Take 1 tablet by mouth 2 (two) times daily.   Vitamin D, Ergocalciferol, (DRISDOL) 50000 UNITS CAPS capsule Take 50,000 Units by mouth every 7 (seven) days. Mondays   warfarin (COUMADIN) 5 MG tablet Take 1 tablet by mouth daily except ONE-HALF (1/2) tablet on Sundays.   No current facility-administered medications on file prior to visit.     Allergies  Allergen Reactions   Chocolate     Numb feeling around mouth    Peanuts [Peanut Oil]     hives   Penicillins     Unknown     Review Of Systems:  Constitutional:   No  weight loss, night sweats,  Fevers, chills, fatigue, or   lassitude.  HEENT:   No headaches,  Difficulty swallowing,  Tooth/dental problems, or  Sore throat,                No sneezing, itching, ear ache, nasal congestion, post nasal drip,   CV:  No chest pain,  Orthopnea, PND, swelling in lower extremities, anasarca, dizziness, palpitations, syncope.   GI  No heartburn, indigestion,  abdominal pain, nausea, vomiting, diarrhea, change in bowel habits, loss of appetite, bloody stools.   Resp: No shortness of breath with exertion or at rest.  No excess mucus, no productive cough,  No non-productive cough,  No coughing up of blood.  No change in color of mucus.  No wheezing.  No chest wall deformity  Skin: no rash or lesions.  GU: no dysuria, change in color of urine, no urgency or frequency.  No flank pain, no hematuria   MS:  No joint pain or swelling.  No decreased range of motion.  No back pain.  Psych:  No change in mood or affect. No depression or anxiety.  No memory loss.   Vital Signs BP (!) 146/82 (BP Location: Right Arm, Cuff Size: Normal)   Pulse 86   Ht 5\' 3"  (1.6 m)   Wt 143 lb 3.2 oz (65 kg)   SpO2 97%   BMI 25.37 kg/m      Physical Exam:  General- No distress,  A&Ox3, pleasant  ENT: No sinus tenderness, TM clear, pale nasal mucosa, no oral exudate,no post nasal drip, no LAN Cardiac: S1, S2, regular rate and rhythm, no murmur Chest: No wheeze/ rales/ dullness; no accessory muscle use, no nasal flaring, no sternal retractions, slightly diminished per bases Abd.: Soft Non-tender, ND, BS +, Body mass index is 25.37 kg/m.  Ext: No clubbing cyanosis, edema Neuro:  normal strength, MAE x 4, A&O x 3 Skin: No rashes, warm and dry, No lesions  Psych: normal mood and behavior   Assessment/Plan New diagnosis adenocarcinoma Former smoker quit in 1976 Family history of cancer Benign  Plan I am glad you have done well after your biopsy. The biopsy is positive for adenocarcinoma. I have placed a referral to radiation  oncology for consultation to discuss treatment.[facility]) I have placed an order for medical oncology for consultation to discuss treatment Jeani Hawking) You will get calls to schedule both of these appointments. Your thyroid biopsy was benign.  This is good news [facility]. Daisey Must with your treatment Please call if you need [name].  Please contact office for sooner follow up if symptoms do not improve or worsen or seek emergency care    I spent 30 minutes dedicated to the care of this patient on the date of this encounter to include pre-visit review of records, face-to-face time with the patient discussing conditions above, post visit ordering of testing, clinical documentation with the electronic health record, making appropriate referrals as documented, and communicating necessary information to the patient's healthcare team.   Bevelyn Ngo, NP 11/28/2022  11:16 AM

## 2022-11-28 NOTE — Patient Instructions (Addendum)
It is good to see you today. I am glad you have done well after your biopsy. The biopsy is positive for adenocarcinoma. I have placed a referral to radiation oncology for consultation to discuss treatment.[facility]) I have placed an order for medical oncology for consultation to discuss treatment Jeani Hawking) You will get calls to schedule both of these appointments. Your thyroid biopsy was benign.  This is good news [facility] care [facility]. Daisey Must with your treatment Please call if you need [name].  Please contact office for sooner follow up if symptoms do not improve or worsen or seek emergency care

## 2022-11-28 NOTE — Telephone Encounter (Signed)
Lori David is it ok to send this to [facility] at [facility]

## 2022-11-28 NOTE — Telephone Encounter (Signed)
Patient would like to receive radion from [facility] in [place] and not [facility]. Please call back [facility] to schedule radiation appointment for patient. Fax [phone]

## 2022-11-28 NOTE — Telephone Encounter (Signed)
Called patient to schedule a consultation w. Dr. Moody. No answer, LVM for a return call.  

## 2022-11-29 ENCOUNTER — Ambulatory Visit: Payer: Medicare HMO | Attending: Internal Medicine | Admitting: *Deleted

## 2022-11-29 ENCOUNTER — Telehealth: Payer: Self-pay | Admitting: Radiation Oncology

## 2022-11-29 DIAGNOSIS — I4891 Unspecified atrial fibrillation: Secondary | ICD-10-CM

## 2022-11-29 DIAGNOSIS — Z5181 Encounter for therapeutic drug level monitoring: Secondary | ICD-10-CM | POA: Diagnosis not present

## 2022-11-29 LAB — POCT INR: INR: 2.6 (ref 2.0–3.0)

## 2022-11-29 NOTE — Telephone Encounter (Signed)
Order faxed to requested number °

## 2022-11-29 NOTE — Patient Instructions (Signed)
Continue warfarin 1 tablet daily except 1/2 tablet only on Sundays Recheck 4 wks

## 2022-11-29 NOTE — Telephone Encounter (Signed)
Referral cancelled. Per 10/9 note patient prefers Radiation in [place], closing referral until further notice.

## 2022-11-30 ENCOUNTER — Other Ambulatory Visit: Payer: Self-pay

## 2022-11-30 NOTE — Progress Notes (Signed)
I reached out to the pt to let her know that she wouldn't be able to receive the recommended SBRT treatment at [facility] that she could receive here. Pt states she will take the time over the weekend to think about whether she wants to receive her radiation here or at [facility]. I provided the pt with my desk number so she can contact me directly next week. Dr.Moody notified via [place] that the pt needs time to decide where she would like to receive her care. Pt is aware she can receive her medical oncology care here as well if she wishes.

## 2022-11-30 NOTE — Progress Notes (Signed)
The proposed treatment discussed in conference is for discussion purpose only and is not a binding recommendation.  The patients have not been physically examined, or presented with their treatment options.  Therefore, final treatment plans cannot be decided.  

## 2022-12-03 ENCOUNTER — Other Ambulatory Visit: Payer: Self-pay | Admitting: Internal Medicine

## 2022-12-07 DIAGNOSIS — C3490 Malignant neoplasm of unspecified part of unspecified bronchus or lung: Secondary | ICD-10-CM | POA: Diagnosis not present

## 2022-12-07 DIAGNOSIS — C3411 Malignant neoplasm of upper lobe, right bronchus or lung: Secondary | ICD-10-CM | POA: Diagnosis not present

## 2022-12-10 NOTE — Progress Notes (Unsigned)
I reached out to pt after reading her consult note from [facility]. I wanted to confirm that she is opting to receive her treatment at that location rather than coming to [facility]. Pt verbalized confirmation that she will be getting her radiation at [facility]. Dr.Moody notified via [place].

## 2022-12-11 ENCOUNTER — Other Ambulatory Visit (HOSPITAL_COMMUNITY): Payer: Self-pay | Admitting: Radiology

## 2022-12-11 DIAGNOSIS — C3490 Malignant neoplasm of unspecified part of unspecified bronchus or lung: Secondary | ICD-10-CM

## 2022-12-11 NOTE — Progress Notes (Signed)
CT complete prior to bronchoscopy   Thanks,  [facility]  Josephine Igo, DO [facility] 12/11/2022 3:25 PM

## 2022-12-11 NOTE — Progress Notes (Signed)
Patient has already established care with oncology at [facility],  BLI  Josephine Igo, DO [facility] 12/11/2022 3:16 PM

## 2022-12-12 ENCOUNTER — Ambulatory Visit (HOSPITAL_COMMUNITY)
Admission: RE | Admit: 2022-12-12 | Discharge: 2022-12-12 | Disposition: A | Discharge status: Home / Self Care | Payer: Medicare HMO | Source: Ambulatory Visit | Attending: Radiation Oncology | Admitting: Radiation Oncology

## 2022-12-12 DIAGNOSIS — C3490 Malignant neoplasm of unspecified part of unspecified bronchus or lung: Secondary | ICD-10-CM | POA: Diagnosis present

## 2022-12-12 DIAGNOSIS — C3411 Malignant neoplasm of upper lobe, right bronchus or lung: Secondary | ICD-10-CM | POA: Insufficient documentation

## 2022-12-12 DIAGNOSIS — Z87891 Personal history of nicotine dependence: Secondary | ICD-10-CM | POA: Insufficient documentation

## 2022-12-12 LAB — PULMONARY FUNCTION TEST
DL/VA % pred: 96 %
DL/VA: 3.93 ml/min/mmHg/L
DLCO unc % pred: 76 %
DLCO unc: 13.57 ml/min/mmHg
FEF 25-75 Post: 2.58 L/s
FEF 25-75 Pre: 1.94 L/s
FEF2575-%Change-Post: 32 %
FEF2575-%Pred-Post: 248 %
FEF2575-%Pred-Pre: 186 %
FEV1-%Change-Post: 5 %
FEV1-%Pred-Post: 113 %
FEV1-%Pred-Pre: 106 %
FEV1-Post: 1.85 L
FEV1-Pre: 1.74 L
FEV1FVC-%Change-Post: 3 %
FEV1FVC-%Pred-Pre: 114 %
FEV6-%Change-Post: 2 %
FEV6-%Pred-Post: 103 %
FEV6-%Pred-Pre: 100 %
FEV6-Post: 2.15 L
FEV6-Pre: 2.1 L
FEV6FVC-%Pred-Post: 106 %
FEV6FVC-%Pred-Pre: 106 %
FVC-%Change-Post: 2 %
FVC-%Pred-Post: 96 %
FVC-%Pred-Pre: 94 %
FVC-Post: 2.15 L
FVC-Pre: 2.1 L
Post FEV1/FVC ratio: 86 %
Post FEV6/FVC ratio: 100 %
Pre FEV1/FVC ratio: 83 %
Pre FEV6/FVC Ratio: 100 %

## 2022-12-12 MED ORDER — ALBUTEROL SULFATE (2.5 MG/3ML) 0.083% IN NEBU
2.5000 mg | INHALATION_SOLUTION | Freq: Once | RESPIRATORY_TRACT | Status: AC
  Administered 2022-12-12: 2.5 mg via RESPIRATORY_TRACT

## 2022-12-18 DIAGNOSIS — Z510 Encounter for antineoplastic radiation therapy: Secondary | ICD-10-CM | POA: Diagnosis not present

## 2022-12-18 DIAGNOSIS — C3411 Malignant neoplasm of upper lobe, right bronchus or lung: Secondary | ICD-10-CM | POA: Diagnosis not present

## 2022-12-18 DIAGNOSIS — I482 Chronic atrial fibrillation, unspecified: Secondary | ICD-10-CM | POA: Diagnosis not present

## 2022-12-18 DIAGNOSIS — Z87891 Personal history of nicotine dependence: Secondary | ICD-10-CM | POA: Diagnosis not present

## 2022-12-18 DIAGNOSIS — E119 Type 2 diabetes mellitus without complications: Secondary | ICD-10-CM | POA: Diagnosis not present

## 2022-12-26 DIAGNOSIS — Z510 Encounter for antineoplastic radiation therapy: Secondary | ICD-10-CM | POA: Diagnosis not present

## 2022-12-26 DIAGNOSIS — Z87891 Personal history of nicotine dependence: Secondary | ICD-10-CM | POA: Diagnosis not present

## 2022-12-26 DIAGNOSIS — E119 Type 2 diabetes mellitus without complications: Secondary | ICD-10-CM | POA: Diagnosis not present

## 2022-12-26 DIAGNOSIS — C3411 Malignant neoplasm of upper lobe, right bronchus or lung: Secondary | ICD-10-CM | POA: Diagnosis not present

## 2022-12-26 DIAGNOSIS — I482 Chronic atrial fibrillation, unspecified: Secondary | ICD-10-CM | POA: Diagnosis not present

## 2022-12-27 ENCOUNTER — Ambulatory Visit: Payer: Medicare HMO | Attending: Cardiology | Admitting: *Deleted

## 2022-12-27 DIAGNOSIS — I4891 Unspecified atrial fibrillation: Secondary | ICD-10-CM

## 2022-12-27 DIAGNOSIS — Z5181 Encounter for therapeutic drug level monitoring: Secondary | ICD-10-CM | POA: Diagnosis not present

## 2022-12-27 LAB — POCT INR: INR: 2.1 (ref 2.0–3.0)

## 2022-12-27 NOTE — Patient Instructions (Signed)
Continue warfarin 1 tablet daily except 1/2 tablet only on Sundays Recheck 4 wks

## 2022-12-28 DIAGNOSIS — C3411 Malignant neoplasm of upper lobe, right bronchus or lung: Secondary | ICD-10-CM | POA: Diagnosis not present

## 2022-12-28 DIAGNOSIS — I482 Chronic atrial fibrillation, unspecified: Secondary | ICD-10-CM | POA: Diagnosis not present

## 2022-12-28 DIAGNOSIS — Z510 Encounter for antineoplastic radiation therapy: Secondary | ICD-10-CM | POA: Diagnosis not present

## 2022-12-28 DIAGNOSIS — E119 Type 2 diabetes mellitus without complications: Secondary | ICD-10-CM | POA: Diagnosis not present

## 2022-12-28 DIAGNOSIS — Z87891 Personal history of nicotine dependence: Secondary | ICD-10-CM | POA: Diagnosis not present

## 2023-01-01 DIAGNOSIS — I482 Chronic atrial fibrillation, unspecified: Secondary | ICD-10-CM | POA: Diagnosis not present

## 2023-01-01 DIAGNOSIS — Z87891 Personal history of nicotine dependence: Secondary | ICD-10-CM | POA: Diagnosis not present

## 2023-01-01 DIAGNOSIS — C3411 Malignant neoplasm of upper lobe, right bronchus or lung: Secondary | ICD-10-CM | POA: Diagnosis not present

## 2023-01-01 DIAGNOSIS — Z510 Encounter for antineoplastic radiation therapy: Secondary | ICD-10-CM | POA: Diagnosis not present

## 2023-01-01 DIAGNOSIS — E119 Type 2 diabetes mellitus without complications: Secondary | ICD-10-CM | POA: Diagnosis not present

## 2023-01-02 ENCOUNTER — Other Ambulatory Visit: Payer: Self-pay | Admitting: Internal Medicine

## 2023-01-02 DIAGNOSIS — I482 Chronic atrial fibrillation, unspecified: Secondary | ICD-10-CM | POA: Diagnosis not present

## 2023-01-02 DIAGNOSIS — C3411 Malignant neoplasm of upper lobe, right bronchus or lung: Secondary | ICD-10-CM | POA: Diagnosis not present

## 2023-01-02 DIAGNOSIS — Z510 Encounter for antineoplastic radiation therapy: Secondary | ICD-10-CM | POA: Diagnosis not present

## 2023-01-02 DIAGNOSIS — E119 Type 2 diabetes mellitus without complications: Secondary | ICD-10-CM | POA: Diagnosis not present

## 2023-01-02 DIAGNOSIS — Z87891 Personal history of nicotine dependence: Secondary | ICD-10-CM | POA: Diagnosis not present

## 2023-01-03 DIAGNOSIS — E119 Type 2 diabetes mellitus without complications: Secondary | ICD-10-CM | POA: Diagnosis not present

## 2023-01-03 DIAGNOSIS — I482 Chronic atrial fibrillation, unspecified: Secondary | ICD-10-CM | POA: Diagnosis not present

## 2023-01-03 DIAGNOSIS — Z510 Encounter for antineoplastic radiation therapy: Secondary | ICD-10-CM | POA: Diagnosis not present

## 2023-01-03 DIAGNOSIS — Z87891 Personal history of nicotine dependence: Secondary | ICD-10-CM | POA: Diagnosis not present

## 2023-01-03 DIAGNOSIS — C3411 Malignant neoplasm of upper lobe, right bronchus or lung: Secondary | ICD-10-CM | POA: Diagnosis not present

## 2023-01-04 DIAGNOSIS — I482 Chronic atrial fibrillation, unspecified: Secondary | ICD-10-CM | POA: Diagnosis not present

## 2023-01-04 DIAGNOSIS — C3411 Malignant neoplasm of upper lobe, right bronchus or lung: Secondary | ICD-10-CM | POA: Diagnosis not present

## 2023-01-04 DIAGNOSIS — Z510 Encounter for antineoplastic radiation therapy: Secondary | ICD-10-CM | POA: Diagnosis not present

## 2023-01-04 DIAGNOSIS — E119 Type 2 diabetes mellitus without complications: Secondary | ICD-10-CM | POA: Diagnosis not present

## 2023-01-04 DIAGNOSIS — Z87891 Personal history of nicotine dependence: Secondary | ICD-10-CM | POA: Diagnosis not present

## 2023-01-07 DIAGNOSIS — I482 Chronic atrial fibrillation, unspecified: Secondary | ICD-10-CM | POA: Diagnosis not present

## 2023-01-07 DIAGNOSIS — Z510 Encounter for antineoplastic radiation therapy: Secondary | ICD-10-CM | POA: Diagnosis not present

## 2023-01-07 DIAGNOSIS — C3411 Malignant neoplasm of upper lobe, right bronchus or lung: Secondary | ICD-10-CM | POA: Diagnosis not present

## 2023-01-07 DIAGNOSIS — E119 Type 2 diabetes mellitus without complications: Secondary | ICD-10-CM | POA: Diagnosis not present

## 2023-01-07 DIAGNOSIS — Z87891 Personal history of nicotine dependence: Secondary | ICD-10-CM | POA: Diagnosis not present

## 2023-01-08 DIAGNOSIS — Z510 Encounter for antineoplastic radiation therapy: Secondary | ICD-10-CM | POA: Diagnosis not present

## 2023-01-08 DIAGNOSIS — E119 Type 2 diabetes mellitus without complications: Secondary | ICD-10-CM | POA: Diagnosis not present

## 2023-01-08 DIAGNOSIS — Z87891 Personal history of nicotine dependence: Secondary | ICD-10-CM | POA: Diagnosis not present

## 2023-01-08 DIAGNOSIS — C3411 Malignant neoplasm of upper lobe, right bronchus or lung: Secondary | ICD-10-CM | POA: Diagnosis not present

## 2023-01-08 DIAGNOSIS — I482 Chronic atrial fibrillation, unspecified: Secondary | ICD-10-CM | POA: Diagnosis not present

## 2023-01-09 DIAGNOSIS — C3411 Malignant neoplasm of upper lobe, right bronchus or lung: Secondary | ICD-10-CM | POA: Diagnosis not present

## 2023-01-09 DIAGNOSIS — Z87891 Personal history of nicotine dependence: Secondary | ICD-10-CM | POA: Diagnosis not present

## 2023-01-09 DIAGNOSIS — Z510 Encounter for antineoplastic radiation therapy: Secondary | ICD-10-CM | POA: Diagnosis not present

## 2023-01-09 DIAGNOSIS — I482 Chronic atrial fibrillation, unspecified: Secondary | ICD-10-CM | POA: Diagnosis not present

## 2023-01-09 DIAGNOSIS — E119 Type 2 diabetes mellitus without complications: Secondary | ICD-10-CM | POA: Diagnosis not present

## 2023-01-10 DIAGNOSIS — C3411 Malignant neoplasm of upper lobe, right bronchus or lung: Secondary | ICD-10-CM | POA: Diagnosis not present

## 2023-01-10 DIAGNOSIS — I482 Chronic atrial fibrillation, unspecified: Secondary | ICD-10-CM | POA: Diagnosis not present

## 2023-01-10 DIAGNOSIS — Z510 Encounter for antineoplastic radiation therapy: Secondary | ICD-10-CM | POA: Diagnosis not present

## 2023-01-10 DIAGNOSIS — E119 Type 2 diabetes mellitus without complications: Secondary | ICD-10-CM | POA: Diagnosis not present

## 2023-01-10 DIAGNOSIS — Z87891 Personal history of nicotine dependence: Secondary | ICD-10-CM | POA: Diagnosis not present

## 2023-01-11 DIAGNOSIS — C3411 Malignant neoplasm of upper lobe, right bronchus or lung: Secondary | ICD-10-CM | POA: Diagnosis not present

## 2023-01-11 DIAGNOSIS — I482 Chronic atrial fibrillation, unspecified: Secondary | ICD-10-CM | POA: Diagnosis not present

## 2023-01-11 DIAGNOSIS — E119 Type 2 diabetes mellitus without complications: Secondary | ICD-10-CM | POA: Diagnosis not present

## 2023-01-11 DIAGNOSIS — Z87891 Personal history of nicotine dependence: Secondary | ICD-10-CM | POA: Diagnosis not present

## 2023-01-11 DIAGNOSIS — Z510 Encounter for antineoplastic radiation therapy: Secondary | ICD-10-CM | POA: Diagnosis not present

## 2023-01-14 DIAGNOSIS — I482 Chronic atrial fibrillation, unspecified: Secondary | ICD-10-CM | POA: Diagnosis not present

## 2023-01-14 DIAGNOSIS — C3411 Malignant neoplasm of upper lobe, right bronchus or lung: Secondary | ICD-10-CM | POA: Diagnosis not present

## 2023-01-14 DIAGNOSIS — Z87891 Personal history of nicotine dependence: Secondary | ICD-10-CM | POA: Diagnosis not present

## 2023-01-14 DIAGNOSIS — E119 Type 2 diabetes mellitus without complications: Secondary | ICD-10-CM | POA: Diagnosis not present

## 2023-01-14 DIAGNOSIS — Z510 Encounter for antineoplastic radiation therapy: Secondary | ICD-10-CM | POA: Diagnosis not present

## 2023-01-24 ENCOUNTER — Ambulatory Visit: Payer: Medicare HMO | Attending: Internal Medicine | Admitting: *Deleted

## 2023-01-24 DIAGNOSIS — Z5181 Encounter for therapeutic drug level monitoring: Secondary | ICD-10-CM | POA: Diagnosis not present

## 2023-01-24 DIAGNOSIS — I4891 Unspecified atrial fibrillation: Secondary | ICD-10-CM | POA: Diagnosis not present

## 2023-01-24 LAB — POCT INR: INR: 2.5 (ref 2.0–3.0)

## 2023-01-24 NOTE — Patient Instructions (Signed)
Continue warfarin 1 tablet daily except 1/2 tablet only on Sundays Recheck 6 wks

## 2023-01-31 ENCOUNTER — Other Ambulatory Visit: Payer: Self-pay | Admitting: Cardiology

## 2023-01-31 ENCOUNTER — Other Ambulatory Visit: Payer: Self-pay | Admitting: Internal Medicine

## 2023-01-31 DIAGNOSIS — I4891 Unspecified atrial fibrillation: Secondary | ICD-10-CM

## 2023-01-31 NOTE — Telephone Encounter (Signed)
Refill request for warfarin:  Last INR was 2.5 on 01/24/23 Next INR due 03/06/22 LOV was 11/01/22  Refill approved.

## 2023-02-26 DIAGNOSIS — C3411 Malignant neoplasm of upper lobe, right bronchus or lung: Secondary | ICD-10-CM | POA: Diagnosis not present

## 2023-03-06 ENCOUNTER — Other Ambulatory Visit: Payer: Self-pay | Admitting: Internal Medicine

## 2023-03-07 ENCOUNTER — Other Ambulatory Visit: Payer: Self-pay | Admitting: Internal Medicine

## 2023-03-07 ENCOUNTER — Ambulatory Visit: Payer: Medicare HMO | Attending: Internal Medicine | Admitting: *Deleted

## 2023-03-07 DIAGNOSIS — I4891 Unspecified atrial fibrillation: Secondary | ICD-10-CM | POA: Diagnosis not present

## 2023-03-07 DIAGNOSIS — Z5181 Encounter for therapeutic drug level monitoring: Secondary | ICD-10-CM | POA: Diagnosis not present

## 2023-03-07 LAB — POCT INR: INR: 1.7 — AB (ref 2.0–3.0)

## 2023-03-07 NOTE — Patient Instructions (Signed)
Take warfarin 1 1/2 tablets tonight then resume 1 tablet daily except 1/2 tablet only on Sundays Recheck 3 wks

## 2023-03-12 DIAGNOSIS — E042 Nontoxic multinodular goiter: Secondary | ICD-10-CM | POA: Diagnosis not present

## 2023-03-12 DIAGNOSIS — C3411 Malignant neoplasm of upper lobe, right bronchus or lung: Secondary | ICD-10-CM | POA: Diagnosis not present

## 2023-03-25 DIAGNOSIS — R059 Cough, unspecified: Secondary | ICD-10-CM | POA: Diagnosis not present

## 2023-03-25 DIAGNOSIS — Z923 Personal history of irradiation: Secondary | ICD-10-CM | POA: Diagnosis not present

## 2023-03-25 DIAGNOSIS — C3411 Malignant neoplasm of upper lobe, right bronchus or lung: Secondary | ICD-10-CM | POA: Diagnosis not present

## 2023-03-25 DIAGNOSIS — J329 Chronic sinusitis, unspecified: Secondary | ICD-10-CM | POA: Diagnosis not present

## 2023-04-02 ENCOUNTER — Ambulatory Visit: Payer: Medicare HMO | Attending: Cardiology | Admitting: *Deleted

## 2023-04-02 DIAGNOSIS — Z5181 Encounter for therapeutic drug level monitoring: Secondary | ICD-10-CM | POA: Diagnosis not present

## 2023-04-02 DIAGNOSIS — I4891 Unspecified atrial fibrillation: Secondary | ICD-10-CM

## 2023-04-02 LAB — POCT INR: INR: 2.5 (ref 2.0–3.0)

## 2023-04-02 NOTE — Patient Instructions (Signed)
Continue warfarin 1 tablet daily except 1/2 tablet only on Sundays Recheck 4 wks

## 2023-04-23 ENCOUNTER — Encounter: Payer: Self-pay | Admitting: Nurse Practitioner

## 2023-04-23 ENCOUNTER — Ambulatory Visit: Payer: Medicare HMO | Attending: Nurse Practitioner | Admitting: Nurse Practitioner

## 2023-04-23 ENCOUNTER — Telehealth: Payer: Self-pay | Admitting: Nurse Practitioner

## 2023-04-23 ENCOUNTER — Ambulatory Visit (INDEPENDENT_AMBULATORY_CARE_PROVIDER_SITE_OTHER): Payer: Medicare HMO | Admitting: *Deleted

## 2023-04-23 ENCOUNTER — Ambulatory Visit

## 2023-04-23 ENCOUNTER — Other Ambulatory Visit: Payer: Self-pay | Admitting: Nurse Practitioner

## 2023-04-23 VITALS — BP 138/78 | HR 103 | Ht 64.0 in | Wt 141.0 lb

## 2023-04-23 DIAGNOSIS — I428 Other cardiomyopathies: Secondary | ICD-10-CM | POA: Diagnosis not present

## 2023-04-23 DIAGNOSIS — I480 Paroxysmal atrial fibrillation: Secondary | ICD-10-CM

## 2023-04-23 DIAGNOSIS — I340 Nonrheumatic mitral (valve) insufficiency: Secondary | ICD-10-CM

## 2023-04-23 DIAGNOSIS — Z5181 Encounter for therapeutic drug level monitoring: Secondary | ICD-10-CM | POA: Diagnosis not present

## 2023-04-23 DIAGNOSIS — I071 Rheumatic tricuspid insufficiency: Secondary | ICD-10-CM

## 2023-04-23 DIAGNOSIS — I10 Essential (primary) hypertension: Secondary | ICD-10-CM

## 2023-04-23 DIAGNOSIS — Z79899 Other long term (current) drug therapy: Secondary | ICD-10-CM | POA: Diagnosis not present

## 2023-04-23 DIAGNOSIS — I4891 Unspecified atrial fibrillation: Secondary | ICD-10-CM

## 2023-04-23 DIAGNOSIS — I77819 Aortic ectasia, unspecified site: Secondary | ICD-10-CM

## 2023-04-23 DIAGNOSIS — I5032 Chronic diastolic (congestive) heart failure: Secondary | ICD-10-CM

## 2023-04-23 DIAGNOSIS — I4821 Permanent atrial fibrillation: Secondary | ICD-10-CM

## 2023-04-23 LAB — POCT INR: INR: 2.3 (ref 2.0–3.0)

## 2023-04-23 NOTE — Patient Instructions (Addendum)
 Medication Instructions:  Your physician recommends that you continue on your current medications as directed. Please refer to the Current Medication list given to you today.  Labwork: In 1 week at [facility]   Testing/Procedures: Your physician has recommended that you wear a Zio monitor.   This monitor is a medical device that records the heart's electrical activity. Doctors most often use these monitors to diagnose arrhythmias. Arrhythmias are problems with the speed or rhythm of the heartbeat. The monitor is a small device applied to your chest. You can wear one while you do your normal daily activities. While wearing this monitor if you have any symptoms to push the button and record what you felt. Once you have worn this monitor for the period of time provider prescribed (for 3 days), you will return the monitor device in the postage paid box. Once it is returned they will download the data collected and provide [name] with a report which the provider will then review and we will call you with those results. Important tips:  Avoid showering during the first 24 hours of wearing the monitor. Avoid excessive sweating to help maximize wear time. Do not submerge the device, no hot tubs, and no swimming pools. Keep any lotions or oils away from the patch. After 24 hours you may shower with the patch on. Take brief showers with your back facing the shower head.  Do not remove patch once it has been placed because that will interrupt data and decrease adhesive wear time. Push the button when you have any symptoms and write down what you were feeling. Once you have completed wearing your monitor, remove and place into box which has postage paid and place in your outgoing mailbox.  If for some reason you have misplaced your box then call our office and we can provide another box and/or mail it off for you.  Follow-Up: Your physician recommends that you schedule a follow-up appointment in: 6-8 weeks    Any Other Special Instructions Will Be Listed Below (If Applicable).  If you need a refill on your cardiac medications before your next appointment, please call your pharmacy.

## 2023-04-23 NOTE — Progress Notes (Unsigned)
 Cardiology Office Note:    Date: 04/23/2023 ID:  Lori David, DOB 1934-07-16, MRN [id number] PCP:  Ignatius Specking, MD [facility] Providers Cardiologist:  Marjo Bicker, MD    Referring MD: Ignatius Specking, MD   CC: Here for follow-up  History of Present Illness:    Lori David is a 89 y.o. female with a PMH of permanent A-fib, hypertension, nonischemic cardiomyopathy, valvular insufficiency, type 2 diabetes, pulmonary nodule, and GERD, who presents today for scheduled office visit.  NST in 2016 was negative for any ischemia, EF 51%.  TTE in 2016 revealed EF 40 to 45%, global hypokinesis and a corner septal wall motion of left ventricle, moderate MR, there was increased thickness of atrial septum that was consistent with lipomatous hypertrophy, no other significant valvular abnormalities.  Repeat TTE in 2017 revealed EF improved to 50 to 55%, mildly calcified aortic valve without stenosis, moderate MR with mild TR, severely dilated left atrium, no other significant valvular abnormalities.  Last seen by Dr. Jenene Slicker on August 07, 2022. Was overall doing well at the time. Recent digoxin level checked prior to office visit was sub-therapeutic.   Our office was contacted on September 17, 2022 by patient noting shortness of breath - see telephone note. Patient reported restarting Digoxin on her own. Vital signs provided by patient were WNL. Denied SOB in office. TTE was arranged and revealed normal EF, BAE, mild MR, mild TR, mildly dilated ascending aorta at 36 mm. Digoxin level order was arranged.   Today she presents for office evaluation. Says she is feeling better and breathing better since she has returned to taking Digoxin on her own again. Says she restarted it as her shortness of breath concerned her greatly, says her shortness of breath almost prompted her to go to the ED. She is being followed by Pulmonology and scheduled for a robotic assisted bronchoscopy for biopsy.  Previous  CT scan of chest found a left upper lobe pulmonary nodule. Denies any chest pain, shortness of breath, palpitations, syncope, presyncope, dizziness, orthopnea, PND, swelling or significant weight changes, acute bleeding, or claudication.  SH: Lives independently, does all her own housework.   Please see the history of present illness.    All other systems reviewed and are negative.  EKGs/Labs/Other Studies Reviewed:    The following studies were reviewed today:   EKG:  EKG is not ordered today.   Echo 09/2022:  1. Left ventricular ejection fraction, by estimation, is 50 to 55%. The  left ventricle has low normal function. The left ventricle has no regional  wall motion abnormalities. Left ventricular diastolic parameters are  indeterminate.   2. Right ventricular systolic function is normal. The right ventricular  size is normal.   3. Left atrial size was moderately dilated.   4. Right atrial size was moderately dilated.   5. The mitral valve is abnormal. Mild mitral valve regurgitation. No  evidence of mitral stenosis.   6. The tricuspid valve is abnormal.   7. The aortic valve is tricuspid. Aortic valve regurgitation is not  visualized. No aortic stenosis is present.   8. Aortic dilatation noted. There is mild dilatation of the ascending  aorta, measuring 36 mm.   Comparison(s): EF 50-55%. Moderate mitral regurgitation. Left atrium was  severely dilated. Mild tricuspid regurgitiation.   2D Echocardiogram on 02/16/2016: - Left ventricle: The cavity size was normal. Wall thickness was    normal. Systolic function was normal. The estimated ejection    fraction  was in the range of 50% to 55%. Wall motion was normal;    there were no regional wall motion abnormalities. The study is    not technically sufficient to allow evaluation of LV diastolic    function.  - Aortic valve: Trileaflet; mildly calcified leaflets. There was no    stenosis.  - Mitral valve: There was moderate  regurgitation.  - Left atrium: The atrium was severely dilated.  - Tricuspid valve: There was mild regurgitation.   Lexiscan on 03/23/2014: Normal stress nuclear study with no chest pain, no ST changes and no evidence of ischemia or infarction in any vascular territory. The gated ejection fraction was 51% and the wall motion was normal.  Recent Labs: 11/20/2022: BUN 13; Creatinine, Ser 0.85; Hemoglobin 13.9; Platelets 230; Potassium 3.9; Sodium 135  Recent Lipid Panel    Component Value Date/Time   CHOL 138 03/23/2014 0442   TRIG 99 03/23/2014 0442   HDL 40 03/23/2014 0442   CHOLHDL 3.5 03/23/2014 0442   VLDL 20 03/23/2014 0442   LDLCALC 78 03/23/2014 0442     Risk Assessment/Calculations:    CHA2DS2-VASc Score =    This indicates a  % annual risk of stroke. The patient's score is based upon:      Physical Exam:    VS:  There were no vitals taken for this visit.    Wt Readings from Last 3 Encounters:  11/28/22 143 lb 3.2 oz (65 kg)  11/20/22 145 lb (65.8 kg)  11/01/22 143 lb 12.8 oz (65.2 kg)     GEN: Thin 89 y.o. female in no acute distress HEENT: Normal NECK: No JVD; No carotid bruits CARDIAC: S1/S2, irregularly irregular, no murmurs, rubs, gallops; 2+ peripheral pulses throughout, strong and equal bilaterally RESPIRATORY:  Clear and diminished to auscultation without rales, wheezing or rhonchi  MUSCULOSKELETAL:  No edema; No deformity  SKIN: Warm and dry NEUROLOGIC:  Alert and oriented x 3 PSYCHIATRIC:  Normal affect   ASSESSMENT:    No diagnosis found.   PLAN:    In order of problems listed above:  Permanent A-fib, long term anticoagulation, digoxin therapy monitoring, medication management Denies any palpitations or tachycardia. Heart rate well controlled. Patient restarted Digoxin on her own, will repeat Digoxin level at this time. Currently tolerating medications well.  Continue digoxin, Cardizem, Lopressor, and Coumadin.  CHA2DS2-VASc score 5.   Continue to follow-up with Coumadin clinic.  2. History of nonischemic cardiomyopathy, HFimpEF EF mildly reduced in 2016 at 40-45%, recent EF 50-55%. No RWMA's.  Euvolemic and well compensated on exam. Continue current medication regimen. Heart healthy diet, low salt diet and regular cardiovascular exercise encouraged.   3. Mitral valve regurgitation, mild TR Echocardiogram in 2017 revealed moderate MR with mild TR, recent Echo revealed mild MR with mild TR. Heart healthy diet and regular cardiovascular exercise encouraged. Recommend updating Echo in 5 years or sooner if clinically indicated.   4. HTN BP today stable. BP well controlled at home. Continue current medication regimen. Heart healthy diet and regular cardiovascular exercise encouraged.   5. Aortic dilatation Mild dilatation of ascending aorta noted on recent echocardiogram, measuring 36 mm.  Will continue to monitor over time.   6. Pre-op cardiovascular risk assessment [id number]} Ms. Steidle's perioperative risk of a major cardiac event is  % according to the Revised Cardiac Risk Index (RCRI).  Therefore, she is at high risk for perioperative complications.   Her functional capacity is excellent at   METs according to the [facility]  Activity Status Index (DASI). Recommendations: According to ACC/AHA guidelines, no further cardiovascular testing needed.  The patient may proceed to surgery at acceptable risk.   Antiplatelet and/or Anticoagulation Recommendations: Per office protocol, patient can hold warfarin for 5 days prior to procedure.  Patient will not need bridging with Lovenox (enoxaparin) around procedure. Will route note to requesting party.  7. Disposition: Follow-up with Dr. Jenene Slicker or APP  in 6 months or sooner if anything changes.     Medication Adjustments/Labs and Tests Ordered: Current medicines are reviewed at length with the patient today.  Concerns regarding medicines are outlined above.  No orders of the defined  types were placed in this encounter.  No orders of the defined types were placed in this encounter.   There are no Patient Instructions on file for this visit.   SignedSharlene Dory, NP  04/23/2023 12:57 PM    [facility]

## 2023-04-23 NOTE — Patient Instructions (Signed)
 Continue warfarin 1 tablet daily except 1/2 tablet only on Sundays Recheck 6 wks

## 2023-04-23 NOTE — Telephone Encounter (Signed)
 Checking percert on the following patient for  3 day zio XT

## 2023-04-26 DIAGNOSIS — I4891 Unspecified atrial fibrillation: Secondary | ICD-10-CM | POA: Diagnosis not present

## 2023-05-07 DIAGNOSIS — I480 Paroxysmal atrial fibrillation: Secondary | ICD-10-CM | POA: Diagnosis not present

## 2023-06-03 ENCOUNTER — Other Ambulatory Visit: Payer: Self-pay | Admitting: Nurse Practitioner

## 2023-06-04 ENCOUNTER — Ambulatory Visit: Admitting: Nurse Practitioner

## 2023-06-04 ENCOUNTER — Encounter: Payer: Self-pay | Admitting: Nurse Practitioner

## 2023-06-04 ENCOUNTER — Other Ambulatory Visit: Payer: Self-pay | Admitting: Internal Medicine

## 2023-06-04 ENCOUNTER — Ambulatory Visit: Attending: Cardiology | Admitting: *Deleted

## 2023-06-04 VITALS — BP 118/64 | HR 65 | Ht 64.0 in | Wt 141.0 lb

## 2023-06-04 DIAGNOSIS — I10 Essential (primary) hypertension: Secondary | ICD-10-CM

## 2023-06-04 DIAGNOSIS — I071 Rheumatic tricuspid insufficiency: Secondary | ICD-10-CM

## 2023-06-04 DIAGNOSIS — I4821 Permanent atrial fibrillation: Secondary | ICD-10-CM

## 2023-06-04 DIAGNOSIS — I5032 Chronic diastolic (congestive) heart failure: Secondary | ICD-10-CM | POA: Diagnosis not present

## 2023-06-04 DIAGNOSIS — I77819 Aortic ectasia, unspecified site: Secondary | ICD-10-CM

## 2023-06-04 DIAGNOSIS — I4891 Unspecified atrial fibrillation: Secondary | ICD-10-CM | POA: Diagnosis not present

## 2023-06-04 DIAGNOSIS — R0789 Other chest pain: Secondary | ICD-10-CM

## 2023-06-04 DIAGNOSIS — I340 Nonrheumatic mitral (valve) insufficiency: Secondary | ICD-10-CM | POA: Diagnosis not present

## 2023-06-04 DIAGNOSIS — Z5181 Encounter for therapeutic drug level monitoring: Secondary | ICD-10-CM | POA: Diagnosis not present

## 2023-06-04 DIAGNOSIS — I428 Other cardiomyopathies: Secondary | ICD-10-CM

## 2023-06-04 LAB — POCT INR: INR: 2.2 (ref 2.0–3.0)

## 2023-06-04 NOTE — Progress Notes (Unsigned)
 Cardiology Office Note:    Date: 06/04/2023 ID:  [name], DOB [birth year], MRN [id number] PCP:  Ignatius Specking, MD [facility] Providers Cardiologist:  Marjo Bicker, MD    Referring MD: Ignatius Specking, MD   CC: Here for follow-up  History of Present Illness:    Lori David is a 89 y.o. female with a PMH of permanent A-fib, hypertension, nonischemic cardiomyopathy, valvular insufficiency, type 2 diabetes, pulmonary nodule, and GERD, who presents today for scheduled office visit.  NST in 2016 was negative for any ischemia, EF 51%.  TTE in 2016 revealed EF 40 to 45%, global hypokinesis and a corner septal wall motion of left ventricle, moderate MR, there was increased thickness of atrial septum that was consistent with lipomatous hypertrophy, no other significant valvular abnormalities.  Repeat TTE in 2017 revealed EF improved to 50 to 55%, mildly calcified aortic valve without stenosis, moderate MR with mild TR, severely dilated left atrium, no other significant valvular abnormalities.  Last seen by Dr. Jenene Slicker on August 07, 2022. Was overall doing well at the time. Recent digoxin level checked prior to office visit was sub-therapeutic.   Our office was contacted on September 17, 2022 by patient noting shortness of breath - see telephone note. Patient reported restarting Digoxin on her own. Vital signs provided by patient were WNL. Denied SOB in office. TTE was arranged and revealed normal EF, BAE, mild MR, mild TR, mildly dilated ascending aorta at 36 mm. Digoxin level order was arranged.   I last saw her on November 01, 2022 for follow-up.  She was feeling better and breathing better since she returned to taking digoxin on her own.  She had restarted it as her shortness of breath concerned her greatly, her shortness of breath almost prompted her to go to the ED. Followed by Pulmonology and was scheduled for a robotic assisted bronchoscopy for biopsy.  Previous CT scan of chest  found a left upper lobe pulmonary nodule. Denied any chest pain, shortness of breath, palpitations, syncope, presyncope, dizziness, orthopnea, PND, swelling or significant weight changes, acute bleeding, or claudication.  Underwent robotic assisted navigational bronchoscopy of left lung on November 20, 2022. Biopsies were taken.  Cytology revealed positive adenocarcinoma, was referred to radiation oncology and medical oncology.   04/23/2023 - Today she presents for follow-up.  Doing well. Denies any chest pain, shortness of breath, palpitations, syncope, presyncope, dizziness, orthopnea, PND, swelling or significant weight changes, acute bleeding, or claudication.  She has not had her digoxin level checked since last office visit even though this lab work was ordered.   06/04/2023 - She presents today for follow-up. Doing well. Went over/reviewed past monitor results. Says she noticed symptoms of palpitations on April 25, 2023 in the morning. Says she occasionally has moments where it feels as though she is "not getting enough air," takes a deeper breath. Denies any chest pain, syncope, presyncope, dizziness, orthopnea, PND, swelling or significant weight changes, acute bleeding, or claudication. Tells me she hasn't increased Lopressor as previously instructed. Admits to some atypical, intermittent CP at times, says she has acid reflux and attributes this to that, says Tums relieves her symptoms.   SH: Lives independently, does all her own housework.   Please see the history of present illness.    All other systems reviewed and are negative.  EKGs/Labs/Other Studies Reviewed:    The following studies were reviewed today:   EKG: EKG is not ordered today.   Cardiac monitor 04/2023:  Patch wear time was for 9 days and 8 hours.   100% atrial fibrillation burden, ranging from 40 to 147 bpm with an average HR 87 bpm.   76 runs of NSVT, fastest interval lasting 5 beats and the longest interval lasting 7  beats.   3 pauses occurred, longest lasting 3.1 seconds.  No evidence of high-grade AV block.   <1% PAC burden and 1.1% PVC burden.   No patient triggered events noted.  Echo 09/2022:  1. Left ventricular ejection fraction, by estimation, is 50 to 55%. The  left ventricle has low normal function. The left ventricle has no regional  wall motion abnormalities. Left ventricular diastolic parameters are  indeterminate.   2. Right ventricular systolic function is normal. The right ventricular  size is normal.   3. Left atrial size was moderately dilated.   4. Right atrial size was moderately dilated.   5. The mitral valve is abnormal. Mild mitral valve regurgitation. No  evidence of mitral stenosis.   6. The tricuspid valve is abnormal.   7. The aortic valve is tricuspid. Aortic valve regurgitation is not  visualized. No aortic stenosis is present.   8. Aortic dilatation noted. There is mild dilatation of the ascending  aorta, measuring 36 mm.   Comparison(s): EF 50-55%. Moderate mitral regurgitation. Left atrium was  severely dilated. Mild tricuspid regurgitiation.   2D Echocardiogram on 02/16/2016: - Left ventricle: The cavity size was normal. Wall thickness was    normal. Systolic function was normal. The estimated ejection    fraction was in the range of 50% to 55%. Wall motion was normal;    there were no regional wall motion abnormalities. The study is    not technically sufficient to allow evaluation of LV diastolic    function.  - Aortic valve: Trileaflet; mildly calcified leaflets. There was no    stenosis.  - Mitral valve: There was moderate regurgitation.  - Left atrium: The atrium was severely dilated.  - Tricuspid valve: There was mild regurgitation.   Lexiscan on 03/23/2014: Normal stress nuclear study with no chest pain, no ST changes and no evidence of ischemia or infarction in any vascular territory. The gated ejection fraction was 51% and the wall motion was  normal.  Physical Exam:    VS:  BP 118/64   Pulse 65   Ht 5\' 4"  (1.626 m)   Wt 141 lb (64 kg)   SpO2 98%   BMI 24.20 kg/m     Wt Readings from Last 3 Encounters:  06/04/23 141 lb (64 kg)  04/23/23 141 lb (64 kg)  11/28/22 143 lb 3.2 oz (65 kg)     GEN: Thin 89 y.o. female in no acute distress HEENT: Normal NECK: No JVD; No carotid bruits CARDIAC: S1/S2, irregularly irregular rhythm, no murmurs, rubs, gallops; 2+ peripheral pulses throughout, strong and equal bilaterally RESPIRATORY:  Clear and diminished to auscultation without rales, wheezing or rhonchi  MUSCULOSKELETAL:  No edema; No deformity  SKIN: Warm and dry NEUROLOGIC:  Alert and oriented x 3 PSYCHIATRIC:  Normal affect   ASSESSMENT & PLAN:    In order of problems listed above:  Atypical chest pain Admits to atypical symptoms, intermittent. Attributes this to acid reflux, says this has been ongoing, stable. Takes Tums and relieves symptoms. No indication for ischemic evaluation at this time. Will continue to monitor. Heart healthy diet and regular cardiovascular exercise encouraged. Care and ED precautions discussed.   Permanent A-fib, long term anticoagulation Denies any  recent palpitations or tachycardia, HR well controlled today. See monitor report noted above. Has not increased Lopressor. Will continue the current dose she is taking and will continue to monitor at this time. Medical therapy limited d/t her BP trends. Continue digoxin, Cardizem, Lopressor, and Coumadin.  Did not tolerate coming off Digoxin previously. CHA2DS2-VASc score 5.  Continue to follow-up with Coumadin clinic.  3. History of nonischemic cardiomyopathy, HFimpEF EF mildly reduced in 2016 at 40-45%, recent EF 50-55%. No RWMA's.  Euvolemic and well compensated on exam. Continue current medication regimen. Heart healthy diet, low salt diet and regular cardiovascular exercise encouraged.   4. Mitral valve regurgitation, mild TR Echocardiogram  09/2022 revealed mild MR with mild TR. Heart healthy diet and regular cardiovascular exercise encouraged. Recommend updating Echo in 5 years or sooner if clinically indicated.   5. HTN BP today stable. BP well controlled at home. Continue current medication regimen. Heart healthy diet and regular cardiovascular exercise encouraged.   6. Aortic dilatation Mild dilatation of ascending aorta noted on recent echocardiogram, measuring 36 mm.  Will continue to monitor over time.   7. Disposition: Follow-up with Dr. Mallipeddi or APP  in 6 months or sooner if anything changes.   Medication Adjustments/Labs and Tests Ordered: Current medicines are reviewed at length with the patient today.  Concerns regarding medicines are outlined above.  No orders of the defined types were placed in this encounter.  No orders of the defined types were placed in this encounter.   Patient Instructions  Medication Instructions:  Your physician recommends that you continue on your current medications as directed. Please refer to the Current Medication list given to you today.  Labwork: None   Testing/Procedures: None   Follow-Up: Your physician recommends that you schedule a follow-up appointment in: 6 months   Any Other Special Instructions Will Be Listed Below (If Applicable).  If you need a refill on your cardiac medications before your next appointment, please call your pharmacy.   Signed, Lasalle Pointer, NP

## 2023-06-04 NOTE — Patient Instructions (Signed)
 Continue warfarin 1 tablet daily except 1/2 tablet only on Sundays Recheck 6 wks

## 2023-06-04 NOTE — Patient Instructions (Addendum)

## 2023-06-20 DIAGNOSIS — R918 Other nonspecific abnormal finding of lung field: Secondary | ICD-10-CM | POA: Diagnosis not present

## 2023-06-20 DIAGNOSIS — C3411 Malignant neoplasm of upper lobe, right bronchus or lung: Secondary | ICD-10-CM | POA: Diagnosis not present

## 2023-06-20 DIAGNOSIS — J9811 Atelectasis: Secondary | ICD-10-CM | POA: Diagnosis not present

## 2023-07-04 DIAGNOSIS — Z923 Personal history of irradiation: Secondary | ICD-10-CM | POA: Diagnosis not present

## 2023-07-04 DIAGNOSIS — C3411 Malignant neoplasm of upper lobe, right bronchus or lung: Secondary | ICD-10-CM | POA: Diagnosis not present

## 2023-07-16 ENCOUNTER — Ambulatory Visit: Attending: Family Medicine | Admitting: *Deleted

## 2023-07-16 DIAGNOSIS — I4891 Unspecified atrial fibrillation: Secondary | ICD-10-CM

## 2023-07-16 DIAGNOSIS — Z5181 Encounter for therapeutic drug level monitoring: Secondary | ICD-10-CM

## 2023-07-16 LAB — POCT INR: INR: 2.2 (ref 2.0–3.0)

## 2023-07-16 NOTE — Patient Instructions (Signed)
 Continue warfarin 1 tablet daily except 1/2 tablet only on Sundays Recheck 6 wks

## 2023-08-27 ENCOUNTER — Ambulatory Visit: Attending: Cardiology | Admitting: *Deleted

## 2023-08-27 DIAGNOSIS — I4891 Unspecified atrial fibrillation: Secondary | ICD-10-CM

## 2023-08-27 DIAGNOSIS — Z5181 Encounter for therapeutic drug level monitoring: Secondary | ICD-10-CM

## 2023-08-27 LAB — POCT INR: INR: 2.8 (ref 2.0–3.0)

## 2023-08-27 NOTE — Patient Instructions (Signed)
 Continue warfarin 1 tablet daily except 1/2 tablet only on Sundays Recheck 6 wks

## 2023-09-02 NOTE — Progress Notes (Signed)
Please see anticoagulation encounter.

## 2023-09-06 ENCOUNTER — Other Ambulatory Visit: Payer: Self-pay | Admitting: Internal Medicine

## 2023-10-04 ENCOUNTER — Other Ambulatory Visit: Payer: Self-pay | Admitting: Internal Medicine

## 2023-10-04 DIAGNOSIS — I4891 Unspecified atrial fibrillation: Secondary | ICD-10-CM

## 2023-10-08 ENCOUNTER — Ambulatory Visit: Attending: Cardiology | Admitting: *Deleted

## 2023-10-08 DIAGNOSIS — Z5181 Encounter for therapeutic drug level monitoring: Secondary | ICD-10-CM

## 2023-10-08 DIAGNOSIS — I4891 Unspecified atrial fibrillation: Secondary | ICD-10-CM

## 2023-10-08 LAB — POCT INR: INR: 2.4 (ref 2.0–3.0)

## 2023-10-08 NOTE — Progress Notes (Signed)
 INR 2.4 Please see anticoagulation encounter

## 2023-10-08 NOTE — Patient Instructions (Signed)
 Continue warfarin 1 tablet daily except 1/2 tablet only on Sundays Recheck 6 wks

## 2023-10-11 ENCOUNTER — Encounter: Payer: Self-pay | Admitting: Otolaryngology

## 2023-10-15 ENCOUNTER — Other Ambulatory Visit

## 2023-10-15 ENCOUNTER — Ambulatory Visit
Admission: RE | Admit: 2023-10-15 | Discharge: 2023-10-15 | Disposition: A | Discharge status: Home / Self Care | Source: Ambulatory Visit | Attending: Otolaryngology | Admitting: Otolaryngology

## 2023-10-15 DIAGNOSIS — E041 Nontoxic single thyroid nodule: Secondary | ICD-10-CM

## 2023-10-15 DIAGNOSIS — E049 Nontoxic goiter, unspecified: Secondary | ICD-10-CM | POA: Diagnosis not present

## 2023-11-04 ENCOUNTER — Other Ambulatory Visit: Payer: Self-pay | Admitting: Internal Medicine

## 2023-11-04 DIAGNOSIS — I4891 Unspecified atrial fibrillation: Secondary | ICD-10-CM

## 2023-11-05 NOTE — Telephone Encounter (Signed)
 Refill request for warfarin:  Last INR was 2.4 on 10/08/23 Next INR due 11/19/23 LOV was 06/04/23  Refill approved.

## 2023-11-19 ENCOUNTER — Ambulatory Visit: Attending: Cardiology | Admitting: *Deleted

## 2023-11-19 DIAGNOSIS — Z5181 Encounter for therapeutic drug level monitoring: Secondary | ICD-10-CM

## 2023-11-19 DIAGNOSIS — I4891 Unspecified atrial fibrillation: Secondary | ICD-10-CM | POA: Diagnosis not present

## 2023-11-19 LAB — POCT INR: INR: 2.6 (ref 2.0–3.0)

## 2023-11-19 NOTE — Progress Notes (Signed)
 INR 2.6; Please see anticoagulation encounter

## 2023-11-19 NOTE — Patient Instructions (Signed)
 Continue warfarin 1 tablet daily except 1/2 tablet only on Sundays Recheck 6 wks

## 2023-12-31 ENCOUNTER — Ambulatory Visit: Attending: Cardiology | Admitting: *Deleted

## 2023-12-31 DIAGNOSIS — Z5181 Encounter for therapeutic drug level monitoring: Secondary | ICD-10-CM | POA: Diagnosis not present

## 2023-12-31 DIAGNOSIS — I4891 Unspecified atrial fibrillation: Secondary | ICD-10-CM

## 2023-12-31 LAB — POCT INR: INR: 3.1 — AB (ref 2.0–3.0)

## 2023-12-31 NOTE — Patient Instructions (Signed)
 Take warfarin 1/2 tablet tonight then resume 1 tablet daily except 1/2 tablet only on Sundays Recheck 6 wks

## 2024-01-06 NOTE — Progress Notes (Signed)
 INR 3.1  Please see anticoagulation encounter

## 2024-01-14 DIAGNOSIS — C3411 Malignant neoplasm of upper lobe, right bronchus or lung: Secondary | ICD-10-CM | POA: Diagnosis not present

## 2024-01-14 DIAGNOSIS — C3490 Malignant neoplasm of unspecified part of unspecified bronchus or lung: Secondary | ICD-10-CM | POA: Diagnosis not present

## 2024-01-14 DIAGNOSIS — Z923 Personal history of irradiation: Secondary | ICD-10-CM | POA: Diagnosis not present

## 2024-01-23 DIAGNOSIS — C3411 Malignant neoplasm of upper lobe, right bronchus or lung: Secondary | ICD-10-CM | POA: Diagnosis not present

## 2024-02-04 ENCOUNTER — Ambulatory Visit: Attending: Internal Medicine

## 2024-02-04 DIAGNOSIS — Z5181 Encounter for therapeutic drug level monitoring: Secondary | ICD-10-CM | POA: Diagnosis not present

## 2024-02-04 DIAGNOSIS — I4891 Unspecified atrial fibrillation: Secondary | ICD-10-CM

## 2024-02-04 LAB — POCT INR: INR: 4.4 — AB (ref 2.0–3.0)

## 2024-02-04 NOTE — Progress Notes (Signed)
 INR 4.4 Please see anticoagulation encounter

## 2024-02-04 NOTE — Patient Instructions (Signed)
 Hold warfarin tonight then decrease dose to 1 tablet daily except 1/2 tablet only on Sundays and Wednesdays Recheck 3 wks

## 2024-02-11 ENCOUNTER — Ambulatory Visit

## 2024-02-25 ENCOUNTER — Ambulatory Visit: Attending: Internal Medicine | Admitting: *Deleted

## 2024-02-25 DIAGNOSIS — I4891 Unspecified atrial fibrillation: Secondary | ICD-10-CM | POA: Diagnosis not present

## 2024-02-25 DIAGNOSIS — Z5181 Encounter for therapeutic drug level monitoring: Secondary | ICD-10-CM | POA: Diagnosis not present

## 2024-02-25 LAB — POCT INR: INR: 3.3 — AB (ref 2.0–3.0)

## 2024-02-25 NOTE — Progress Notes (Signed)
 INR 3.3; Please see anticoagulation encounter

## 2024-02-25 NOTE — Patient Instructions (Signed)
 Decrease dose to 1 tablet daily except 1/2 tablet only on Sundays,Tuesdays and Thursdays Recheck 4 wks

## 2024-05-26 ENCOUNTER — Ambulatory Visit: Admitting: Nurse Practitioner
